# Patient Record
Sex: Male | Born: 1955
Health system: Southern US, Community
[De-identification: ages and names within clinical notes are randomized; demographics above are authoritative.]

## PROBLEM LIST (undated history)

## (undated) DIAGNOSIS — B059 Measles without complication: Secondary | ICD-10-CM

## (undated) DIAGNOSIS — E785 Hyperlipidemia, unspecified: Secondary | ICD-10-CM

## (undated) DIAGNOSIS — G610 Guillain-Barre syndrome: Secondary | ICD-10-CM

## (undated) HISTORY — PX: WRIST RECONSTRUCTION: SHX2675

## (undated) HISTORY — PX: ANKLE RECONSTRUCTION: SHX1151

## (undated) HISTORY — PX: NOSE SURGERY: SHX723

## (undated) HISTORY — PX: CHOLECYSTECTOMY: SHX55

## (undated) HISTORY — DX: Hyperlipidemia, unspecified: E78.5

## (undated) HISTORY — DX: Measles without complication: B05.9

---

## 1998-03-04 ENCOUNTER — Emergency Department (HOSPITAL_COMMUNITY): Admission: EM | Admit: 1998-03-04 | Discharge: 1998-03-04 | Payer: Self-pay | Admitting: Emergency Medicine

## 1998-03-06 ENCOUNTER — Emergency Department (HOSPITAL_COMMUNITY): Admission: EM | Admit: 1998-03-06 | Discharge: 1998-03-06 | Payer: Self-pay | Admitting: Emergency Medicine

## 2000-01-17 ENCOUNTER — Encounter: Payer: Self-pay | Admitting: Internal Medicine

## 2000-01-17 ENCOUNTER — Inpatient Hospital Stay (HOSPITAL_COMMUNITY): Admission: EM | Admit: 2000-01-17 | Discharge: 2000-01-22 | Payer: Self-pay | Admitting: Emergency Medicine

## 2000-01-17 ENCOUNTER — Encounter: Payer: Self-pay | Admitting: *Deleted

## 2000-01-19 ENCOUNTER — Encounter: Payer: Self-pay | Admitting: General Surgery

## 2000-01-21 ENCOUNTER — Encounter: Payer: Self-pay | Admitting: Gastroenterology

## 2004-06-30 ENCOUNTER — Emergency Department (HOSPITAL_COMMUNITY): Admission: EM | Admit: 2004-06-30 | Discharge: 2004-06-30 | Payer: Self-pay | Admitting: Family Medicine

## 2007-02-25 ENCOUNTER — Emergency Department (HOSPITAL_COMMUNITY): Admission: EM | Admit: 2007-02-25 | Discharge: 2007-02-25 | Payer: Self-pay | Admitting: Emergency Medicine

## 2008-03-23 ENCOUNTER — Emergency Department (HOSPITAL_COMMUNITY): Admission: EM | Admit: 2008-03-23 | Discharge: 2008-03-23 | Payer: Self-pay | Admitting: Emergency Medicine
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
## 2008-03-26 ENCOUNTER — Ambulatory Visit (HOSPITAL_COMMUNITY): Admission: RE | Admit: 2008-03-26 | Discharge: 2008-03-26 | Payer: Self-pay | Admitting: Orthopedic Surgery
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
## 2008-09-28 ENCOUNTER — Inpatient Hospital Stay (HOSPITAL_COMMUNITY): Admission: EM | Admit: 2008-09-28 | Discharge: 2008-09-30 | Payer: Self-pay | Admitting: Emergency Medicine

## 2008-09-28 IMAGING — CR DG TIBIA/FIBULA 2V*R*
4 series · 4 of 4 positions shown · non-contrast
Comparison: None

CLINICAL DATA: Fall, leg injury

RIGHT TIBIA AND FIBULA - 2 VIEW

[t tib/fib ap right (1 of 3)]
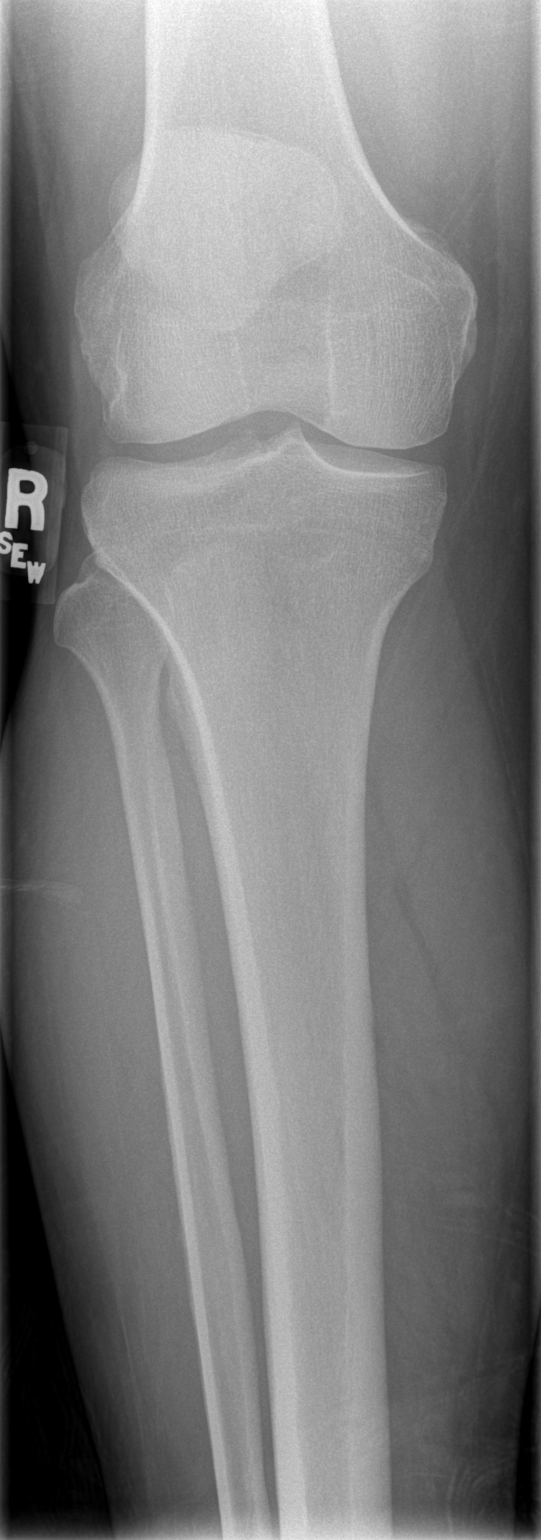

[t tib/fib ap right (2 of 3)]
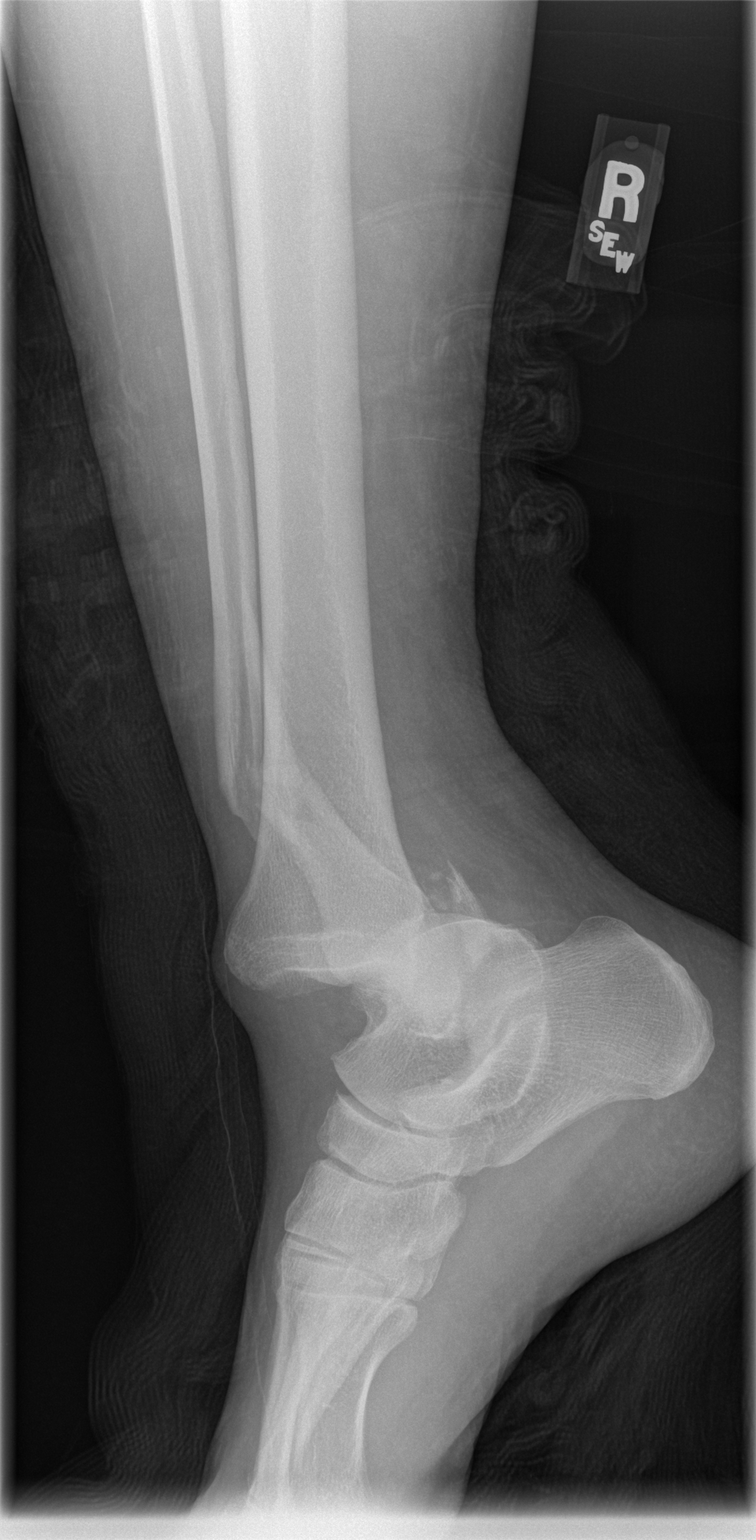

[t tib/fib ap right (3 of 3)]
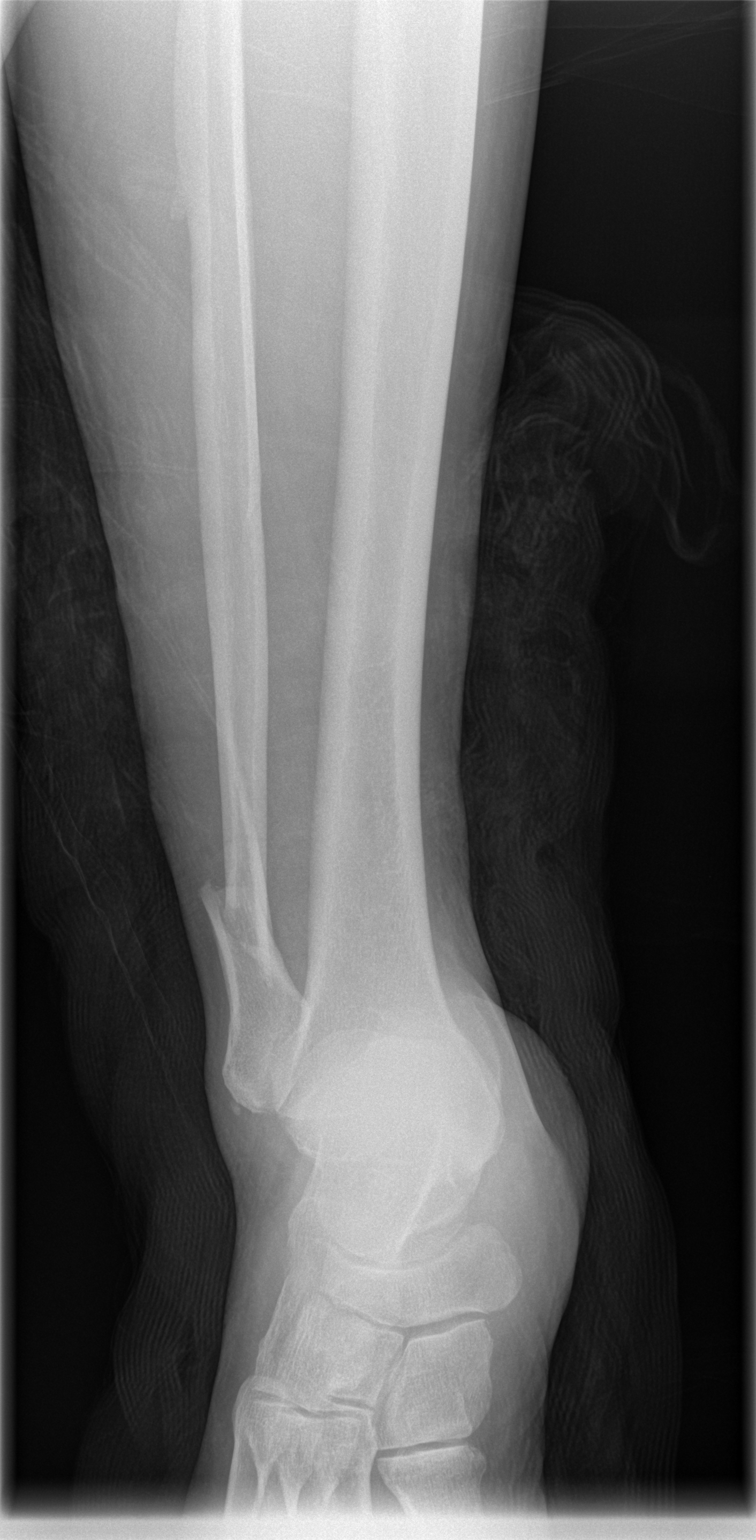

[t tib/fib lat right]
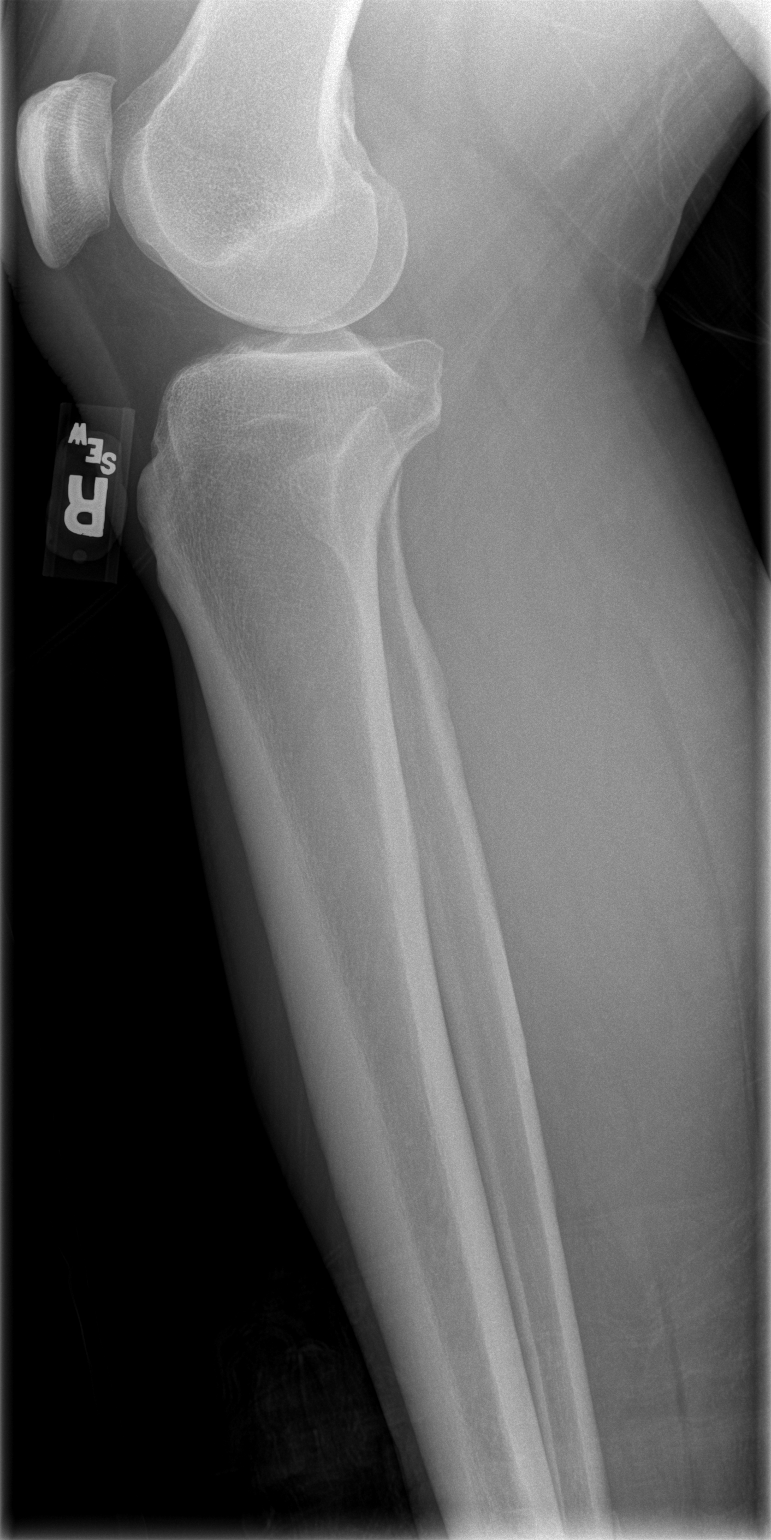

[4 of 4 positions shown; findings below may reference images not displayed]

FINDINGS: Knee joint alignment normal.
Fracture dislocation right ankle.
Displaced posterior malleolar fracture.
Oblique distal fibular diaphyseal fracture with apex anterior and
lateral angulation.
Medial malleolus suboptimally visualized, no gross fracture seen.
Posterior talar dislocation.
Proximal tibia and fibula intact.
IMPRESSION: Distal fibular and posterior malleolar fractures with posterior
dislocation of talus.
No definite medial malleolar fracture is identified, though medial
malleolus is poorly profiled/visualized on these images.

## 2008-09-28 IMAGING — CR DG ANKLE COMPLETE 3+V*R*
2 series · 2 of 2 positions shown · non-contrast
Comparison: Earlier exam of [DATE]

CLINICAL DATA: Post reduction ankle injury

RIGHT ANKLE - COMPLETE 2 VIEWS

[view not recorded (1 of 2)]
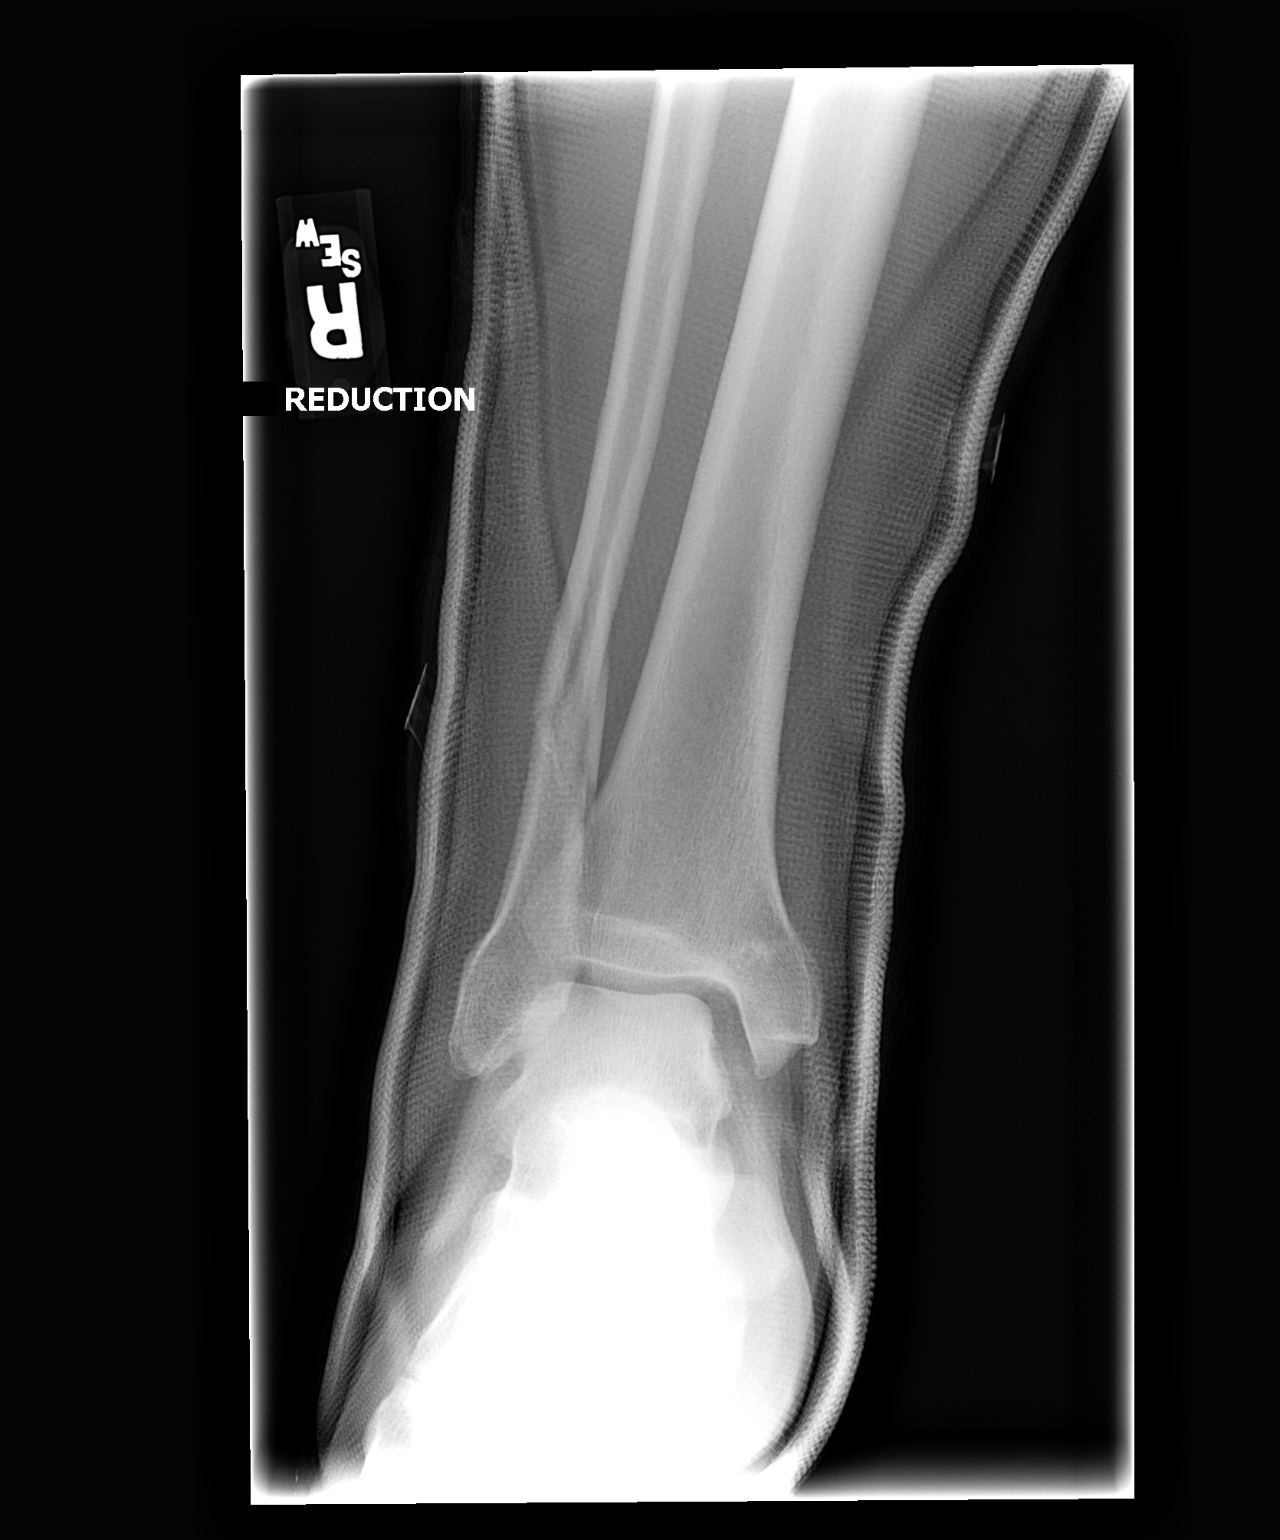

[view not recorded (2 of 2)]
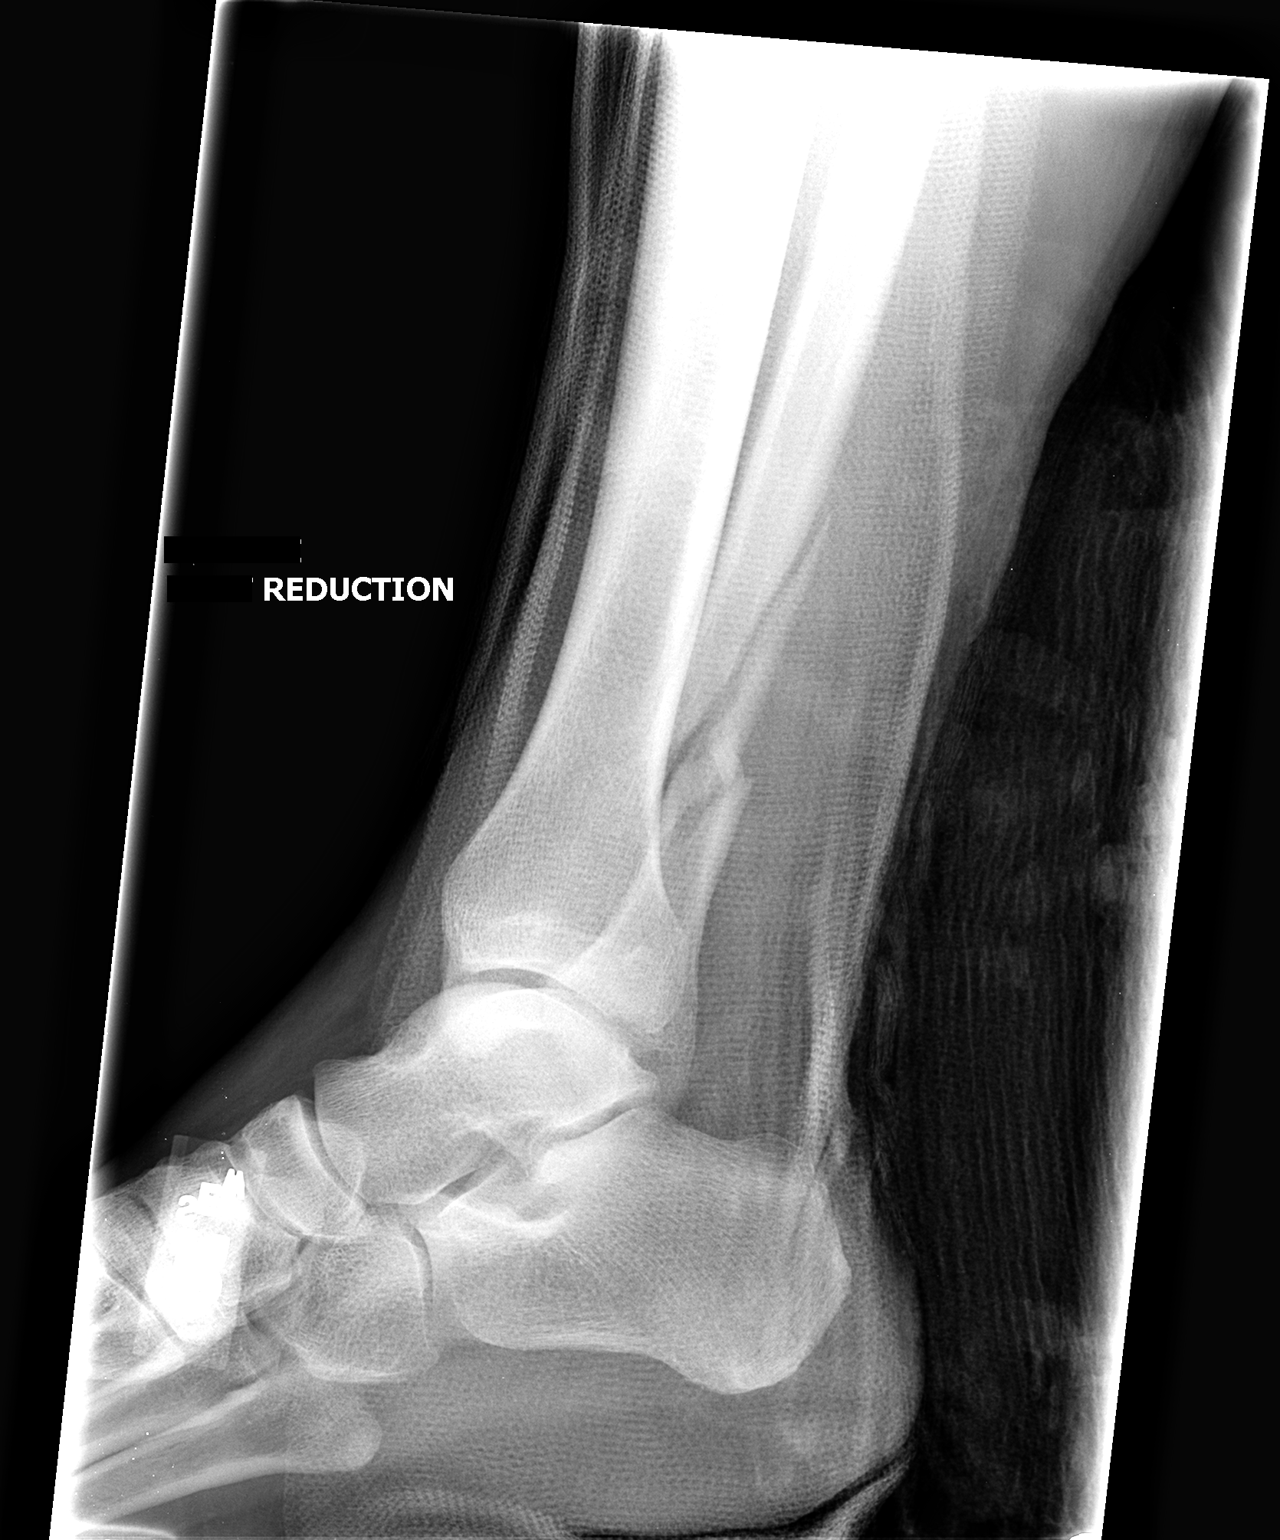

[2 of 2 positions shown; findings below may reference images not displayed]

FINDINGS: Fiberglass splint material obscures bony detail.
Tibiotalar dislocation reduced.
Improved alignment of comminuted distal fibular diaphyseal fracture
with slight residual posterior displacement.
Widening of medial ankle mortise.
No definite medial malleolar fracture.
No posterior malleolar fracture fragment seen on this exam.
IMPRESSION: Reduction of tibiotalar dislocation with significantly improved
alignment of distal right fibular fracture.

## 2008-09-28 IMAGING — CR DG ANKLE COMPLETE 3+V*R*
2 series · 2 of 2 positions shown · non-contrast
Comparison: None

CLINICAL DATA: Injury, fall

RIGHT ANKLE - COMPLETE 3+ VIEW

[t ankle joint ap right]
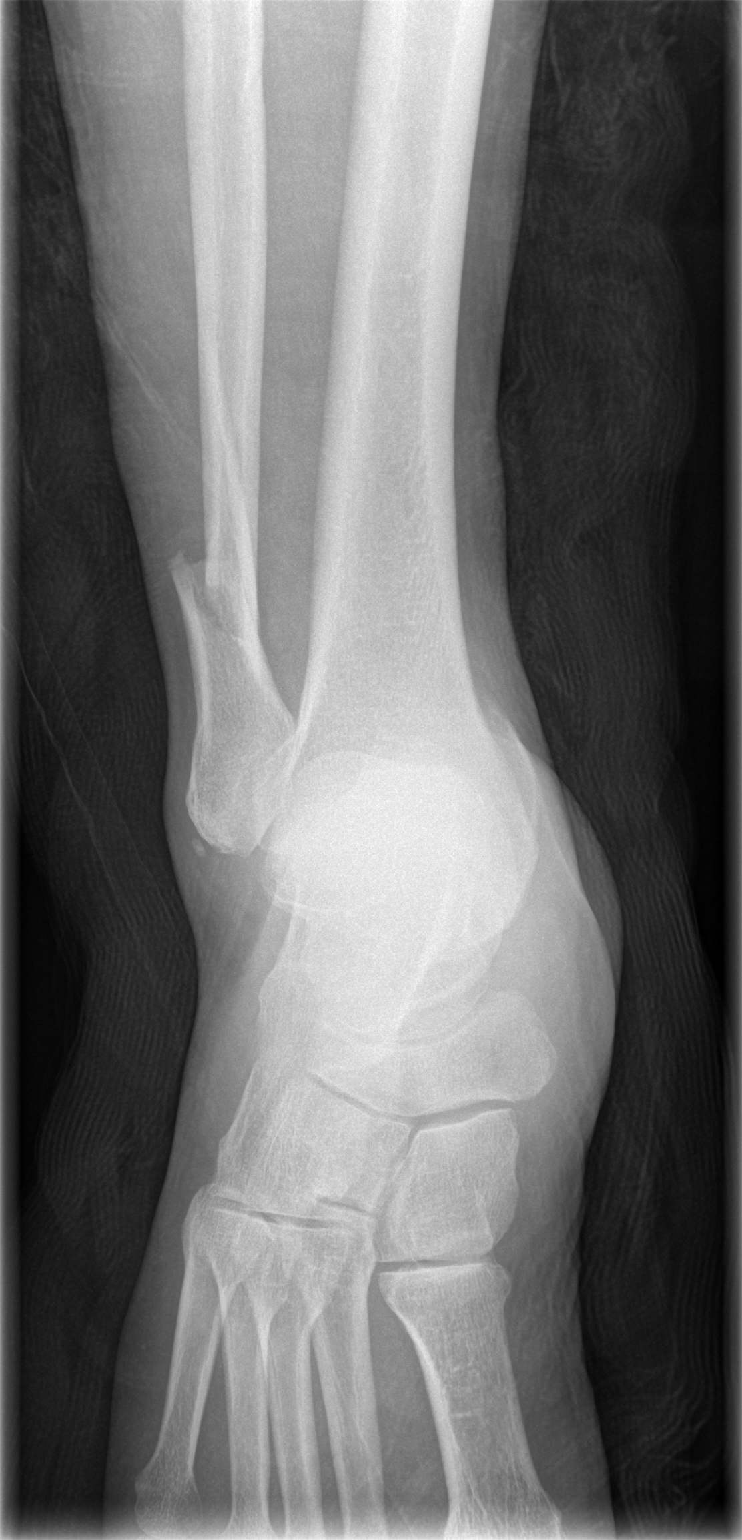

[t ankle joint lat right]
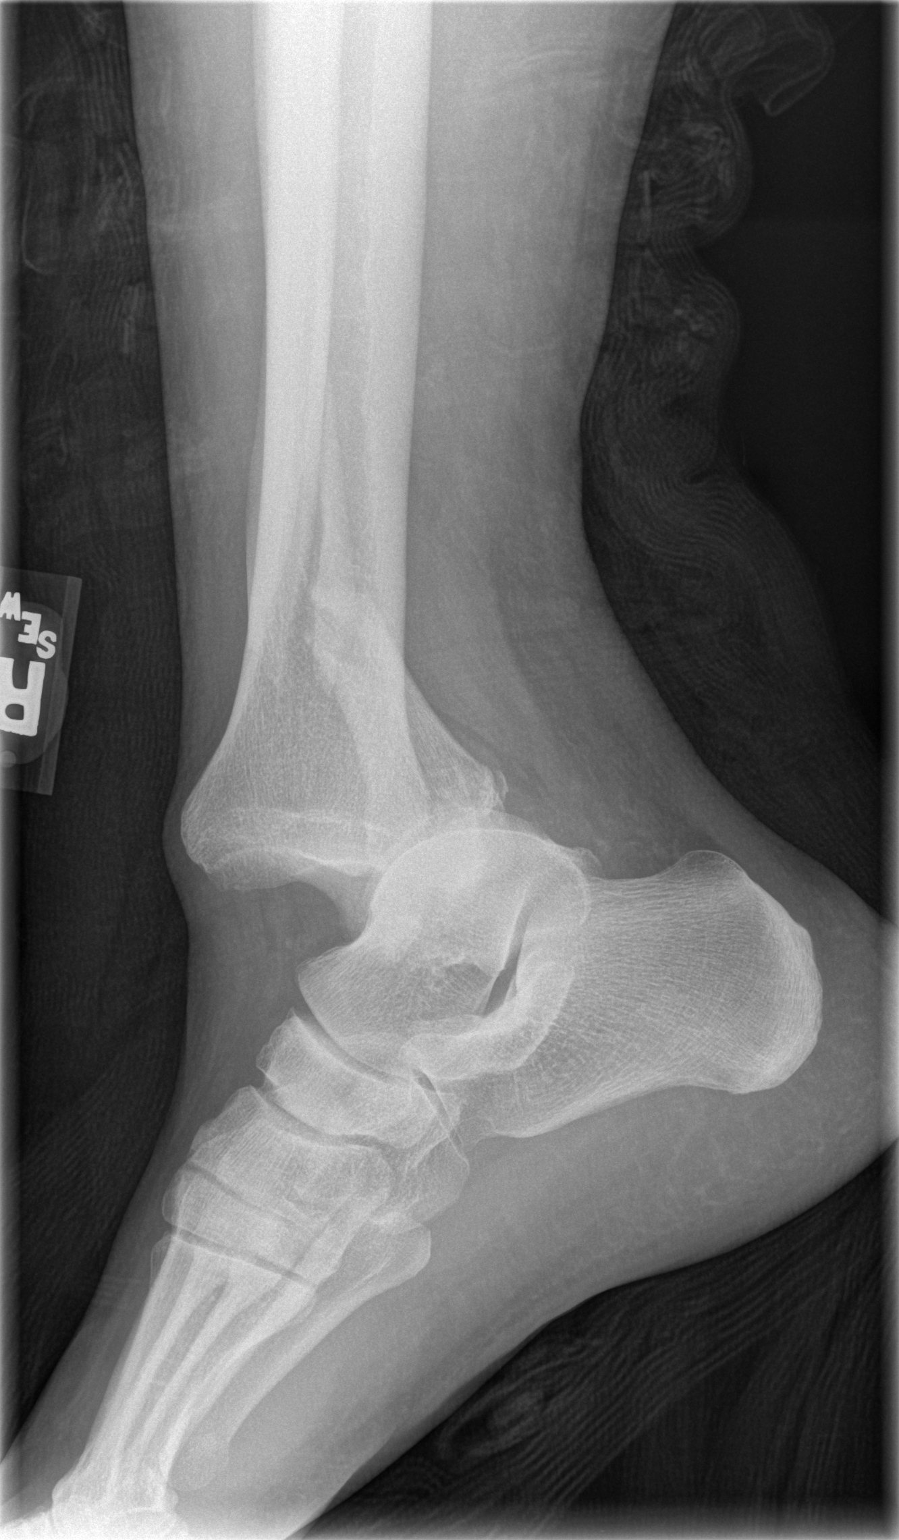

[2 of 2 positions shown; findings below may reference images not displayed]

FINDINGS: Mildly displaced distal fibular diaphyseal fracture with apex
anterior and lateral angulation.
Displaced posterior malleolar fracture fragment better visualized
on preceding right tibial and fibular radiographs.
Medial malleolus grossly intact.
Posterior talar dislocation.
No tarsal fracture evident.
IMPRESSION: Angulated displaced distal fibular diaphyseal fracture.
Posterior malleolar fracture.
Posterior talar dislocation.

## 2008-10-06 ENCOUNTER — Ambulatory Visit (HOSPITAL_COMMUNITY): Admission: RE | Admit: 2008-10-06 | Discharge: 2008-10-07 | Payer: Self-pay | Admitting: Orthopedic Surgery

## 2009-04-20 ENCOUNTER — Emergency Department (HOSPITAL_COMMUNITY): Admission: EM | Admit: 2009-04-20 | Discharge: 2009-04-20 | Payer: Self-pay | Admitting: Family Medicine

## 2010-12-04 LAB — WOUND CULTURE

## 2010-12-13 LAB — DIFFERENTIAL
Basophils Absolute: 0.1 10*3/uL (ref 0.0–0.1)
Basophils Relative: 1 % (ref 0–1)
Eosinophils Absolute: 0.2 10*3/uL (ref 0.0–0.7)
Neutro Abs: 6.9 10*3/uL (ref 1.7–7.7)
Neutrophils Relative %: 69 % (ref 43–77)

## 2010-12-13 LAB — BASIC METABOLIC PANEL
BUN: 16 mg/dL (ref 6–23)
CO2: 25 mEq/L (ref 19–32)
Calcium: 8.7 mg/dL (ref 8.4–10.5)
Chloride: 105 mEq/L (ref 96–112)
Creatinine, Ser: 0.94 mg/dL (ref 0.4–1.5)
GFR calc Af Amer: >60 mL/min (ref >60)

## 2010-12-13 LAB — CBC
MCHC: 33.8 g/dL (ref 30.0–36.0)
MCV: 91.7 fL (ref 78.0–100.0)
Platelets: 231 10*3/uL (ref 150–400)

## 2010-12-14 LAB — CBC
Hemoglobin: 15.3 g/dL (ref 13.0–17.0)
RBC: 4.81 MIL/uL (ref 4.22–5.81)
RDW: 13.6 % (ref 11.5–15.5)
WBC: 11.6 10*3/uL — ABNORMAL HIGH (ref 4.0–10.5)

## 2010-12-14 LAB — BASIC METABOLIC PANEL
Calcium: 9.9 mg/dL (ref 8.4–10.5)
GFR calc Af Amer: >60 mL/min (ref >60)
GFR calc non Af Amer: >60 mL/min (ref >60)
Potassium: 4.7 mEq/L (ref 3.5–5.1)
Sodium: 138 mEq/L (ref 135–145)

## 2010-12-14 LAB — DIFFERENTIAL
Basophils Absolute: 0 10*3/uL (ref 0.0–0.1)
Lymphocytes Relative: 21 % (ref 12–46)
Lymphs Abs: 2.4 10*3/uL (ref 0.7–4.0)
Monocytes Absolute: 1.1 10*3/uL — ABNORMAL HIGH (ref 0.1–1.0)
Neutro Abs: 7.7 10*3/uL (ref 1.7–7.7)

## 2010-12-14 LAB — URINALYSIS, ROUTINE W REFLEX MICROSCOPIC
Ketones, ur: NEGATIVE mg/dL
Nitrite: NEGATIVE
Protein, ur: NEGATIVE mg/dL
pH: 5.5 (ref 5.0–8.0)

## 2010-12-14 LAB — APTT: aPTT: 31 seconds (ref 24–37)

## 2011-01-11 NOTE — H&P (Signed)
NAMESTEPHENSON, CICHY                ACCOUNT NO.:  [id number]   MEDICAL RECORD NO.:  [id number]          PATIENT TYPE:  INP   LOCATION:  5031                         FACILITY:  [facility]   PHYSICIAN:  Alvy Beal, MD    DATE OF BIRTH:  1956/02/14   DATE OF ADMISSION:  09/28/2008  DATE OF DISCHARGE:                              HISTORY & PHYSICAL   ADMITTING DIAGNOSIS:  Right ankle fracture dislocation.   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  He was brought to the hospital  with complaints of significant pain, deformity, and inability to weight  bear on the right lower extremity.   After appropriate workup, x-rays confirmed an ankle fracture dislocation  trimalleolar with a Weber C type fracture.  It should be noted this was  a closed injury.  After appropriate station was provided, closed  reduction maneuver and application of the U splint was done by the ER  staff with satisfactory reduction on postreduction films.  As a result,  the orthopedic consultation was requested.   At this point in time, the patient is in the ER room.  He is comfortable  with the U splint applied.   His past medical, surgical, family, social history is a cholecystectomy,  nonsmoker, nondrinker, nondrug abuser, no known drug allergies.  He does  have a past medical history of Guillain-Barre syndrome.  He is on no  current medications.   CLINICAL EXAM:  He is a pleasant gentleman who appears his stated age in  no acute distress.  He is alert and oriented x3.  He has no shortness of  breath or chest pain.  Abdomen is soft and nontender.  Cranial nerves II  through XII were tested, they are intact.  He is neurologically intact.  He is moving all toes.  Capillary refill is less than 2 seconds in all  digits.  He has got no significant pain with passive range of motion of  the toes.  Compartments are soft to palpation.   At this  point in time, a posterior splint was applied in order to try  the adequate support and keep the foot in a neutral position.  Because  of his home situation and his pain, I will plan on just admitting him  for observation and pain control.  I will discuss with my partner  definitive fracture management.       Alvy Beal, MD  Electronically Signed     DDB/MEDQ  D:  09/28/2008  T:  09/28/2008  Job:  [id number]

## 2011-01-11 NOTE — Discharge Summary (Signed)
Michael Khan, Michael Khan                ACCOUNT NO.:  [id number]   MEDICAL RECORD NO.:  [id number]          PATIENT TYPE:  INP   LOCATION:  5031                         FACILITY:  [facility]   PHYSICIAN:  Almedia Balls. Ranell Patrick, M.D. DATE OF BIRTH:  Sep 23, 1955   DATE OF ADMISSION:  09/28/2008  DATE OF DISCHARGE:  09/30/2008                               DISCHARGE SUMMARY   ADMISSION DIAGNOSIS:  Right ankle fracture and dislocation.   DISCHARGE DIAGNOSIS:  Right ankle fracture and dislocation.   BRIEF HISTORY:  The patient is a 55 year old male, who slipped on the  ice, injuring his right foot and ankle.  The patient was admitted in the  emergency room after having closed reduction by the emergency room  physician and placed into a splint.  The patient was kept for surgical  management over the weekend.   HOSPITAL COURSE:  The patient was admitted on September 28, 2008, for the  above.  After having a closed reduction by the ER physician, the patient  was transferred to Dr. Ranell Patrick' care on September 29, 2008, but due to the  amount of swelling and edema in his foot, surgery was postponed until  the edema could resolve.  Thus the patient will be discharged home after  physical therapy on September 30, 2008, with a new well-padded splint and  nonweightbearing.   DISCHARGE/PLAN:  The patient will be discharged home on September 30, 2008, with followup in about a week for surgical management of his ankle  fracture.  His condition is stable.  His diet is regular.   This patient has no known drug allergies.   DISCHARGE MEDICATIONS:  1. Percocet 5/325 one to two tablets q.4-6 h. p.r.n. pain.  2. Robaxin 500 mg p.o. q.6 h. p.r.n. spasm.  3. Aspirin 81 mg p.o. daily.   His condition is stable.      Thomas B. Durwin Nora, P.A.      Almedia Balls. Ranell Patrick, M.D.  Electronically Signed    TBD/MEDQ  D:  09/30/2008  T:  10/01/2008  Job:  [id number]

## 2011-01-11 NOTE — Op Note (Signed)
NAME:  Michael Khan, Michael Khan                ACCOUNT NO.:  [id number]   MEDICAL RECORD NO.:  [id number]          PATIENT TYPE:  OIB   LOCATION:  5014                         FACILITY:  [facility]   PHYSICIAN:  Almedia Balls. Ranell Patrick, M.D. DATE OF BIRTH:  Aug 03, 1956   DATE OF PROCEDURE:  10/06/2008  DATE OF DISCHARGE:                               OPERATIVE REPORT   PREOPERATIVE DIAGNOSIS:  Right ankle fracture, displaced.   POSTOPERATIVE DIAGNOSIS:  Right ankle fracture, displaced.   PROCEDURE PERFORMED:  Open reduction and internal fixation of right  ankle fracture.   ATTENDING SURGEON:  Almedia Balls. Ranell Patrick, MD   ASSISTANT:  Donnie Coffin. Dixon, PA-C.   ANESTHESIA:  General anesthesia was used plus a perineal block and  popliteal block.   FLUID REPLACEMENT:  1000 mL crystalloid.   INSTRUMENT COUNT:  Correct.   COMPLICATIONS:  There were no complications.   Perioperative antibiotics were given.   INDICATIONS:  The patient is a 55 year old male status post right ankle  fracture dislocation.  The patient presented to the emergency department  with an obviously dislocated ankle and lateral malleolus comminuted  fracture.  The patient had a reduction done in the emergency department.  Due to swelling, the patient was discharged to home in a well-padded  Jones splint with strict orders to elevate his ankle.  He re-presents  today for staged open reduction and internal fixation.  Prior to  surgery, his skin was checked and there was noted to be significant  decrease in swelling.  No signs of any blisters over the surgical area.  At this point, informed consent was obtained.   DESCRIPTION OF PROCEDURE:  After an adequate level of anesthesia was  achieved, the patient was positioned supine on the operating room table.  Nonsterile tourniquet was placed on the right proximal thigh.  The right  leg was sterilely prepped and draped in the usual manner.  After  exsanguination of the limb using Esmarch bandage,  we elevated the  tourniquet to 350 mmHg.  Longitudinal lateral incision was created over  the subcutaneous fibula.  Dissection was carried sharply down through  the subcutaneous tissues.  We identified the fracture site and then  performed subperiosteal dissection of the fibula.  There was a  comminuted fracture.  We did a bridge plating with a contoured recon  plate.  This plate was fixed to the lateral side of the fibula and  secured distally with 2 locking and 1 nonlocking screw and then  proximally with 2 nonlocking and 2 locking screws getting excellent  fracture alignment in both the AP and lateral planes.  Mortise was  reduced.  We performed a Cotton's test through pulling on the fibula and  I did not see this gap under anesthesia.  There was no evidence of any  widening at all at the medial clear space.  Thus, it was decided that  the syndesmosis was intact.  At this point, we went ahead and placed two  #2 FiberWire sutures in a cerclage fashion __________ some of the  comminuted fragments posteriorly.  We are happy with  the  ankle mortise and the fracture reduction and the placement of hardware.  At this point, we thoroughly irrigated and closed using 2-0 Vicryl  subcutaneous closure and staples.  Sterile compressive bandage and short  leg splint were applied.  The patient tolerated the surgery well.      Almedia Balls. Ranell Patrick, M.D.  Electronically Signed     SRN/MEDQ  D:  10/06/2008  T:  10/07/2008  Job:  [id number]

## 2011-01-11 NOTE — Discharge Summary (Signed)
NAMELUCCA, BALLO                ACCOUNT NO.:  [id number]   MEDICAL RECORD NO.:  [id number]          PATIENT TYPE:  INP   LOCATION:  5031                         FACILITY:  [facility]   PHYSICIAN:  Almedia Balls. Ranell Patrick, M.D. DATE OF BIRTH:  1956-04-15   DATE OF ADMISSION:  09/28/2008  DATE OF DISCHARGE:  09/30/2008                               DISCHARGE SUMMARY   ADMITTING DIAGNOSIS:  Right ankle fracture dislocation.   DISCHARGE DIAGNOSIS:  Right ankle fracture dislocation.   PROCEDURE PERFORMED:  Closed reduction of ankle fracture dislocation in  the emergency department, that was performed on September 28, 2008 and  then on September 29, 2008.  The patient had another procedure which was a  splint changed to a Jones type L and U by Dr. Ranell Patrick and Alphonsa Overall.   HISTORY OF PRESENT ILLNESS:  The patient is a 55 year old male who had a  twisting injury to his right ankle and he presented to the emergency  room with a grossly displaced fracture dislocation, right ankle.  The  patient was assessed and successfully reduced by the emergency  department personnel.  He was placed in a prefab splint and was admitted  for pain control and definitive care by Dr. Venita Lick.  For further  details on the patient's past medical history and physical examination,  please see the medical record.   HOSPITAL COURSE:  The patient was admitted to the Orthopedic Service by  Dr. Shon Baton on September 28, 2008 with a diagnosis of ankle fracture  dislocation.  Post-reduction x-rays demonstrated appropriate alignment  of the fracture and ankle mortise.  The patient was subsequently  referred to Dr. Ranell Patrick of [facility].  The patient's splint  was taken down and the skin was checked.  There was one area of pressure  from the splints, so his splint was completely changed to a well-padded  Jones-type splint.  There was moderate swelling as well.  There was also  a heavy plaster splint applied around the  bulky Jones splint to protect  his skin.  We instructed him on strict elevation and nonweightbearing.  He is not quite ready for surgery based on swelling and the pressure  area to the anterior aspect of his ankle.  The patient was in agreement  with this.  He will be discharged on September 30, 2008, strict  nonweightbearing.  He is being instructed on September 30, 2008 by  Physical Therapy for nonweightbearing with a walker and he also doing  some stair training today.  He is appropriately comfortable on oral  medications including Percocet and Robaxin.  He will be following up  with Orthopedics on Thursday, which is October 02, 2008 for planning  purposes for surgical management.      Almedia Balls. Ranell Patrick, M.D.  Electronically Signed     SRN/MEDQ  D:  09/30/2008  T:  09/30/2008  Job:  [id number]

## 2011-01-11 NOTE — Op Note (Signed)
NAME:  Michael Khan, Michael Khan                ACCOUNT NO.:  [id number]   MEDICAL RECORD NO.:  [id number]          PATIENT TYPE:  AMB   LOCATION:                               FACILITY:  [facility]   PHYSICIAN:  Nadara Mustard, MD     DATE OF BIRTH:  10-20-1955   DATE OF PROCEDURE:  03/26/2008  DATE OF DISCHARGE:  03/26/2008                               OPERATIVE REPORT   PREOPERATIVE DIAGNOSIS:  Intra-articular left distal radius fracture.   POSTOPERATIVE DIAGNOSIS:  Intra-articular left distal radius fracture.   PROCEDURE:  Open reduction and internal fixation, left distal radius.   SURGEON:  Nadara Mustard, MD   ANESTHESIA:  General.   ESTIMATED BLOOD LOSS:  Minimal.   ANTIBIOTICS:  1 gram of Kefzol.   DRAINS:  None.   COMPLICATIONS:  None.   TOURNIQUET TIME:  None.   DISPOSITION:  To PACU in stable condition.   INDICATIONS FOR PROCEDURE:  The patient is a 56 year old gentleman with  an intra-articular left distal radius fracture with malalignment.  The  patient has been splinted and wished to proceed with surgical  intervention at this time.  Risks and benefits were discussed including  infection, neurovascular injury, persistent pain, loss of reduction,  arthritis, need for additional surgery.  The patient states he  understands and wished to proceed at this time.   DESCRIPTION OF PROCEDURE:  The patient was brought to OR room 1 and  underwent a general anesthetic.  After adequate level of anesthesia was  obtained, the patient's left upper extremity was prepped using DuraPrep  and draped into a sterile field.  A volar extensile approach of Sherilyn Cooter  was used.  This was carried down to the FCR sheath which was incised on  the volar aspect and also on the dorsal aspect of the sheath.  The  radial vascular bundle was retracted radially, and the FCR was retracted  ulnarly.  The quadratus was elevated off the distal radius.  There was 3  large fragments which were cleansed at the  fracture site and reduced.  Using a Synthes volar plate, this was first stabilized using the lag  screw.  The plate was aligned, and the fracture was reduced.  Using  locking screws, locking screws were placed into the distal fragments as  well as proximally.  There was still not complete stable fixation of the  radial styloid fragment, and two 1.6-mm percutaneous pins were placed  into the radial styloid to provide further fixation of the radial  styloid fragment.  C-arm fluoroscopy verified reduction in both AP and  lateral planes.  The wound was irrigated with normal saline.  The subcu  was closed using 2-0 Vicryl.  The skin was closed using  approximate staples.  The wound was covered Adaptic, orthopedic sponges,  sterile Webril, and Coban.  The patient was extubated and taken to the  PACU in stable condition.  Planned for discharged to home.  Followup in  the office in 2 weeks.  Prescription for Tylox for pain.      Nadara Mustard, MD  Electronically Signed     MVD/MEDQ  D:  03/26/2008  T:  03/27/2008  Job:  [id number]

## 2011-01-14 NOTE — Discharge Summary (Signed)
. [facility]  Patient:    Michael Khan, Michael Khan                       MRN: [id number] Adm. Date:  [id number] Disc. Date: [id number] Attending:  Edwyna Perfect Dictator:   Gwenlyn Found, M.D. CC:         Dr. ________/_______, Sela Hilding D. Arlyce Dice, M.D. [facility]             Adolph Pollack, M.D.             [facility]                           Discharge Summary  DISCHARGE DIAGNOSES: 1. Peribiliary pancreatitis. 2. Pericholelithiasis status post laparoscopic cholecystectomy on 01/20/00. 3. Guillain-Barre syndrome. 4. Hypertension. 5. Restless leg syndrome. 6. Hypothyroidism. 7. Postoperative fever.  CONSULTATIONS: Dr. Avel Peace Dr. Melvia Heaps.  PROCEDURES: 1. CT of the abdomen with contrast media performed on 01/17/00; this examination showed mild-to-moderately distended gallbladder without evidence by CT scan for  cholelithiasis or gallbladder wall thickening; an ultrasound was recommended. There was a 1.2 cm ______ simple-appearing left renal cyst and a normal-appearing pancreas and appendix.  2. Abdominal ultrasound on 01/17/00 showed cholelithiasis with innumerable tiny  gallstones present within the gallbladder.  There was a positive sonographic Murphys sign during the examination.  There was no biliary ductal dilatation. There was an apparent abnormality of the midleft kidney that was believed to be  related to a fetal lobulation as noted on previous CT, performed that day. There were no renal masses identified on the CT examination apart from a 1 cm cyst in the upper pole in the left kidney.  The pancreas was obscured by overlying bowel gas. 3. Laparoscopic cholecystectomy and intraoperative cholangiogram on 01/20/00.  4. ERCP on 01/21/00.  DISCHARGE MEDICATIONS: 1. Hydrochlorothiazide 25 mg p.o. q.d. 2. Claritin 10 mg p.o. q.d. 3. Neurontin 300 mg p.o. q.h.s. 4. Synthroid 50 mcg p.o. q.d. 5.  Covera-HS 240 mg p.o. q.d. 6. Tegretol 200 mg p.o. b.i.d. 7. Tylox one to two p.o. q.4h. p.r.n. pain. 8. Augmentin 875 mg p.o. b.i.d. x 7 days.  DISCHARGE INSTRUCTIONS:  The patient may shower the day after his surgery.  He s to pat the puncture wounds dry, and not rub them.  He is rest on the day of discharge.  The patient may be out of bed, walking.  May resume activities as tolerated.  The patient is not to do any heavy lifting greater than ten pounds or two weeks and that it is okay for him to drive in a few days when he is comfortable.  Diet: The patient is to resume his normal diet, which showed be a  low-salt diet, in light of his hypertension.  He is avoid any alcoholic intake while taking prescription pain medications.  The patient may return to work in -14 days or as instructed by Dr. Avel Peace.  The patient is to empty and record drain output b.i.d.  The patient is to call a physician regarding redness, drainage or swelling of the puncture wounds or for a temperature greater than 101 degrees F. or severe pain not relieved by non-aspirin products or prescribed pain medication.  CHIEF COMPLAINT:  Abdominal pain, nausea and vomiting.  HISTORY OF PRESENT ILLNESS:  Michael Khan is a  55 year old Native American male who reports he developed abdominal pain, nausea and vomiting on Mothers Day.  He thought it was something he ate.  The nausea and vomiting subsided but the abdominal pain persisted and increased on Thursday, 01/13/00.  This past Saturday, he did not have much pain, but did have some abdominal soreness.  Then, on the ay prior to admission, he developed severe pain that was both crampy and stabbing n nature, bilateral, and located near the rib cage.  He reports feeling hot, clammy and sweaty with emesis.  He vomited on the night prior to admission until the morning of admission.  He did notice a small amount of blood in his emesis.  He had has had no  diarrhea.  PAST MEDICAL HISTORY: 1. Hypothyroidism. 2. Guillain-Barre syndrome, diagnosed in 1999. 3. Hypertension, diagnosed in 1999. 4. Restless leg syndrome. 5. Allergic rhinitis. 6. Status post nasal septoplasty, remote.  ALLERGIES:  No known drug allergies.  MEDICATIONS: 1. Hydrochlorothiazide 25 mg p.o. q.d. 2. Synthroid 50 mcg p.o. q.d. 3. Covera-HS 240 mg p.o. q.d. 4. Claritin 10 mg p.o. q.d. 5. Neurontin 300 mg p.o. q.h.s. 6. Tegretol 200 mg p.o. b.i.d.  SOCIAL HISTORY:  The patient lives in [place], with his wife and two children. He is employed by [employer] and is not currently working; he last worked in 1999, prior to his Guillain-Barre syndrome episode.  Tobacco:  None.  Alcohol:  None. Drugs: None now, but does have remote marijuana use history.  FAMILY HISTORY:  Mother is living, age 66; is healthy.  Dad died in his 55s from an MI, but did not have diabetes or hypertension.   REVIEW OF SYSTEMS:  Positive for firm bowel movements, constipation, arthralgias, slight shortness of breath, restless legs and a cough productive of clear sputum every morning.  Negative for melena, hematochezia, dysuria, frequency, urgency r or headache.  PHYSICAL EXAMINATION:  GENERAL:  He is a well-nourished, well-developed Native American male in no acute distress, lying in bed surrounded by his family members.  VITAL SIGNS:  Temperature 98.1, blood pressure 132/84, pulse 58, respiratory rate 14.  SKIN:  Warm and dry.  NECK:  Supple, with no lymphadenopathy, JVD or thyromegaly.  HEENT:  Normocephalic, atraumatic, except there is some right eyelid closing during of the history and physical examination.  Pupils are small, but equal, round and reactive to light and accommodation; EOMI.  Sclerae are anicteric.  Nares are patent.  Oropharynx shows no erythema.  LUNGS:  Clear to auscultation bilaterally.  HEART:  Bradycardic, with normal S1 and S2.   ABDOMEN:  Soft,  with mild tenderness in the bilateral lower quadrants and moderate tenderness in the bilateral upper quadrants with a negative Murphys exam by my examination after pain medications, but the patient did have a positive Murphys  sign per the second year Resident who examined him before the delivery of pain meds.  The patient has no hepatosplenomegaly.  EXTREMITIES:  No edema, 1+ dorsalis pedis and posterior tibialis pulses.  NEUROLOGIC:  Cranial nerves II-XII are grossly intact.  Motor is 5/5 with the exception of hand grip, which is 4+/5.  LABORATORY:  WBC 12.7, hemoglobin 15.2, ANC 7.0, platelets 367,000.  Sodium 136, potassium 2.8, chloride 98, CO2 was 29, BUN 24, creatinine 1.1, glucose 139, SGOT 175, SGPT 126, Total protein 8.5, alk phos 145, Total bilirubin 0.8, calcium 9.9, albumin 4.1, lipase 431, amylase 448.  Urinalysis was yellow and clear with a few squamous epithelial cells and a few  bacteria with 0-5 rbcs and wbcs.  CT scan of abdominal shunt:  Results as previously dictated.  ASSESSMENT:  Michael Khan is a 55 year old male with a history Guillain-Barre syndrome and hypertension, who presents with severe abdominal pain with an elevated amylase and lipase and LFTs.  The differential diagnosis includes biliary pancreatitis and cholecystitis and pain ______ secondary to his medications, particularly the hydrochlorothiazide.  The patient had been on HCTZ for quite some period of time, and thus, this was not likely the culprit.  Nevertheless, it was held just in case.  Because the patients ultrasound showed multiple/innumerable  gallstones, the presumptive diagnosis of biliary pancreatitis was made.  The patient was admitted for pain medications, further work-up and surgical consultation.  HOSPITAL COURSE:  The patient was admitted to the medicine teaching service under the direction of Dr. Marin Roberts.  The patient was initially made NPO, and IV analgesics  were given.  The patient tolerated IV analgesics without problem and on 01/18/00, Dr. Avel Peace was contacted for general surgery consultation.  Of note:  On 01/18/00, the patients bilirubin was noticed to jump from 0.8 to 1.2, and thus the possibility of obstruction was present.  The patients white count seemed to improve down to 10.7.  The patient symptomatically improved with pain  only with deep inspiration; the patient was afebrile.  The patients hypokalemia  was treated as indicated.  Surgical consultation was obtained by Dr. Abbey Chatters, who felt the patient should remain NPO, and should go for laparoscopic cholecystectomy with intraoperative cholangiogram, and thus, the patient went to the OR on 01/20/00.  During this procedure, Dr. Abbey Chatters found that he was unable to flush saline into the common bile duct, and thought there was possible obstruction.  The duct of Luschka was  seen and clipped.  A JP drain was left in the patient postoperatively, and the patient was returned to his room in stable condition.  Dr. Abbey Chatters felt it necessary to contact Dr. Melvia Heaps for ERCP, as the patients Total bilirubin had increased and he was unable to flush the common bile duct.  On 01/20/00, Dr. Melvia Heaps did see the patient in consultation and felt that ERCP was indicated, and on 01/21/00, the patient underwent ERCP that was negative for any evidence of obstruction.  Postoperatively, the patient continued to have fevers despite being on Ancef.  Finally, on 01/21/00, the patient was changed to Unasyn.  DISPOSITION:  On 01/22/00, the patient was felt to be stable and was discharged  home, on Augmentin, for one week.  The patient was to follow-up with Dr. Avel Peace on 01/26/00.  Thorough discharge instructions were given to the patient. The patient is to follow-up with Dr. Avel Peace in 01/26/00 and is to call him for an appointment. DD:   02/20/00 TD:  02/20/00 Job: [id number]

## 2011-01-14 NOTE — Consult Note (Signed)
[place]. [facility]  Patient:    Michael Khan, Michael Khan                       MRN: [id number] Proc. Date: 01/18/00 Adm. Date:  [id number] Attending:  Edwyna Perfect Dictator:   [id number] CC:         C. Ulyess Mort, M.D.                          Consultation Report  REASON FOR CONSULTATION: Abdominal pain, gallstones.  HISTORY OF PRESENT ILLNESS: This patient is a 55 year old male who while at a cookout for Mothers Day began developing epigastric right upper quadrant pressure and cramping type pain that radiated through to his back and was associated with some nausea and vomiting.  He did not seek attention at that time.  Over four days or so he got better; however, early in the morning of Jan 17, 2000 he had recurrence of the pain which was more severe.  He presented to the emergency department, where he was evaluated by the medical teaching service and found at that time to have signs and symptoms and chemical evidence of acute pancreatitis, and was admitted.  His amylase was elevated at 448 with a lipase of 531 and he had elevation of liver function tests.  He subsequently underwent a CT scan which did not demonstrate any acute inflammatory and infectious process.  An ultrasound demonstrated multiple small gallstones.  He has improved with some bowel rest and his amylase and lipase have normalized.  His liver functions are normalizing except for slight elevation of liver function tests.  He also is hypokalemic. I was asked to see him to evaluate him for possible biliary cause of his pancreatitis.  PAST MEDICAL HISTORY:  1. Guillain-Barre syndrome, resolved.  2. Hypertension.  3. Hypothyroidism.  PAST SURGICAL HISTORY: None.  ALLERGIES: No known drug allergies.  CURRENT MEDICATIONS:  1. Synthroid.  2. Neurontin.  3. Carbamazepine.  4. Potassium supplementation.  5. Morphine p.r.n.  6. Phenergan p.r.n.  SOCIAL HISTORY: Denies tobacco or alcohol use.   Works for [employer].  He is married.  REVIEW OF SYSTEMS: CARDIOVASCULAR: No known coronary artery disease or dysrhythmia.  PULMONARY: No asthma, COPD, or TB.  GI: No hepatitis, jaundice, peptic ulcer disease.  ENDOCRINE: No diabetes.  PHYSICAL EXAMINATION:  GENERAL: Well-developed, well-nourished male, in no acute distress, pleasant and cooperative.  VITAL SIGNS: He is afebrile.  HEENT: EOMI.  Sclerae clear.  NECK: Supple without palpable mass.  ABDOMEN: Soft with moderate epigastric and right upper quadrant tenderness to palpation.  There are no palpable masses noted.  IMPRESSION: Abdominal pain and pancreatitis, etiology most likely biliary pancreatitis.  It appears to be improving somewhat although his total bilirubin is slightly elevated today versus admission level of 1.2.  RECOMMENDATIONS: Would keep NPO except for ice chips and medications with sips of water right now.  If his total bilirubin does not continue to rise I think that he could be given a clear liquid diet tomorrow and then have a laparoscopic cholecystectomy on Thursday.  If the bilirubin and liver function tests are to rise he may need a preoperative ERCP.  I did explain the procedure of gallbladder removal and its risks including, but not limited to, bleeding, infection, risks of anesthetic, common bile duct injury, bile leak, and possible damage to internal abdominal organs.  The patient understands this and agrees to proceed.  DD:  01/18/00 TD:  01/18/00 Job: [id number]

## 2011-01-14 NOTE — Procedures (Signed)
[place]. [facility]  Patient:    Michael Khan, Michael Khan                       MRN: [id number] Proc. Date: 01/21/00 Adm. Date:  [id number] Disc. Date: [id number] Attending:  Edwyna Perfect CC:         C. Ulyess Mort, M.D.             Adolph Pollack, M.D.                           Procedure Report  HISTORY:  Mr. Noteboom is status post laparoscopic cholecystectomy.  He was admitted with gallstone pancreatitis.  An intraoperative cholangiogram was not done because of the inability to flush saline through the common bile duct suggesting a possible obstruction of the cystic duct.  INFORMED CONSENT:  Patient provided consent after the risks, benefits and alternatives were explained.  MEDICATIONS:  Versed 11, fentanyl 120, Robinul 0.2 mg IV.  PROCEDURE:  Patient was placed in the prone position, administered continuous low-flow oxygen, was placed on pulse oximetry.  Throat was sprayed with cetacaine spray.  The Olympus video duodenoscope was inserted blindly into the oropharynx and esophagus.  Scope was passed to the stomach and then on into the duodenum, where a normal-appearing papilla was identified.  Pancreatic duct and common bile duct were simultaneously filled with injection into the papilla.  Following this, a guidewire was placed directly into the common bile duct and the common bile duct was selectively cannulated.  FINDINGS: 1. Pancreatic duct was within normal limits. 2. Common bile duct, common hepatic duct and intrahepatic ducts were within normal limits.  Emptying of the contrast dye was normal.  IMPRESSION:  Normal ERCP. DD:  01/21/00 TD:  01/25/00 Job: [id number]

## 2011-01-14 NOTE — Discharge Summary (Signed)
. [facility]  Patient:    Michael Khan, Michael Khan                       MRN: [id number] Adm. Date:  [id number] Disc. Date: [id number] Attending:  Edwyna Perfect                           Discharge Summary  PREOPERATIVE DIAGNOSIS:  Biliary pancreatitis.  POSTOPERATIVE DIAGNOSIS:  Biliary pancreatitis.  PROCEDURE:  Laparoscopic cholecystectomy and attempted intraoperative cholangiogram.  SURGEON:  Adolph Pollack, M.D.  ASSISTANT:  Angelia Mould. Derrell Lolling, M.D.  INDICATIONS:  This is a 55 year old male, who presented to the medical service with abdominal pain.  He was noted to have elevation of amylase and lipase. Work-up for a gallbladder, pancreatitis etiology included an ultrasound, which demonstrated ______ gallstones.  Once this pancreatitis had begun to resolve and he was relatively asymptomatic, he was brought to the operating room.  TECHNIQUE:  He was placed supine on the operating table and general anesthetic was administered.  His abdomen was sterilely prepped and draped.  Marcaine 0.25% plain was infiltrated in the subumbilical region and a small incision was made in the subumbilical region and carried down through the subcutaneous tissue until the fascia was identified.  A one cm incision was made in the fascia and the peritoneal cavity was entered bluntly and under direct vision. A purse-string suture of 0 Vicryl was placed around the fascial edges.  Next, an Hasson trocar was introduced into the peritoneal cavity and pneumoperitoneum created by insufflation of CO2 gas.  The laparoscope was then introduced and the gallbladder was noted to be slightly thickened and edematous.  Next, an 11 mm trocar was placed through an epigastric incision and two 5 mm trocars were placed through two right abdominal 5 mm incisions.  The fundus of the gallbladder was grasped and was fairly edematous.  Using blunt dissection, omental and duodenal adhesions were  swept away from the body and infundibulum.  The infundibulum was identified and grasped and retracted laterally.  Using careful blunt dissection, the cystic duct was identified at its junction with the gallbladder and a clip applied here.  Partial incision into the duct was performed and the cholangiocatheter was passed into the duct.  I was unable to obtain a cholangiogram, because upon irrigating the cholangiocath, it appeared that I had immediate resistance at the cystic duct/common bile duct junction and continued to have backflow of saline despite trying to occlude that system.  I subsequently aborted the cholangiogram, clipped the cystic duct 3 x proximally and divided it.  The cystic artery was identified, clipped and divided.  Next, the gallbladder was dissected free from the liver bed and was edematous.  After I did notice there appeared to be a sizable accessory duct coming straight from the liver bed to the gallbladder and this was clipped.  After the gallbladder was removed, it was placed in an Endopouch bag.  I did place a drain into the gallbladder fossa when hemostasis was adequate and exited it out through one of the trocar sites in the right lateral abdomen.  I subsequently removed the gallbladder from the abdominal cavity through a subumbilical incision and then closed the fascial defect by tying down the purse-string suture.   The gallbladder fossa was once again inspected and there was no obvious bile leak or bleeding.  The drain was then sutured to the skin  with a 3-0 nylon suture.  The pneumoperitoneum was released and the trocars were removed.  The skin incisions at the remaining trocar sites were closed with 4-0 Monocryl subcuticular stitches, followed by Steri-Strips and sterile dressings.  Patient tolerated the procedure well without any apparent complications and was taken to the recovery room in satisfactory condition. DD:  02/15/00 TD:  02/17/00 Job:  [id number]

## 2011-05-27 LAB — COMPREHENSIVE METABOLIC PANEL
ALT: 31
Albumin: 3.9
Alkaline Phosphatase: 64
Potassium: 4
Sodium: 134 — ABNORMAL LOW
Total Protein: 7.4

## 2011-05-27 LAB — CBC
Hemoglobin: 14.7
Platelets: 256
RDW: 13.4

## 2014-02-25 ENCOUNTER — Encounter (HOSPITAL_COMMUNITY): Payer: Self-pay | Admitting: Emergency Medicine

## 2014-02-25 ENCOUNTER — Emergency Department (INDEPENDENT_AMBULATORY_CARE_PROVIDER_SITE_OTHER)
Admission: EM | Admit: 2014-02-25 | Discharge: 2014-02-25 | Disposition: A | Discharge status: Home / Self Care | Payer: BC Managed Care – PPO | Source: Home / Self Care | Attending: Family Medicine | Admitting: Family Medicine

## 2014-02-25 DIAGNOSIS — L089 Local infection of the skin and subcutaneous tissue, unspecified: Secondary | ICD-10-CM

## 2014-02-25 DIAGNOSIS — L723 Sebaceous cyst: Secondary | ICD-10-CM

## 2014-02-25 NOTE — ED Notes (Signed)
Reports abscess on left side of neck x 2 wks.   Area is red, swollen, and tender to touch.

## 2014-02-25 NOTE — ED Provider Notes (Signed)
Michael Khan is a 58 y.o. male who presents to Urgent Care today for left neck abscess. Patient notes 2 weeks of a left neck mass that has become painful over the last day or 2. This is recurrence of a similar cyst in the area drained in the past. He denies any fevers or chills nausea vomiting or diarrhea.   History reviewed. No pertinent past medical history. History  Substance Use Topics  . Smoking status: Never Smoker   . Smokeless tobacco: Not on file  . Alcohol Use: No   ROS as above Medications: No current facility-administered medications for this encounter.   No current outpatient prescriptions on file.    Exam:  BP 150/94  Pulse 89  Temp(Src) 98.8 F (37.1 C) (Oral)  Resp 14  SpO2 98% Gen: Well NAD Skin: 1 cm fluctuant mass left neck. Surrounding erythema and tenderness. No induration.  Abscess incision and drainage:  Consent obtained and timeout performed. Skin cleansed with alcohol and 1 mL of lidocaine with epinephrine was used to achieve anesthesia. Incision was made and widened. Sebaceous material was removed. Blunt dissection was used to break up loculations. The entire cyst wall structure was not able to be removed Patient tolerated procedure well .  No results found for this or any previous visit (from the past 24 hour(s)). No results found.  Assessment and Plan: 58 y.o. male with infected sebaceous cyst of the left neck. Incised and drained today. Referral to general surgery for definitive management we no longer infected.  Discussed warning signs or symptoms. Please see discharge instructions. Patient expresses understanding.    Gregor Hams, MD 02/25/14 [phone]

## 2014-02-25 NOTE — Discharge Instructions (Signed)
Thank you for coming in today. Followup with general surgery in a month or 2   Epidermal Cyst An epidermal cyst is sometimes called a sebaceous cyst, epidermal inclusion cyst, or infundibular cyst. These cysts usually contain a substance that looks "pasty" or "cheesy" and may have a bad smell. This substance is a protein called keratin. Epidermal cysts are usually found on the face, neck, or trunk. They may also occur in the vaginal area or other parts of the genitalia of both men and women. Epidermal cysts are usually small, painless, slow-growing bumps or lumps that move freely under the skin. It is important not to try to pop them. This may cause an infection and lead to tenderness and swelling. CAUSES  Epidermal cysts may be caused by a deep penetrating injury to the skin or a plugged hair follicle, often associated with acne. SYMPTOMS  Epidermal cysts can become inflamed and cause:  Redness.  Tenderness.  Increased temperature of the skin over the bumps or lumps.  Grayish-white, bad smelling material that drains from the bump or lump. DIAGNOSIS  Epidermal cysts are easily diagnosed by your caregiver during an exam. Rarely, a tissue sample (biopsy) may be taken to rule out other conditions that may resemble epidermal cysts. TREATMENT   Epidermal cysts often get better and disappear on their own. They are rarely ever cancerous.  If a cyst becomes infected, it may become inflamed and tender. This may require opening and draining the cyst. Treatment with antibiotics may be necessary. When the infection is gone, the cyst may be removed with minor surgery.  Small, inflamed cysts can often be treated with antibiotics or by injecting steroid medicines.  Sometimes, epidermal cysts become large and bothersome. If this happens, surgical removal in your caregiver's office may be necessary. HOME CARE INSTRUCTIONS  Only take over-the-counter or prescription medicines as directed by your  caregiver.  Take your antibiotics as directed. Finish them even if you start to feel better. SEEK MEDICAL CARE IF:   Your cyst becomes tender, red, or swollen.  Your condition is not improving or is getting worse.  You have any other questions or concerns. MAKE SURE YOU:  Understand these instructions.  Will watch your condition.  Will get help right away if you are not doing well or get worse. Document Released: 07/16/2004 Document Revised: 11/07/2011 Document Reviewed: 02/21/2011 [facility] Patient Information 2015 [place], [place]. This information is not intended to replace advice given to you by your health care provider. Make sure you discuss any questions you have with your health care provider.   Abscess Care After An abscess (also called a boil or furuncle) is an infected area that contains a collection of pus. Signs and symptoms of an abscess include pain, tenderness, redness, or hardness, or you may feel a moveable soft area under your skin. An abscess can occur anywhere in the body. The infection may spread to surrounding tissues causing cellulitis. A cut (incision) by the surgeon was made over your abscess and the pus was drained out. Gauze may have been packed into the space to provide a drain that will allow the cavity to heal from the inside outwards. The boil may be painful for 5 to 7 days. Most people with a boil do not have high fevers. Your abscess, if seen early, may not have localized, and may not have been lanced. If not, another appointment may be required for this if it does not get better on its own or with medications. HOME  CARE INSTRUCTIONS   Only take over-the-counter or prescription medicines for pain, discomfort, or fever as directed by your caregiver.  When you bathe, soak and then remove gauze or iodoform packs at least daily or as directed by your caregiver. You may then wash the wound gently with mild soapy water. Repack with gauze or do as your caregiver  directs. SEEK IMMEDIATE MEDICAL CARE IF:   You develop increased pain, swelling, redness, drainage, or bleeding in the wound site.  You develop signs of generalized infection including muscle aches, chills, fever, or a general ill feeling.  An oral temperature above 102 F (38.9 C) develops, not controlled by medication. See your caregiver for a recheck if you develop any of the symptoms described above. If medications (antibiotics) were prescribed, take them as directed. Document Released: 03/03/2005 Document Revised: 11/07/2011 Document Reviewed: 10/29/2007 [facility] Patient Information 2015 [place], [place]. This information is not intended to replace advice given to you by your health care provider. Make sure you discuss any questions you have with your health care provider.

## 2014-03-13 ENCOUNTER — Ambulatory Visit (INDEPENDENT_AMBULATORY_CARE_PROVIDER_SITE_OTHER): Payer: BC Managed Care – PPO | Admitting: General Surgery

## 2015-03-15 ENCOUNTER — Emergency Department (HOSPITAL_COMMUNITY)
Admission: EM | Admit: 2015-03-15 | Discharge: 2015-03-15 | Disposition: A | Discharge status: Home / Self Care | Payer: BC Managed Care – PPO | Attending: Emergency Medicine | Admitting: Emergency Medicine

## 2015-03-15 ENCOUNTER — Encounter (HOSPITAL_COMMUNITY): Payer: Self-pay | Admitting: Emergency Medicine

## 2015-03-15 DIAGNOSIS — J321 Chronic frontal sinusitis: Secondary | ICD-10-CM | POA: Diagnosis not present

## 2015-03-15 DIAGNOSIS — R0981 Nasal congestion: Secondary | ICD-10-CM | POA: Diagnosis present

## 2015-03-15 DIAGNOSIS — Z8669 Personal history of other diseases of the nervous system and sense organs: Secondary | ICD-10-CM | POA: Insufficient documentation

## 2015-03-15 HISTORY — DX: Guillain-Barre syndrome: G61.0

## 2015-03-15 MED ORDER — OXYMETAZOLINE HCL 0.05 % NA SOLN: 1.0000 | Freq: Two times a day (BID) | NASAL | Status: DC

## 2015-03-15 MED ORDER — AMOXICILLIN-POT CLAVULANATE 400-57 MG/5ML PO SUSR: 400.0000 mg | Freq: Three times a day (TID) | ORAL | Status: AC

## 2015-03-15 NOTE — ED Provider Notes (Signed)
CSN: [id number]     Arrival date & time 03/15/15  0815 History   None    Chief Complaint  Patient presents with  . Nasal Congestion    The patient says he has been sick off and on for aboutn three months.  He has taken muxinex and it works but then it comes back.       (Consider location/radiation/quality/duration/timing/severity/associated sxs/prior Treatment) HPI   PCP: No primary care provider on file. Blood pressure 163/90, pulse 68, temperature 97.3 F (36.3 C), temperature source Oral, resp. rate 16, SpO2 100 %.  Michael Khan is a 59 y.o.male without any significant PMH presents to the ER with complaints of nasal congestion. He reports for the past 3 months he has been experiencing nasal congestion and sinus pressure. These symptoms improve but then they return. He is having difficulty getting restful sleep due to the congestion and pressure.  He hs had some mild post nasal drip but denies having coughing,  headaches, diaphoresis, fever, headache, weakness (general or focal), confusion, change of vision,  neck pain, dysphagia, aphagia, chest pain, shortness of breath,  back pain, abdominal pains, nausea, vomiting, diarrhea, lower extremity swelling, rash. Is blood pressure is elevated in the ED, he says he typically checks it at [facility] and it has never been this high for him. No PCP, requests referral.    Past Medical History  Diagnosis Date  . Guillain Barr syndrome     When he was 42, he received a flu shot caused him to develop Guillain Barre Syndrome   No past surgical history on file. No family history on file. History  Substance Use Topics  . Smoking status: Never Smoker   . Smokeless tobacco: Not on file  . Alcohol Use: No    Review of Systems  10 Systems reviewed and are negative for acute change except as noted in the HPI.     Allergies  Review of patient's allergies indicates no known allergies.  Home Medications   Prior to Admission medications    Medication Sig Start Date End Date Taking? Authorizing Provider  amoxicillin-clavulanate (AUGMENTIN) 400-57 MG/5ML suspension Take 5 mLs (400 mg total) by mouth 3 (three) times daily. 03/15/15 03/22/15  Delos Haring, PA-C  oxymetazoline (AFRIN NASAL SPRAY) 0.05 % nasal spray Place 1 spray into both nostrils 2 (two) times daily. 03/15/15   Michael Luck Carlota Raspberry, PA-C   BP 163/90 mmHg  Pulse 68  Temp(Src) 97.3 F (36.3 C) (Oral)  Resp 16  SpO2 100% Physical Exam  Constitutional: He appears well-developed and well-nourished. No distress.  HENT:  Head: Normocephalic and atraumatic. Head is without right periorbital erythema and without left periorbital erythema.  Right Ear: Tympanic membrane and ear canal normal.  Left Ear: Tympanic membrane and ear canal normal.  Nose: Rhinorrhea present. Right sinus exhibits maxillary sinus tenderness and frontal sinus tenderness. Left sinus exhibits maxillary sinus tenderness and frontal sinus tenderness.  +nasal congestion  Eyes: Pupils are equal, round, and reactive to light.  Neck: Normal range of motion. Neck supple.  Cardiovascular: Normal rate and regular rhythm.   Pulmonary/Chest: Effort normal and breath sounds normal. He has no wheezes. He has no rhonchi.  Abdominal: Soft.  Musculoskeletal:  No lower extremity swelling  Neurological: He is alert.  Skin: Skin is warm and dry.  No rash  Nursing note and vitals reviewed.   ED Course  Procedures (including critical care time) Labs Review Labs Reviewed - No data to display  Imaging Review  No results found.   EKG Interpretation None      MDM   Final diagnoses:  Chronic frontal sinusitis    Sinusitis for 3 months, the patient has not had any abx and does not have a PCP Referral for PCP given, advised to closely watch BP and given return to ED precautions. Started on Augmentin, Afrin (3 days).  No CP, SOB, LE swelling, weakness.  Medications - No data to display  59 y.o.Michael Khan  Michael Khan's evaluation in the Emergency Department is complete. It has been determined that no acute conditions requiring further emergency intervention are present at this time. The patient/guardian have been advised of the diagnosis and plan. We have discussed signs and symptoms that warrant return to the ED, such as changes or worsening in symptoms.  Vital signs are stable at discharge. Filed Vitals:   03/15/15 0827  BP: 163/90  Pulse: 68  Temp: 97.3 F (36.3 C)  Resp: 16    Patient/guardian has voiced understanding and agreed to follow-up with the PCP or specialist.     Delos Haring, PA-C 03/15/15 [id number]  Leonard Schwartz, MD 03/16/15 [phone]

## 2015-03-15 NOTE — ED Notes (Signed)
Patient is alert and orientedx4.  Patient was explained discharge instructions and they understood them with no questions.   

## 2015-03-15 NOTE — ED Notes (Addendum)
The patient says he has been sick off and on for aboutn three months.  He has taken muxinex and it works but then it comes back.  He cannot sleep due to the congestion.  He says the only pain he has is from the pressure.  He did say that he had Guillain Barre syndrome from the flu shot.

## 2015-03-15 NOTE — Discharge Instructions (Signed)

## 2015-03-29 ENCOUNTER — Emergency Department (INDEPENDENT_AMBULATORY_CARE_PROVIDER_SITE_OTHER)
Admission: EM | Admit: 2015-03-29 | Discharge: 2015-03-29 | Disposition: A | Discharge status: Home / Self Care | Payer: BC Managed Care – PPO | Source: Home / Self Care | Attending: Family Medicine | Admitting: Family Medicine

## 2015-03-29 ENCOUNTER — Encounter (HOSPITAL_COMMUNITY): Payer: Self-pay | Admitting: Emergency Medicine

## 2015-03-29 DIAGNOSIS — L259 Unspecified contact dermatitis, unspecified cause: Secondary | ICD-10-CM

## 2015-03-29 MED ORDER — PREDNISONE 5 MG PO TABS: ORAL_TABLET | ORAL | Status: DC

## 2015-03-29 NOTE — Discharge Instructions (Signed)

## 2015-03-29 NOTE — ED Notes (Signed)
Pt states that he started having a rash a week after he completed his antibiotics he is unknown if the rash is from the antibiotics or something else

## 2015-03-29 NOTE — ED Provider Notes (Signed)
CSN: [id number]     Arrival date & time 03/29/15  1607 History   First MD Initiated Contact with Patient 03/29/15 1740     Chief Complaint  Patient presents with  . Rash   (Consider location/radiation/quality/duration/timing/severity/associated sxs/prior Treatment) HPI Comments: Patient presents with a pruritic rash x 4 days. Worsening and spreading. No systemic fevers, chills, weight loss or pain. Has been working in his garden. Had been off abx for 1 week prior to rash.   Patient is a 59 y.o. male presenting with rash. The history is provided by the patient.  Rash   Past Medical History  Diagnosis Date  . Guillain Barr syndrome     When he was 70, he received a flu shot caused him to develop Guillain Barre Syndrome   History reviewed. No pertinent past surgical history. History reviewed. No pertinent family history. History  Substance Use Topics  . Smoking status: Never Smoker   . Smokeless tobacco: Not on file  . Alcohol Use: No    Review of Systems  Skin: Positive for rash.  All other systems reviewed and are negative.   Allergies  Review of patient's allergies indicates no known allergies.  Home Medications   Prior to Admission medications   Medication Sig Start Date End Date Taking? Authorizing Provider  oxymetazoline (AFRIN NASAL SPRAY) 0.05 % nasal spray Place 1 spray into both nostrils 2 (two) times daily. 03/15/15   Tiffany Carlota Raspberry, PA-C  predniSONE (DELTASONE) 5 MG tablet 5mg  12 day prednisone pack take daily as directed 03/29/15   Bjorn Pippin, PA-C   BP 145/81 mmHg  Pulse 74  Temp(Src) 97.8 F (36.6 C) (Oral)  Resp 20  SpO2 97% Physical Exam  Constitutional: He is oriented to person, place, and time. He appears well-developed and well-nourished. No distress.  HENT:  Head: Normocephalic and atraumatic.  Neurological: He is alert and oriented to person, place, and time.  Skin: Skin is warm and dry. Rash noted. He is not diaphoretic.  Macular papular  rash on the trunk and extremities with mild excoriations.   Psychiatric: His behavior is normal.  Nursing note and vitals reviewed.   ED Course  Procedures (including critical care time) Labs Review Labs Reviewed - No data to display  Imaging Review No results found.   MDM   1. Contact dermatitis    Treat with pred pack and supportive care. F/U if worsens.     Bjorn Pippin, PA-C 03/29/15 [phone]

## 2015-04-10 ENCOUNTER — Encounter: Payer: Self-pay | Admitting: Family Medicine

## 2015-04-10 ENCOUNTER — Ambulatory Visit (INDEPENDENT_AMBULATORY_CARE_PROVIDER_SITE_OTHER): Payer: BC Managed Care – PPO | Admitting: Family Medicine

## 2015-04-10 VITALS — BP 169/81 | HR 72 | Temp 97.7°F | Resp 18 | Ht 71.0 in | Wt 276.0 lb

## 2015-04-10 DIAGNOSIS — Z0000 Encounter for general adult medical examination without abnormal findings: Secondary | ICD-10-CM | POA: Diagnosis not present

## 2015-04-10 DIAGNOSIS — J309 Allergic rhinitis, unspecified: Secondary | ICD-10-CM | POA: Insufficient documentation

## 2015-04-10 LAB — CBC WITH DIFFERENTIAL/PLATELET
Basophils Absolute: 0 10*3/uL (ref 0.0–0.1)
Basophils Relative: 0 % (ref 0–1)
Eosinophils Absolute: 0.3 10*3/uL (ref 0.0–0.7)
Eosinophils Relative: 3 % (ref 0–5)
HEMATOCRIT: 43.4 % (ref 39.0–52.0)
Hemoglobin: 14.2 g/dL (ref 13.0–17.0)
Lymphocytes Relative: 36 % (ref 12–46)
Lymphs Abs: 3.5 10*3/uL (ref 0.7–4.0)
MCH: 30.3 pg (ref 26.0–34.0)
MCHC: 32.7 g/dL (ref 30.0–36.0)
MCV: 92.7 fL (ref 78.0–100.0)
MONO ABS: 0.8 10*3/uL (ref 0.1–1.0)
MPV: 10.4 fL (ref 8.6–12.4)
Monocytes Relative: 8 % (ref 3–12)
NEUTROS ABS: 5.1 10*3/uL (ref 1.7–7.7)
Neutrophils Relative %: 53 % (ref 43–77)
PLATELETS: 294 10*3/uL (ref 150–400)
RBC: 4.68 MIL/uL (ref 4.22–5.81)
RDW: 14.2 % (ref 11.5–15.5)
WBC: 9.6 10*3/uL (ref 4.0–10.5)

## 2015-04-10 NOTE — Progress Notes (Signed)
Patient ID: Michael Khan, male   DOB: September 29, 1955, 59 y.o.   MRN: [id number]   Koty Anctil, is a 59 y.o. male  PHX:[id number]  AXK:[id number]  DOB - [birth year]  CC:  Chief Complaint  Patient presents with  . Establish Care    sinus problems/drainage. Rash on skin        HPI: Michael Khan is a 59 y.o. male here to establish care. He has not had a primary care provider in quite some time. His medical history is positive for nasal allergies and Guillain Barre syndrome in 1999. He recently had contact dermatitis and was treated with prednisone. Otherwise his medical history is unremarkable except for allergic rhinitis.He has metal plates and screws in his left leg and wrist.  No Known Allergies Past Medical History  Diagnosis Date  . Guillain Barr syndrome     When he was 2, he received a flu shot caused him to develop Guillain Barre Syndrome  . Guillain Barr syndrome    Current Outpatient Prescriptions on File Prior to Visit  Medication Sig Dispense Refill  . oxymetazoline (AFRIN NASAL SPRAY) 0.05 % nasal spray Place 1 spray into both nostrils 2 (two) times daily. (Patient not taking: Reported on 04/10/2015) 30 mL 0  . predniSONE (DELTASONE) 5 MG tablet 5mg  12 day prednisone pack take daily as directed (Patient not taking: Reported on 04/10/2015) 1 tablet 0   No current facility-administered medications on file prior to visit.   Family History  Problem Relation Age of Onset  . Cancer Mother    Social History   Social History  . Marital Status: Married    Spouse Name: N/A  . Number of Children: N/A  . Years of Education: N/A   Occupational History  . Not on file.   Social History Main Topics  . Smoking status: Never Smoker   . Smokeless tobacco: Not on file  . Alcohol Use: Yes     Comment: occ 1-2 a year   . Drug Use: No  . Sexual Activity: Yes   Other Topics Concern  . Not on file   Social History Narrative    Review of Systems: Constitutional: Negative for  fever, chills, appetite change, weight loss. Positive for some fatigue and some cold sweats HENT: Negative for ear pain, ear discharge.nose bleeds. Positive for nasal congestion from allergic rhinitis. Eyes: Negative for pain, discharge, redness, itching and visual disturbance. Neck: Negative for pain, stiffness Respiratory: Negative for cough, shortness of breath,   Cardiovascular: Negative for chest pain, palpitations and leg swelling. Gastrointestinal: Negative for abdominal distention, abdominal pain, nausea, vomiting, diarrhe. Experiences some constipations and occassional heartburn. Genitourinary: Negative for dysuria, urgency hematuria, flank pain. Positive for frequency Musculoskeletal: Negative for back pain, joint pain, joint  swelling, arthralgia and gait problem.Negative for weakness. Neurological: Negative for dizziness, tremors, seizures, syncope,   light-headedness. We has some numbness and tingling of his right foot due to a previous injury a number of years ago. Has occassional headaches. Hematological: Negative for easy bruising or bleeding Psychiatric/Behavioral: Negative for depression, anxiety, decreased concentration, confusion He has dislexia.    Objective:   Filed Vitals:   04/10/15 0822  BP: 169/81  Pulse: 72  Temp: 97.7 F (36.5 C)  Resp: 18    Physical Exam: Constitutional: Patient appears well-developed and well-nourished. No distress. HENT: Normocephalic, atraumatic, External right and left ear normal. Oropharynx is clear and moist.  Eyes: Conjunctivae and EOM are normal. PERRLA, no scleral icterus. Neck: Normal ROM.  Neck supple. No lymphadenopathy, No thyromegaly. CVS: RRR, S1/S2 +, no murmurs, no gallops, no rubs Pulmonary: Effort and breath sounds normal, no stridor, rhonchi, wheezes, rales.  Abdominal: Soft. Normoactive BS,, no distension, tenderness, rebound or guarding.  Musculoskeletal: Normal range of motion. No edema and no tenderness.  Neuro:  Alert.Normal muscle tone coordination. Non-focal Skin: Skin is warm and dry. No rash noted. Not diaphoretic. No erythema. No pallor. Psychiatric: Normal mood and affect. Behavior, judgment, thought content normal.  Lab Results  Component Value Date   WBC 11.6* 10/03/2008   HGB 15.3 10/03/2008   HCT 43.8 10/03/2008   MCV 91.0 10/03/2008   PLT 276 10/03/2008   Lab Results  Component Value Date   CREATININE 1.03 10/03/2008   BUN 19 10/03/2008   NA 138 10/03/2008   K 4.7 10/03/2008   CL 103 10/03/2008   CO2 24 10/03/2008    No results found for: HGBA1C Lipid Panel  No results found for: CHOL, TRIG, HDL, CHOLHDL, VLDL, LDLCALC     Assessment and plan:   Visit to establish care -review of information provided by patient -baseline labs ordered   Cmet with GFR  CBC   Lipid panel   psa   HIV  Elevated blood pressure -return in one month for a recheck of bp with nurse.  Health maintenance review -he reports being up to date on his colonoscopy and tetanus.  Follow-up one month for BP check.  The patient was given clear instructions to go to ER or return to medical center if symptoms don't improve, worsen or new problems develop. The patient verbalized understanding. The patient was told to call to get lab results if they haven't heard anything in the next week.       Micheline Chapman, MSN, FNP-BC   04/10/2015, 8:52 AM

## 2015-04-10 NOTE — Patient Instructions (Signed)
We will let you know if anything in the bloodwork we draw today needs attention. Try to eat a healthy diet, high in fresh fruits and vegetables, low in fats and cholesterol and sweets. Attempt to lose weight by reducing calories Exercise regularly Use safety measures such as seat belts, making sure your fire extinguishers and smoke alarms are in good condition. Need to see you back in one year unless I let you know otherwise based on blood work. Come back for BP check with nurse in one month.

## 2015-04-11 LAB — COMPLETE METABOLIC PANEL WITH GFR
ALBUMIN: 3.7 g/dL (ref 3.6–5.1)
ALT: 34 U/L (ref 9–46)
AST: 19 U/L (ref 10–35)
Alkaline Phosphatase: 56 U/L (ref 40–115)
BUN: 23 mg/dL (ref 7–25)
CHLORIDE: 103 mmol/L (ref 98–110)
CO2: 24 mmol/L (ref 20–31)
Calcium: 9.1 mg/dL (ref 8.6–10.3)
Creat: 1.18 mg/dL (ref 0.70–1.33)
GFR, EST NON AFRICAN AMERICAN: 67 mL/min (ref >=60)
GFR, Est African American: 78 mL/min (ref >=60)
GLUCOSE: 96 mg/dL (ref 65–99)
Potassium: 4.7 mmol/L (ref 3.5–5.3)
SODIUM: 140 mmol/L (ref 135–146)
TOTAL PROTEIN: 6.5 g/dL (ref 6.1–8.1)
Total Bilirubin: 0.7 mg/dL (ref 0.2–1.2)

## 2015-04-11 LAB — LIPID PANEL
CHOL/HDL RATIO: 4 ratio (ref <=5.0)
Cholesterol: 171 mg/dL (ref 125–200)
HDL: 43 mg/dL (ref >=40)
LDL Cholesterol: 96 mg/dL (ref <130)
TRIGLYCERIDES: 161 mg/dL — AB (ref <150)
VLDL: 32 mg/dL — AB (ref <30)

## 2015-04-11 LAB — PSA: PSA: 0.38 ng/mL (ref <=4.00)

## 2015-04-11 LAB — HIV ANTIBODY (ROUTINE TESTING W REFLEX): HIV 1&2 Ab, 4th Generation: NONREACTIVE

## 2015-05-11 ENCOUNTER — Ambulatory Visit: Payer: BC Managed Care – PPO

## 2015-09-02 ENCOUNTER — Emergency Department (HOSPITAL_COMMUNITY)
Admission: EM | Admit: 2015-09-02 | Discharge: 2015-09-02 | Disposition: A | Discharge status: Home / Self Care | Payer: BC Managed Care – PPO | Attending: Emergency Medicine | Admitting: Emergency Medicine

## 2015-09-02 ENCOUNTER — Encounter (HOSPITAL_COMMUNITY): Payer: Self-pay | Admitting: Emergency Medicine

## 2015-09-02 DIAGNOSIS — L299 Pruritus, unspecified: Secondary | ICD-10-CM | POA: Diagnosis not present

## 2015-09-02 DIAGNOSIS — J069 Acute upper respiratory infection, unspecified: Secondary | ICD-10-CM

## 2015-09-02 DIAGNOSIS — R0981 Nasal congestion: Secondary | ICD-10-CM | POA: Diagnosis present

## 2015-09-02 DIAGNOSIS — H9203 Otalgia, bilateral: Secondary | ICD-10-CM | POA: Insufficient documentation

## 2015-09-02 DIAGNOSIS — Z8669 Personal history of other diseases of the nervous system and sense organs: Secondary | ICD-10-CM | POA: Insufficient documentation

## 2015-09-02 DIAGNOSIS — J309 Allergic rhinitis, unspecified: Secondary | ICD-10-CM

## 2015-09-02 MED ORDER — FLUTICASONE PROPIONATE 50 MCG/ACT NA SUSP: 2.0000 | Freq: Every day | NASAL | Status: DC

## 2015-09-02 MED ORDER — CETIRIZINE HCL 10 MG PO TABS: 10.0000 mg | ORAL_TABLET | Freq: Every day | ORAL | Status: DC

## 2015-09-02 NOTE — ED Notes (Signed)
Patient states nasal congestion x 3 weeks.   Denies sore throat.   Patient states some ear pressure.   Patient denies chest congestion and cough occasionally.

## 2015-09-02 NOTE — ED Notes (Signed)
See PA assessment 

## 2015-09-02 NOTE — ED Provider Notes (Signed)
CSN: [id number]     Arrival date & time 09/02/15  1031 History  By signing my name below, I, Michael Khan, attest that this documentation has been prepared under the direction and in the presence of Michael Pean, PA-C. Electronically Signed: Eustaquio Khan, ED Scribe. 09/02/2015. 11:24 AM.   Chief Complaint  Patient presents with  . Nasal Congestion  . Otalgia   The history is provided by the patient. No language interpreter was used.     HPI Comments: Michael Khan is a 60 y.o. male who presents to the Emergency Department complaining of gradual onset, constant, nasal congestion, rhinorrhea, post nasal drip, bilateral ear pressure, itchy watery eyes, sneezing, and mild cough x 3 weeks, worsening 1 week ago. Pt has been taking Mucinex this week without relief. He did not take anything this morning for his symptoms. Denies sore throat, fever, shortness of breath, chest pain, abdominal pain, nausea, vomiting, diarrhea, syncopal episodes, lightheadedness, dizziness, rash, myalgias, or any other associated symptoms.   Past Medical History  Diagnosis Date  . Guillain Barr syndrome ([place])     When he was 64, he received a flu shot caused him to develop Guillain Barre Syndrome  . Guillain Barr syndrome [facility])    Past Surgical History  Procedure Laterality Date  . Wrist reconstruction    . Ankle reconstruction    . Cholecystectomy    . Nose surgery     Family History  Problem Relation Age of Onset  . Cancer Mother    Social History  Substance Use Topics  . Smoking status: Never Smoker   . Smokeless tobacco: None  . Alcohol Use: Yes     Comment: occ 1-2 a year     Review of Systems  Constitutional: Negative for fever and appetite change.  HENT: Positive for congestion, ear pain, postnasal drip, rhinorrhea and sneezing. Negative for ear discharge, mouth sores, nosebleeds, sinus pressure, sore throat and trouble swallowing.   Eyes: Positive for discharge and itching. Negative for  pain, redness and visual disturbance.  Respiratory: Positive for cough. Negative for shortness of breath and wheezing.   Cardiovascular: Negative for chest pain.  Gastrointestinal: Negative for nausea, vomiting, abdominal pain and diarrhea.  Musculoskeletal: Negative for myalgias.  Skin: Negative for rash.  Neurological: Negative for dizziness, syncope and light-headedness.   Allergies  Review of patient's allergies indicates no known allergies.  Home Medications   Prior to Admission medications   Medication Sig Start Date End Date Taking? Authorizing Provider  cetirizine (ZYRTEC ALLERGY) 10 MG tablet Take 1 tablet (10 mg total) by mouth daily. 09/02/15   Michael Pean, PA-C  fluticasone (FLONASE) 50 MCG/ACT nasal spray Place 2 sprays into both nostrils daily. 09/02/15   Michael Pean, PA-C  oxymetazoline (AFRIN NASAL SPRAY) 0.05 % nasal spray Place 1 spray into both nostrils 2 (two) times daily. Patient not taking: Reported on 04/10/2015 03/15/15   Michael Haring, PA-C  predniSONE (DELTASONE) 5 MG tablet 5mg  12 day prednisone pack take daily as directed Patient not taking: Reported on 04/10/2015 03/29/15   Michael Pippin, PA-C   Triage Vitals:  BP 135/92 mmHg  Pulse 85  Temp(Src) 97.8 F (36.6 C) (Oral)  Resp 16  Ht 5\' 11"  (1.803 m)  Wt 245 lb (111.131 kg)  BMI 34.19 kg/m2  SpO2 100%   Physical Exam  Constitutional: He appears well-developed and well-nourished. No distress.  Nontoxic appearing.  HENT:  Head: Normocephalic and atraumatic.  Right Ear: External ear normal.  Left Ear: External ear normal.  Mouth/Throat: Oropharynx is clear and moist. No oropharyngeal exudate.  Mild middle ear effusion bilaterally; Otherwise TMs normal No erythema or loss of landmarks. Boggy nasal turbinates bilaterally. No tonsillar hypertrophy or exudates Post nasal drip.  No sinus tenderness.  Eyes: Conjunctivae are normal. Pupils are equal, round, and reactive to light. Right eye exhibits  no discharge. Left eye exhibits no discharge.  Neck: Normal range of motion. Neck supple. No JVD present. No tracheal deviation present.  Cardiovascular: Normal rate, regular rhythm, normal heart sounds and intact distal pulses.   Pulmonary/Chest: Effort normal and breath sounds normal. No respiratory distress. He has no wheezes. He has no rhonchi. He has no rales.  Lungs are clear to auscultation bilaterally.  Abdominal: Soft. There is no tenderness.  Lymphadenopathy:    He has no cervical adenopathy.  Neurological: He is alert. Coordination normal.  Skin: Skin is warm and dry. No rash noted. He is not diaphoretic. No erythema. No pallor.  Psychiatric: He has a normal mood and affect. His behavior is normal.  Nursing note and vitals reviewed.   ED Course  Procedures (including critical care time)  DIAGNOSTIC STUDIES: Oxygen Saturation is 100% on RA, normal by my interpretation.    COORDINATION OF CARE: 11:22 AM-Discussed treatment plan which includes nasal steroid with pt at bedside and pt agreed to plan.   Labs Review Labs Reviewed - No data to display  Imaging Review No results found.   EKG Interpretation None      Filed Vitals:   09/02/15 1042  BP: 135/92  Pulse: 85  Temp: 97.8 F (36.6 C)  TempSrc: Oral  Resp: 16  Height: 5\' 11"  (1.803 m)  Weight: 111.131 kg  SpO2: 100%     MDM   Meds given in ED:  Medications - No data to display  New Prescriptions   CETIRIZINE (ZYRTEC ALLERGY) 10 MG TABLET    Take 1 tablet (10 mg total) by mouth daily.   FLUTICASONE (FLONASE) 50 MCG/ACT NASAL SPRAY    Place 2 sprays into both nostrils daily.    Final diagnoses:  URI (upper respiratory infection)  Allergic rhinitis, unspecified allergic rhinitis type   This  is a 60 y.o. male who presents to the Emergency Department complaining of gradual onset, constant, nasal congestion, rhinorrhea, post nasal drip, bilateral ear pressure, itchy watery eyes, sneezing, and mild cough  x 3 weeks, worsening 1 week ago. Pt has been taking Mucinex this week without relief. He did not take anything this morning for his symptoms. Denies sore throat, fever, shortness of breath, chest pain, abdominal pain.  On exam the patient is afebrile nontoxic appearing. His lungs are clear auscultation bilaterally. He has boggy nasal turbinates bilaterally. Throat is clear. No peritonsillar abscess. He denies sore throat. No evidence of ear infection. He started with upper respiratory infection and allergic rhinitis. We'll discharge with prescriptions for Zyrtec and fluticasone nasal steroid. Encouraged close follow-up by his primary care doctor. I advised the patient to follow-up with their primary care provider this week. I advised the patient to return to the emergency department with new or worsening symptoms or new concerns. The patient verbalized understanding and agreement with plan.    I personally performed the services described in this documentation, which was scribed in my presence. The recorded information has been reviewed and is accurate.         Michael Pean, PA-C 09/02/15 1132  Harvel Quale, MD 09/05/15 1001

## 2015-09-02 NOTE — Discharge Instructions (Signed)
Allergic Rhinitis Allergic rhinitis is when the mucous membranes in the nose respond to allergens. Allergens are particles in the air that cause your body to have an allergic reaction. This causes you to release allergic antibodies. Through a chain of events, these eventually cause you to release histamine into the blood stream. Although meant to protect the body, it is this release of histamine that causes your discomfort, such as frequent sneezing, congestion, and an itchy, runny nose.  CAUSES Seasonal allergic rhinitis (hay fever) is caused by pollen allergens that may come from grasses, trees, and weeds. Year-round allergic rhinitis (perennial allergic rhinitis) is caused by allergens such as house dust mites, pet dander, and mold spores. SYMPTOMS  Nasal stuffiness (congestion).  Itchy, runny nose with sneezing and tearing of the eyes. DIAGNOSIS Your health care provider can help you determine the allergen or allergens that trigger your symptoms. If you and your health care provider are unable to determine the allergen, skin or blood testing may be used. Your health care provider will diagnose your condition after taking your health history and performing a physical exam. Your health care provider may assess you for other related conditions, such as asthma, pink eye, or an ear infection. TREATMENT Allergic rhinitis does not have a cure, but it can be controlled by:  Medicines that block allergy symptoms. These may include allergy shots, nasal sprays, and oral antihistamines.  Avoiding the allergen. Hay fever may often be treated with antihistamines in pill or nasal spray forms. Antihistamines block the effects of histamine. There are over-the-counter medicines that may help with nasal congestion and swelling around the eyes. Check with your health care provider before taking or giving this medicine. If avoiding the allergen or the medicine prescribed do not work, there are many new medicines  your health care provider can prescribe. Stronger medicine may be used if initial measures are ineffective. Desensitizing injections can be used if medicine and avoidance does not work. Desensitization is when a patient is given ongoing shots until the body becomes less sensitive to the allergen. Make sure you follow up with your health care provider if problems continue. HOME CARE INSTRUCTIONS It is not possible to completely avoid allergens, but you can reduce your symptoms by taking steps to limit your exposure to them. It helps to know exactly what you are allergic to so that you can avoid your specific triggers. SEEK MEDICAL CARE IF:  You have a fever.  You develop a cough that does not stop easily (persistent).  You have shortness of breath.  You start wheezing.  Symptoms interfere with normal daily activities.   This information is not intended to replace advice given to you by your health care provider. Make sure you discuss any questions you have with your health care provider.   Document Released: 05/10/2001 Document Revised: 09/05/2014 Document Reviewed: 04/22/2013 Elsevier Interactive Patient Education 2016 [employer] Upper Respiratory Infection, Adult Most upper respiratory infections (URIs) are a viral infection of the air passages leading to the lungs. A URI affects the nose, throat, and upper air passages. The most common type of URI is nasopharyngitis and is typically referred to as "the common cold." URIs run their course and usually go away on their own. Most of the time, a URI does not require medical attention, but sometimes a bacterial infection in the upper airways can follow a viral infection. This is called a secondary infection. Sinus and middle ear infections are common types of secondary upper respiratory infections.  Bacterial pneumonia can also complicate a URI. A URI can worsen asthma and chronic obstructive pulmonary disease (COPD). Sometimes, these  complications can require emergency medical care and may be life threatening.  CAUSES Almost all URIs are caused by viruses. A virus is a type of germ and can spread from one person to another.  RISKS FACTORS You may be at risk for a URI if:   You smoke.   You have chronic heart or lung disease.  You have a weakened defense (immune) system.   You are very young or very old.   You have nasal allergies or asthma.  You work in crowded or poorly ventilated areas.  You work in health care facilities or schools. SIGNS AND SYMPTOMS  Symptoms typically develop 2-3 days after you come in contact with a cold virus. Most viral URIs last 7-10 days. However, viral URIs from the influenza virus (flu virus) can last 14-18 days and are typically more severe. Symptoms may include:   Runny or stuffy (congested) nose.   Sneezing.   Cough.   Sore throat.   Headache.   Fatigue.   Fever.   Loss of appetite.   Pain in your forehead, behind your eyes, and over your cheekbones (sinus pain).  Muscle aches.  DIAGNOSIS  Your health care provider may diagnose a URI by:  Physical exam.  Tests to check that your symptoms are not due to another condition such as:  Strep throat.  Sinusitis.  Pneumonia.  Asthma. TREATMENT  A URI goes away on its own with time. It cannot be cured with medicines, but medicines may be prescribed or recommended to relieve symptoms. Medicines may help:  Reduce your fever.  Reduce your cough.  Relieve nasal congestion. HOME CARE INSTRUCTIONS   Take medicines only as directed by your health care provider.   Gargle warm saltwater or take cough drops to comfort your throat as directed by your health care provider.  Use a warm mist humidifier or inhale steam from a shower to increase air moisture. This may make it easier to breathe.  Drink enough fluid to keep your urine clear or pale yellow.   Eat soups and other clear broths and maintain  good nutrition.   Rest as needed.   Return to work when your temperature has returned to normal or as your health care provider advises. You may need to stay home longer to avoid infecting others. You can also use a face mask and careful hand washing to prevent spread of the virus.  Increase the usage of your inhaler if you have asthma.   Do not use any tobacco products, including cigarettes, chewing tobacco, or electronic cigarettes. If you need help quitting, ask your health care provider. PREVENTION  The best way to protect yourself from getting a cold is to practice good hygiene.   Avoid oral or hand contact with people with cold symptoms.   Wash your hands often if contact occurs.  There is no clear evidence that vitamin C, vitamin E, echinacea, or exercise reduces the chance of developing a cold. However, it is always recommended to get plenty of rest, exercise, and practice good nutrition.  SEEK MEDICAL CARE IF:   You are getting worse rather than better.   Your symptoms are not controlled by medicine.   You have chills.  You have worsening shortness of breath.  You have brown or red mucus.  You have yellow or brown nasal discharge.  You have pain in your  face, especially when you bend forward.  You have a fever.  You have swollen neck glands.  You have pain while swallowing.  You have white areas in the back of your throat. SEEK IMMEDIATE MEDICAL CARE IF:   You have severe or persistent:  Headache.  Ear pain.  Sinus pain.  Chest pain.  You have chronic lung disease and any of the following:  Wheezing.  Prolonged cough.  Coughing up blood.  A change in your usual mucus.  You have a stiff neck.  You have changes in your:  Vision.  Hearing.  Thinking.  Mood. MAKE SURE YOU:   Understand these instructions.  Will watch your condition.  Will get help right away if you are not doing well or get worse.   This information is not  intended to replace advice given to you by your health care provider. Make sure you discuss any questions you have with your health care provider.   Document Released: 02/08/2001 Document Revised: 12/30/2014 Document Reviewed: 11/20/2013 Elsevier Interactive Patient Education [employer].

## 2016-02-17 ENCOUNTER — Encounter: Payer: Self-pay | Admitting: Family

## 2016-02-17 ENCOUNTER — Ambulatory Visit (INDEPENDENT_AMBULATORY_CARE_PROVIDER_SITE_OTHER): Payer: BC Managed Care – PPO | Admitting: Family

## 2016-02-17 VITALS — BP 136/90 | HR 78 | Temp 97.5°F | Resp 16 | Ht 71.0 in | Wt 285.0 lb

## 2016-02-17 DIAGNOSIS — Z0001 Encounter for general adult medical examination with abnormal findings: Secondary | ICD-10-CM

## 2016-02-17 DIAGNOSIS — R6889 Other general symptoms and signs: Secondary | ICD-10-CM

## 2016-02-17 DIAGNOSIS — Z1211 Encounter for screening for malignant neoplasm of colon: Secondary | ICD-10-CM | POA: Diagnosis not present

## 2016-02-17 DIAGNOSIS — R21 Rash and other nonspecific skin eruption: Secondary | ICD-10-CM | POA: Diagnosis not present

## 2016-02-17 MED ORDER — TRIAMCINOLONE ACETONIDE 0.1 % EX OINT: 1.0000 "application " | TOPICAL_OINTMENT | Freq: Two times a day (BID) | CUTANEOUS | Status: DC

## 2016-02-17 NOTE — Patient Instructions (Addendum)
Thank you for choosing [facility].  Summary/Instructions:  They will call to schedule your colonoscopy and dermatologist appointment.  Apply the Triamcinalone 2x per day to the affected areas. Also use Eucerin, Aveeno, or Dove moisturizing products.   Your prescription(s) have been submitted to your pharmacy or been printed and provided for you. Please take as directed and contact our office if you believe you are having problem(s) with the medication(s) or have any questions.  Please stop by the lab on the lower level of the building for your blood work when fasting. Your results will be released to [place] (or called to you) after review, usually within 72 hours after test completion. If any changes need to be made, you will be notified at that same time.  If your symptoms worsen or fail to improve, please contact our office for further instruction, or in case of emergency go directly to the emergency room at the closest medical facility.   Health Maintenance, Male A healthy lifestyle and preventative care can promote health and wellness.  Maintain regular health, dental, and eye exams.  Eat a healthy diet. Foods like vegetables, fruits, whole grains, low-fat dairy products, and lean protein foods contain the nutrients you need and are low in calories. Decrease your intake of foods high in solid fats, added sugars, and salt. Get information about a proper diet from your health care provider, if necessary.  Regular physical exercise is one of the most important things you can do for your health. Most adults should get at least 150 minutes of moderate-intensity exercise (any activity that increases your heart rate and causes you to sweat) each week. In addition, most adults need muscle-strengthening exercises on 2 or more days a week.   Maintain a healthy weight. The body mass index (BMI) is a screening tool to identify possible weight problems. It provides an estimate of body fat  based on height and weight. Your health care provider can find your BMI and can help you achieve or maintain a healthy weight. For males 20 years and older:  A BMI below 18.5 is considered underweight.  A BMI of 18.5 to 24.9 is normal.  A BMI of 25 to 29.9 is considered overweight.  A BMI of 30 and above is considered obese.  Maintain normal blood lipids and cholesterol by exercising and minimizing your intake of saturated fat. Eat a balanced diet with plenty of fruits and vegetables. Blood tests for lipids and cholesterol should begin at age 60 and be repeated every 5 years. If your lipid or cholesterol levels are high, you are over age 38, or you are at high risk for heart disease, you may need your cholesterol levels checked more frequently.Ongoing high lipid and cholesterol levels should be treated with medicines if diet and exercise are not working.  If you smoke, find out from your health care provider how to quit. If you do not use tobacco, do not start.  Lung cancer screening is recommended for adults aged 27-80 years who are at high risk for developing lung cancer because of a history of smoking. A yearly low-dose CT scan of the lungs is recommended for people who have at least a 30-pack-year history of smoking and are current smokers or have quit within the past 15 years. A pack year of smoking is smoking an average of 1 pack of cigarettes a day for 1 year (for example, a 30-pack-year history of smoking could mean smoking 1 pack a day for 30 years  or 2 packs a day for 15 years). Yearly screening should continue until the smoker has stopped smoking for at least 15 years. Yearly screening should be stopped for people who develop a health problem that would prevent them from having lung cancer treatment.  If you choose to drink alcohol, do not have more than 2 drinks per day. One drink is considered to be 12 oz (360 mL) of beer, 5 oz (150 mL) of wine, or 1.5 oz (45 mL) of liquor.  Avoid the  use of street drugs. Do not share needles with anyone. Ask for help if you need support or instructions about stopping the use of drugs.  High blood pressure causes heart disease and increases the risk of stroke. High blood pressure is more likely to develop in:  People who have blood pressure in the end of the normal range (100-139/85-89 mm Hg).  People who are overweight or obese.  People who are African American.  If you are 83-50 years of age, have your blood pressure checked every 3-5 years. If you are 54 years of age or older, have your blood pressure checked every year. You should have your blood pressure measured twice--once when you are at a hospital or clinic, and once when you are not at a hospital or clinic. Record the average of the two measurements. To check your blood pressure when you are not at a hospital or clinic, you can use:  An automated blood pressure machine at a pharmacy.  A home blood pressure monitor.  If you are 57-29 years old, ask your health care provider if you should take aspirin to prevent heart disease.  Diabetes screening involves taking a blood sample to check your fasting blood sugar level. This should be done once every 3 years after age 61 if you are at a normal weight and without risk factors for diabetes. Testing should be considered at a younger age or be carried out more frequently if you are overweight and have at least 1 risk factor for diabetes.  Colorectal cancer can be detected and often prevented. Most routine colorectal cancer screening begins at the age of 2 and continues through age 24. However, your health care provider may recommend screening at an earlier age if you have risk factors for colon cancer. On a yearly basis, your health care provider may provide home test kits to check for hidden blood in the stool. A small camera at the end of a tube may be used to directly examine the colon (sigmoidoscopy or colonoscopy) to detect the earliest  forms of colorectal cancer. Talk to your health care provider about this at age 54 when routine screening begins. A direct exam of the colon should be repeated every 5-10 years through age 32, unless early forms of precancerous polyps or small growths are found.  People who are at an increased risk for hepatitis B should be screened for this virus. You are considered at high risk for hepatitis B if:  You were born in a country where hepatitis B occurs often. Talk with your health care provider about which countries are considered high risk.  Your parents were born in a high-risk country and you have not received a shot to protect against hepatitis B (hepatitis B vaccine).  You have HIV or AIDS.  You use needles to inject street drugs.  You live with, or have sex with, someone who has hepatitis B.  You are a man who has sex with other men (  MSM).  You get hemodialysis treatment.  You take certain medicines for conditions like cancer, organ transplantation, and autoimmune conditions.  Hepatitis C blood testing is recommended for all people born from [birth year] through 1966 and any individual with known risk factors for hepatitis C.  Healthy men should no longer receive prostate-specific antigen (PSA) blood tests as part of routine cancer screening. Talk to your health care provider about prostate cancer screening.  Testicular cancer screening is not recommended for adolescents or adult males who have no symptoms. Screening includes self-exam, a health care provider exam, and other screening tests. Consult with your health care provider about any symptoms you have or any concerns you have about testicular cancer.  Practice safe sex. Use condoms and avoid high-risk sexual practices to reduce the spread of sexually transmitted infections (STIs).  You should be screened for STIs, including gonorrhea and chlamydia if:  You are sexually active and are younger than 24 years.  You are older than 24  years, and your health care provider tells you that you are at risk for this type of infection.  Your sexual activity has changed since you were last screened, and you are at an increased risk for chlamydia or gonorrhea. Ask your health care provider if you are at risk.  If you are at risk of being infected with HIV, it is recommended that you take a prescription medicine daily to prevent HIV infection. This is called pre-exposure prophylaxis (PrEP). You are considered at risk if:  You are a man who has sex with other men (MSM).  You are a heterosexual man who is sexually active with multiple partners.  You take drugs by injection.  You are sexually active with a partner who has HIV.  Talk with your health care provider about whether you are at high risk of being infected with HIV. If you choose to begin PrEP, you should first be tested for HIV. You should then be tested every 3 months for as long as you are taking PrEP.  Use sunscreen. Apply sunscreen liberally and repeatedly throughout the day. You should seek shade when your shadow is shorter than you. Protect yourself by wearing long sleeves, pants, a wide-brimmed hat, and sunglasses year round whenever you are outdoors.  Tell your health care provider of new moles or changes in moles, especially if there is a change in shape or color. Also, tell your health care provider if a mole is larger than the size of a pencil eraser.  A one-time screening for abdominal aortic aneurysm (AAA) and surgical repair of large AAAs by ultrasound is recommended for men aged 46-75 years who are current or former smokers.  Stay current with your vaccines (immunizations).   This information is not intended to replace advice given to you by your health care provider. Make sure you discuss any questions you have with your health care provider.   Document Released: 02/11/2008 Document Revised: 09/05/2014 Document Reviewed: 01/10/2011 Elsevier Interactive  Patient Education [employer].

## 2016-02-17 NOTE — Assessment & Plan Note (Signed)
1) Anticipatory Guidance: Discussed importance of wearing a seatbelt while driving and not texting while driving; changing batteries in smoke detector at least once annually; wearing suntan lotion when outside; eating a balanced and moderate diet; getting physical activity at least 30 minutes per day.  2) Immunizations / Screenings / Labs:  Declines Zostavax. All other immunizations are up to date per recommendations. Due for colon cancer screening with referral to Gastroenterology placed. Due for a dental and vision screens encouraged to be completed independently. Obtain A1c for diabetes screenings. Obtain PSA for prostate cancer screening. Obtain hepatitis C antibody for Hepatitis C screening. All other screenings are up to date per recommendations. Obtain CBC, CMET, and Lipid profile.  Overall well exam with risk factors for cardiovascular disease including obesity and elevated blood pressure. Recommend weight loss of 5-10% of current body weight through nutrition and physical activity changes. Recommend increasing physical activity to 30 minutes of moderate level activity daily. Encourage nutritional intake that focuses on nutrient dense foods and is moderate, varied, and balanced and is low in saturated fats and processed/sugary foods. Recommend decreasing sodium in diet. Continue to monitor blood pressure at home. Continue other healthy lifestyle behaviors and choices. Follow-up prevention exam in 1 year. Follow-up office visit pending blood work.

## 2016-02-17 NOTE — Progress Notes (Addendum)
Subjective:    Patient ID: Michael Khan, male    DOB: 03/14/1956, 60 y.o.   MRN: [id number]  Chief Complaint  Patient presents with  . Establish Care    CPE not fasting    HPI:  Michael Khan is a 60 y.o. male who presents today for an annual wellness visit.   1) Health Maintenance -   Diet - Averages about 2-3 meals per day consisting of chicken, beef, fruits, vegetables and fish occasionally; 1-2 cups of caffeine daily  Exercise - walking daily; playing with grandchildren   2) Preventative Exams / Immunizations:  Dental -- Due for exam  Vision -- Due for exam   Health Maintenance  Topic Date Due  . Hepatitis C Screening  02-18-1956  . COLONOSCOPY  05/20/2005  . ZOSTAVAX  05/21/2015  . INFLUENZA VACCINE  03/29/2016  . TETANUS/TDAP  01/10/2025  . HIV Screening  Completed     There is no immunization history on file for this patient.    3.) Rash - Associated symptom of a rash located on his bilateral arms and legs that has been going on for about 1 month. Described as red and itchy.  Modifying factors include topical antibiotics and hydrocortisone have helped a little with no resolution. Indicates it continues to remain about the same.   No Known Allergies   Outpatient Prescriptions Prior to Visit  Medication Sig Dispense Refill  . cetirizine (ZYRTEC ALLERGY) 10 MG tablet Take 1 tablet (10 mg total) by mouth daily. 30 tablet 1  . fluticasone (FLONASE) 50 MCG/ACT nasal spray Place 2 sprays into both nostrils daily. 16 g 0  . oxymetazoline (AFRIN NASAL SPRAY) 0.05 % nasal spray Place 1 spray into both nostrils 2 (two) times daily. (Patient not taking: Reported on 04/10/2015) 30 mL 0  . predniSONE (DELTASONE) 5 MG tablet 5mg  12 day prednisone pack take daily as directed (Patient not taking: Reported on 04/10/2015) 1 tablet 0   No facility-administered medications prior to visit.     Past Medical History  Diagnosis Date  . Guillain Barr syndrome ([place])    When he was 74, he received a flu shot caused him to develop Guillain Barre Syndrome  . Guillain Barr syndrome ([place])   . Chicken pox   . Measles      Past Surgical History  Procedure Laterality Date  . Wrist reconstruction    . Ankle reconstruction    . Cholecystectomy    . Nose surgery       Family History  Problem Relation Age of Onset  . Liver cancer Mother   . Healthy Father      Social History   Social History  . Marital Status: Married    Spouse Name: N/A  . Number of Children: 2  . Years of Education: 12   Occupational History  . Security    Social History Main Topics  . Smoking status: Never Smoker   . Smokeless tobacco: Never Used  . Alcohol Use: No  . Drug Use: No  . Sexual Activity: Yes   Other Topics Concern  . Not on file   Social History Narrative   Fun: Make wine, work in the yard, play basketball, cycling, play with his grandson     Review of Systems  Constitutional: Denies fever, chills, fatigue, or significant weight gain/loss. HENT: Head: Denies headache or neck pain Ears: Denies changes in hearing, ringing in ears, earache, drainage Nose: Denies discharge, stuffiness, itching, nosebleed, sinus  pain Throat: Denies sore throat, hoarseness, dry mouth, sores, thrush Eyes: Denies loss/changes in vision, pain, redness, blurry/double vision, flashing lights Cardiovascular: Denies chest pain/discomfort, tightness, palpitations, shortness of breath with activity, difficulty lying down, swelling, sudden awakening with shortness of breath Respiratory: Denies shortness of breath, cough, sputum production, wheezing Gastrointestinal: Denies dysphasia, heartburn, change in appetite, nausea, change in bowel habits, rectal bleeding, constipation, diarrhea, yellow skin or eyes Genitourinary: Denies frequency, urgency, burning/pain, blood in urine, incontinence, change in urinary strength. Positive for urinary frequency and nocturia  Musculoskeletal:  Denies muscle/joint pain, stiffness, back pain, redness or swelling of joints, trauma Skin: Denies rashes, lumps, itching, dryness, color changes, or hair/nail changes Positive for rash Neurological: Denies dizziness, fainting, seizures, weakness, numbness, tingling, tremor Psychiatric - Denies nervousness, stress, depression or memory loss Endocrine: Denies heat or cold intolerance, sweating, frequent urination, excessive thirst, changes in appetite Hematologic: Denies ease of bruising or bleeding     Objective:     BP 136/90 mmHg  Pulse 78  Temp(Src) 97.5 F (36.4 C) (Oral)  Resp 16  Ht 5\' 11"  (1.803 m)  Wt 285 lb (129.275 kg)  BMI 39.77 kg/m2  SpO2 98% Nursing note and vital signs reviewed.  Physical Exam  Constitutional: He is oriented to person, place, and time. He appears well-developed and well-nourished.  HENT:  Head: Normocephalic.  Right Ear: Hearing, tympanic membrane, external ear and ear canal normal.  Left Ear: Hearing, tympanic membrane, external ear and ear canal normal.  Nose: Nose normal.  Mouth/Throat: Uvula is midline, oropharynx is clear and moist and mucous membranes are normal.  Eyes: Conjunctivae and EOM are normal. Pupils are equal, round, and reactive to light.  Neck: Neck supple. No JVD present. No tracheal deviation present. No thyromegaly present.  Cardiovascular: Normal rate, regular rhythm, normal heart sounds and intact distal pulses.   Pulmonary/Chest: Effort normal and breath sounds normal.  Abdominal: Soft. Bowel sounds are normal. He exhibits no distension and no mass. There is no tenderness. There is no rebound and no guarding.  Musculoskeletal: Normal range of motion. He exhibits no edema or tenderness.  Lymphadenopathy:    He has no cervical adenopathy.  Neurological: He is alert and oriented to person, place, and time. He has normal reflexes. No cranial nerve deficit. He exhibits normal muscle tone. Coordination normal.  Skin: Skin is  warm and dry.  Psychiatric: He has a normal mood and affect. His behavior is normal. Judgment and thought content normal.       Assessment & Plan:   Problem List Items Addressed This Visit      Musculoskeletal and Integument   Rash and nonspecific skin eruption    Rash appears consistent with eczema/skin dryness. Start triamcinolone ointment. Recommend use of Aveeno, Dove, or Eucerin moisturizers. Referral to dermatology place for skin exam and follow-up if symptoms worsen or do not improve.      Relevant Medications   triamcinolone ointment (KENALOG) 0.1 %   Other Relevant Orders   Ambulatory referral to Dermatology     Other   Encounter for routine adult medical exam with abnormal findings - Primary    1) Anticipatory Guidance: Discussed importance of wearing a seatbelt while driving and not texting while driving; changing batteries in smoke detector at least once annually; wearing suntan lotion when outside; eating a balanced and moderate diet; getting physical activity at least 30 minutes per day.  2) Immunizations / Screenings / Labs:  Declines Zostavax. All other immunizations are up to  date per recommendations. Due for colon cancer screening with referral to Gastroenterology placed. Due for a dental and vision screens encouraged to be completed independently. Obtain A1c for diabetes screenings. Obtain PSA for prostate cancer screening. Obtain hepatitis C antibody for Hepatitis C screening. All other screenings are up to date per recommendations. Obtain CBC, CMET, and Lipid profile.  Overall well exam with risk factors for cardiovascular disease including obesity and elevated blood pressure. Recommend weight loss of 5-10% of current body weight through nutrition and physical activity changes. Recommend increasing physical activity to 30 minutes of moderate level activity daily. Encourage nutritional intake that focuses on nutrient dense foods and is moderate, varied, and balanced and  is low in saturated fats and processed/sugary foods. Recommend decreasing sodium in diet. Continue to monitor blood pressure at home. Continue other healthy lifestyle behaviors and choices. Follow-up prevention exam in 1 year. Follow-up office visit pending blood work.       Relevant Orders   PSA   Lipid panel   Hemoglobin A1c   Comprehensive metabolic panel   CBC   Hepatitis C antibody    Other Visit Diagnoses    Colon cancer screening        Relevant Orders    Ambulatory referral to Gastroenterology        I have discontinued Mr. Bligh oxymetazoline, predniSONE, fluticasone, and cetirizine. I am also having him start on triamcinolone ointment.   Meds ordered this encounter  Medications  . triamcinolone ointment (KENALOG) 0.1 %    Sig: Apply 1 application topically 2 (two) times daily.    Dispense:  30 g    Refill:  1    Order Specific Question:  Supervising Provider    Answer:  Pricilla Holm A [id number]     Follow-up: Return in about 3 months (around 05/19/2016), or if symptoms worsen or fail to improve.   Mauricio Po, FNP

## 2016-02-17 NOTE — Assessment & Plan Note (Signed)
Rash appears consistent with eczema/skin dryness. Start triamcinolone ointment. Recommend use of Aveeno, Dove, or Eucerin moisturizers. Referral to dermatology place for skin exam and follow-up if symptoms worsen or do not improve.

## 2016-02-18 ENCOUNTER — Other Ambulatory Visit (INDEPENDENT_AMBULATORY_CARE_PROVIDER_SITE_OTHER): Payer: BC Managed Care – PPO

## 2016-02-18 DIAGNOSIS — R6889 Other general symptoms and signs: Secondary | ICD-10-CM

## 2016-02-18 DIAGNOSIS — Z0001 Encounter for general adult medical examination with abnormal findings: Secondary | ICD-10-CM

## 2016-02-18 LAB — LIPID PANEL
CHOL/HDL RATIO: 5
Cholesterol: 186 mg/dL (ref 0–200)
HDL: 38.8 mg/dL — ABNORMAL LOW (ref >39.00)
LDL CALC: 121 mg/dL — AB (ref 0–99)
NONHDL: 146.87
TRIGLYCERIDES: 130 mg/dL (ref 0.0–149.0)
VLDL: 26 mg/dL (ref 0.0–40.0)

## 2016-02-18 LAB — COMPREHENSIVE METABOLIC PANEL
ALBUMIN: 3.9 g/dL (ref 3.5–5.2)
ALT: 22 U/L (ref 0–53)
AST: 19 U/L (ref 0–37)
Alkaline Phosphatase: 58 U/L (ref 39–117)
BUN: 20 mg/dL (ref 6–23)
CHLORIDE: 105 meq/L (ref 96–112)
CO2: 28 meq/L (ref 19–32)
CREATININE: 1.03 mg/dL (ref 0.40–1.50)
Calcium: 9.6 mg/dL (ref 8.4–10.5)
GFR: 94.5 mL/min (ref >60.00)
GLUCOSE: 106 mg/dL — AB (ref 70–99)
POTASSIUM: 4.2 meq/L (ref 3.5–5.1)
SODIUM: 135 meq/L (ref 135–145)
Total Bilirubin: 1 mg/dL (ref 0.2–1.2)
Total Protein: 6.8 g/dL (ref 6.0–8.3)

## 2016-02-18 LAB — CBC
HEMATOCRIT: 41.5 % (ref 39.0–52.0)
HEMOGLOBIN: 13.8 g/dL (ref 13.0–17.0)
MCHC: 33.1 g/dL (ref 30.0–36.0)
MCV: 90.2 fl (ref 78.0–100.0)
Platelets: 243 10*3/uL (ref 150.0–400.0)
RBC: 4.61 Mil/uL (ref 4.22–5.81)
RDW: 14 % (ref 11.5–15.5)
WBC: 9.7 10*3/uL (ref 4.0–10.5)

## 2016-02-18 LAB — HEMOGLOBIN A1C: HEMOGLOBIN A1C: 5.9 % (ref 4.6–6.5)

## 2016-02-18 LAB — PSA: PSA: 0.32 ng/mL (ref 0.10–4.00)

## 2016-02-19 LAB — HEPATITIS C ANTIBODY: HCV AB: NEGATIVE

## 2016-02-22 ENCOUNTER — Telehealth: Payer: Self-pay | Admitting: Family

## 2016-02-22 NOTE — Telephone Encounter (Signed)
Please inform patient that his blood work shows that his hepatitis C is negative and his hemoglobin A1c is 5.9 indicating prediabetes. No medication is required at this time, although changes to nutritional intake and physical activity may help lower this number. His kidney function, liver function, electrolytes, prostate function, and white/red blood cells are all within the normal limits. Lastly, his cholesterol is slightly elevated with an LDL or bad cholesterol 121 and his HDL or good cholesterol low at 38. One factored in with his age and other risk factors this places him in approximately 14% risk for a coronary artery disease event next 10 years. Given this increased risk, it is recommended anywhere above 7.5% to start cholesterol medication. Therefore my recommendation will be the same as to start a cholesterol medication to help reduce his risk factors. If he is willing we'll send in a medication either atorvastatin or rosuvastatin for him to start.

## 2016-02-24 ENCOUNTER — Encounter: Payer: Self-pay | Admitting: Family

## 2016-02-24 MED ORDER — PREDNISONE 5 MG (21) PO TBPK: ORAL_TABLET | ORAL | Status: DC

## 2016-02-24 MED ORDER — SULFAMETHOXAZOLE-TRIMETHOPRIM 800-160 MG PO TABS: 1.0000 | ORAL_TABLET | Freq: Two times a day (BID) | ORAL | Status: DC

## 2016-02-24 MED ORDER — ROSUVASTATIN CALCIUM 10 MG PO TABS: 10.0000 mg | ORAL_TABLET | Freq: Every day | ORAL | Status: DC

## 2016-02-24 NOTE — Telephone Encounter (Signed)
No pharmacy on file. Prescriptions have been printed. I have also sent an antibiotic and prednisone dose pack for his rash. The letter is available to print.

## 2016-02-24 NOTE — Telephone Encounter (Signed)
Pt aware of results. Pt wants

## 2016-02-24 NOTE — Telephone Encounter (Signed)
Patient is calling to follow up on lab results. No notes that he has been called at this point. He also states that the medication given to him for the rash is not helping him and that we may need to look at another treatment option. Please give the patient a call/

## 2016-02-24 NOTE — Telephone Encounter (Signed)
Pt wants to know if you could write him a note for yesterday bc he did not go to work due to the bad rash and swelling in ankles. Also he wants to know if you could send him in an oral pill for the rash. He states its spreading to him legs now and the bumps will ooze and wherever it oozes to a bump pops up in that area. Also pt is ok with cholesterol medication being sent in.

## 2016-02-25 MED ORDER — ROSUVASTATIN CALCIUM 10 MG PO TABS: 10.0000 mg | ORAL_TABLET | Freq: Every day | ORAL | Status: DC

## 2016-02-25 MED ORDER — PREDNISONE 5 MG (21) PO TBPK: ORAL_TABLET | ORAL | Status: DC

## 2016-02-25 MED ORDER — SULFAMETHOXAZOLE-TRIMETHOPRIM 800-160 MG PO TABS
1.0000 | ORAL_TABLET | Freq: Two times a day (BID) | ORAL | Status: DC
Start: 2016-02-25 — End: 2016-04-29

## 2016-02-25 NOTE — Telephone Encounter (Signed)
Medications have been sent to pharmacy that pt requested. Pt is aware.

## 2016-02-29 ENCOUNTER — Encounter: Payer: Self-pay | Admitting: Family

## 2016-03-03 ENCOUNTER — Encounter: Payer: Self-pay | Admitting: Gastroenterology

## 2016-03-23 ENCOUNTER — Telehealth: Payer: Self-pay | Admitting: Emergency Medicine

## 2016-03-23 DIAGNOSIS — R21 Rash and other nonspecific skin eruption: Secondary | ICD-10-CM

## 2016-03-23 MED ORDER — TRIAMCINOLONE ACETONIDE 0.1 % EX OINT: 1.0000 "application " | TOPICAL_OINTMENT | Freq: Two times a day (BID) | CUTANEOUS | 1 refills | Status: DC

## 2016-03-23 NOTE — Telephone Encounter (Signed)
Medication sent.

## 2016-03-23 NOTE — Telephone Encounter (Signed)
LVM letting pt know.  

## 2016-03-23 NOTE — Telephone Encounter (Signed)
Pt needs cream to stop the itching he has. He stated Terri Piedra was aware of the situation. Pharmacy is [facility] on [address]. Please follow up thanks.

## 2016-03-23 NOTE — Telephone Encounter (Signed)
Please advise 

## 2016-04-29 ENCOUNTER — Encounter: Payer: Self-pay | Admitting: Gastroenterology

## 2016-04-29 ENCOUNTER — Ambulatory Visit (AMBULATORY_SURGERY_CENTER): Payer: Self-pay

## 2016-04-29 VITALS — Ht 71.0 in | Wt 284.0 lb

## 2016-04-29 DIAGNOSIS — Z1211 Encounter for screening for malignant neoplasm of colon: Secondary | ICD-10-CM

## 2016-04-29 MED ORDER — SUPREP BOWEL PREP KIT 17.5-3.13-1.6 GM/177ML PO SOLN: 1.0000 | Freq: Once | ORAL | 0 refills | Status: AC

## 2016-04-29 NOTE — Progress Notes (Signed)
No allergies to eggs or soy No past problems with anesthesia No diet meds No home oxygen  Has email and internet; declined emmi 

## 2016-05-11 ENCOUNTER — Encounter: Payer: Self-pay | Admitting: Gastroenterology

## 2016-05-11 ENCOUNTER — Ambulatory Visit (AMBULATORY_SURGERY_CENTER): Payer: BC Managed Care – PPO | Admitting: Gastroenterology

## 2016-05-11 VITALS — BP 125/66 | HR 71 | Temp 98.2°F | Resp 15 | Ht 71.0 in | Wt 284.0 lb

## 2016-05-11 DIAGNOSIS — Z1211 Encounter for screening for malignant neoplasm of colon: Secondary | ICD-10-CM

## 2016-05-11 DIAGNOSIS — D124 Benign neoplasm of descending colon: Secondary | ICD-10-CM

## 2016-05-11 MED ORDER — SODIUM CHLORIDE 0.9 % IV SOLN: 500.0000 mL | INTRAVENOUS | Status: DC

## 2016-05-11 NOTE — Patient Instructions (Signed)
YOU HAD AN ENDOSCOPIC PROCEDURE TODAY AT [facility]:   Refer to the procedure report that was given to you for any specific questions about what was found during the examination.  If the procedure report does not answer your questions, please call your gastroenterologist to clarify.  If you requested that your care partner not be given the details of your procedure findings, then the procedure report has been included in a sealed envelope for you to review at your convenience later.  YOU SHOULD EXPECT: Some feelings of bloating in the abdomen. Passage of more gas than usual.  Walking can help get rid of the air that was put into your GI tract during the procedure and reduce the bloating. If you had a lower endoscopy (such as a colonoscopy or flexible sigmoidoscopy) you may notice spotting of blood in your stool or on the toilet paper. If you underwent a bowel prep for your procedure, you may not have a normal bowel movement for a few days.  Please Note:  You might notice some irritation and congestion in your nose or some drainage.  This is from the oxygen used during your procedure.  There is no need for concern and it should clear up in a day or so.  SYMPTOMS TO REPORT IMMEDIATELY:   Following lower endoscopy (colonoscopy or flexible sigmoidoscopy):  Excessive amounts of blood in the stool  Significant tenderness or worsening of abdominal pains  Swelling of the abdomen that is new, acute  Fever of 100F or higher   For urgent or emergent issues, a gastroenterologist can be reached at any hour by calling [phone].   DIET:  We do recommend a small meal at first, but then you may proceed to your regular diet.  Drink plenty of fluids but you should avoid alcoholic beverages for 24 hours. Try to increase the fiber in your diet, and drink plenty of water.  ACTIVITY:  You should plan to take it easy for the rest of today and you should NOT DRIVE or use heavy machinery until  tomorrow (because of the sedation medicines used during the test).    FOLLOW UP: Our staff will call the number listed on your records the next business day following your procedure to check on you and address any questions or concerns that you may have regarding the information given to you following your procedure. If we do not reach you, we will leave a message.  However, if you are feeling well and you are not experiencing any problems, there is no need to return our call.  We will assume that you have returned to your regular daily activities without incident.  If any biopsies were taken you will be contacted by phone or by letter within the next 1-3 weeks.  Please call us at [phone] if you have not heard about the biopsies in 3 weeks.    SIGNATURES/CONFIDENTIALITY: You and/or your care partner have signed paperwork which will be entered into your electronic medical record.  These signatures attest to the fact that that the information above on your After Visit Summary has been reviewed and is understood.  Full responsibility of the confidentiality of this discharge information lies with you and/or your care-partner.  Colonoscopy re-scheduled due to poor prep.   Will reschedule for this coming Monday, and your pre-visit is Thursday (tomorrow) at 1:30pm.  Read all of the handouts given to you by your recovery room nurse.   Please, follow ALL of  the directions. Thank-you.

## 2016-05-11 NOTE — Progress Notes (Signed)
To recovery, report to Hodges, RN, VSS 

## 2016-05-11 NOTE — Progress Notes (Signed)
Called to room to assist during endoscopic procedure.  Patient ID and intended procedure confirmed with present staff. Received instructions for my participation in the procedure from the performing physician.  

## 2016-05-11 NOTE — Op Note (Signed)
[place] Patient Name: Michael Khan Procedure Date: 05/11/2016 11:12 AM MRN: [id number] Endoscopist: Mauri Pole , MD Age: 60 Referring MD:  Date of Birth: May 03, 1956 Gender: Male Account #: [id number] Procedure:                Colonoscopy Indications:              Screening for colorectal malignant neoplasm, This                            is the patient's first colonoscopy for colorectal                            cancer screening Medicines:                Monitored Anesthesia Care Procedure:                Pre-Anesthesia Assessment:                           - Prior to the procedure, a History and Physical                            was performed, and patient medications and                            allergies were reviewed. The patient's tolerance of                            previous anesthesia was also reviewed. The risks                            and benefits of the procedure and the sedation                            options and risks were discussed with the patient.                            All questions were answered, and informed consent                            was obtained. Prior Anticoagulants: The patient has                            taken no previous anticoagulant or antiplatelet                            agents. ASA Grade Assessment: II - A patient with                            mild systemic disease. After reviewing the risks                            and benefits, the patient was deemed in  satisfactory condition to undergo the procedure.                           After obtaining informed consent, the colonoscope                            was passed under direct vision. Throughout the                            procedure, the patient's blood pressure, pulse, and                            oxygen saturations were monitored continuously. The                            Model CF-HQ190L [id number]) scope was  introduced                            through the anus and advanced to the the cecum,                            identified by appendiceal orifice and ileocecal                            valve. The colonoscopy was somewhat difficult due                            to poor bowel prep with stool present. The patient                            tolerated the procedure well. The quality of the                            bowel preparation was poor. The ileocecal valve,                            appendiceal orifice, and rectum were photographed. Scope In: [id number] AM Scope Out: 11:25:48 AM Scope Withdrawal Time: 0 hours 4 minutes 2 seconds  Total Procedure Duration: 0 hours 9 minutes 41 seconds  Findings:                 The perianal and digital rectal examinations were                            normal.                           Two sessile polyps were found in the descending                            colon. The polyps were 6 to 9 mm in size. These                            polyps were removed with a cold snare.  Resection                            and retrieval were complete.                           Extensive amounts of semi-liquid semi-solid stool                            was found in the entire colon, precluding                            visualization.                           Scattered small and large-mouthed diverticula were                            found in the sigmoid colon and descending colon. Complications:            No immediate complications. Estimated Blood Loss:     Estimated blood loss: none. Impression:               - Preparation of the colon was poor.                           - Two 6 to 9 mm polyps in the descending colon,                            removed with a cold snare. Resected and retrieved.                           - Stool in the entire examined colon.                           - Diverticulosis in the sigmoid colon and in the                             descending colon. Recommendation:           - Patient has a contact number available for                            emergencies. The signs and symptoms of potential                            delayed complications were discussed with the                            patient. Return to normal activities tomorrow.                            Written discharge instructions were provided to the                            patient.                           -  Resume previous diet.                           - Continue present medications.                           - Await pathology results.                           - Repeat colonoscopy for surveillance next                            available given poor prep with poor visualization. Mauri Pole, MD 05/11/2016 11:34:02 AM This report has been signed electronically.

## 2016-05-12 ENCOUNTER — Encounter: Payer: Self-pay | Admitting: Gastroenterology

## 2016-05-12 ENCOUNTER — Telehealth: Payer: Self-pay

## 2016-05-12 ENCOUNTER — Ambulatory Visit (AMBULATORY_SURGERY_CENTER): Payer: Self-pay

## 2016-05-12 DIAGNOSIS — Z860100 Personal history of colon polyps, unspecified: Secondary | ICD-10-CM

## 2016-05-12 NOTE — Telephone Encounter (Signed)
  Follow up Call-  Call back number 05/11/2016  Post procedure Call Back phone  # [phone]  Permission to leave phone message Yes  Some recent data might be hidden    Patient was called for follow up after his procedure on 05/11/2016. No answer at the number given for follow up phone call. A message was left on the answering machine.

## 2016-05-12 NOTE — Progress Notes (Signed)
No known issues: no allergies to eggs or soy, no home oxygen, no diet meds, no past problems with anesthesia  2 day prep this time; recently not cleaned out for colonoscopy

## 2016-05-16 ENCOUNTER — Ambulatory Visit (AMBULATORY_SURGERY_CENTER): Payer: BC Managed Care – PPO | Admitting: Gastroenterology

## 2016-05-16 ENCOUNTER — Encounter: Payer: Self-pay | Admitting: Gastroenterology

## 2016-05-16 VITALS — BP 151/97 | HR 66 | Temp 98.6°F | Resp 15 | Ht 71.0 in | Wt 284.0 lb

## 2016-05-16 DIAGNOSIS — Z86010 Personal history of colonic polyps: Secondary | ICD-10-CM | POA: Diagnosis not present

## 2016-05-16 MED ORDER — SODIUM CHLORIDE 0.9 % IV SOLN
500.0000 mL | INTRAVENOUS | Status: DC
Start: 2016-05-16 — End: 2017-11-08

## 2016-05-16 NOTE — Op Note (Signed)
[place] Patient Name: Michael Khan Procedure Date: 05/16/2016 8:33 AM MRN: [id number] Endoscopist: Mauri Pole , MD Age: 60 Referring MD:  Date of Birth: [birth year] Gender: Male Account #: [id number] Procedure:                Colonoscopy Indications:              High risk colon cancer surveillance: Personal                            history of colonic polyps, Last colonoscopy 4 days                            ago was inadequate prep Medicines:                Monitored Anesthesia Care Procedure:                Pre-Anesthesia Assessment:                           - Prior to the procedure, a History and Physical                            was performed, and patient medications and                            allergies were reviewed. The patient's tolerance of                            previous anesthesia was also reviewed. The risks                            and benefits of the procedure and the sedation                            options and risks were discussed with the patient.                            All questions were answered, and informed consent                            was obtained. Prior Anticoagulants: The patient has                            taken no previous anticoagulant or antiplatelet                            agents. ASA Grade Assessment: II - A patient with                            mild systemic disease. After reviewing the risks                            and benefits, the patient was deemed in  satisfactory condition to undergo the procedure.                           After obtaining informed consent, the colonoscope                            was passed under direct vision. Throughout the                            procedure, the patient's blood pressure, pulse, and                            oxygen saturations were monitored continuously. The                            Model CF-HQ190L [id number]) scope  was introduced                            through the anus and advanced to the the terminal                            ileum, with identification of the appendiceal                            orifice and IC valve. The colonoscopy was performed                            without difficulty. The patient tolerated the                            procedure well. The quality of the bowel                            preparation was excellent. The terminal ileum,                            ileocecal valve, appendiceal orifice, and rectum                            were photographed. Scope In: 8:44:50 AM Scope Out: 8:55:53 AM Scope Withdrawal Time: 0 hours 6 minutes 55 seconds  Total Procedure Duration: 0 hours 11 minutes 3 seconds  Findings:                 The perianal and digital rectal examinations were                            normal.                           Multiple small and large-mouthed diverticula were                            found in the sigmoid colon and descending colon.  There was no evidence of diverticular bleeding.                            Identified prior site of polypectomy x2 in                            descending colon.                           Non-bleeding internal hemorrhoids were found during                            retroflexion. The hemorrhoids were medium-sized.                           The exam was otherwise without abnormality. Complications:            No immediate complications. Estimated Blood Loss:     Estimated blood loss: none. Impression:               - Diverticulosis in the sigmoid colon and in the                            descending colon. There was no evidence of                            diverticular bleeding.                           - Non-bleeding internal hemorrhoids.                           - The examination was otherwise normal.                           - No specimens collected. Recommendation:            - Patient has a contact number available for                            emergencies. The signs and symptoms of potential                            delayed complications were discussed with the                            patient. Return to normal activities tomorrow.                            Written discharge instructions were provided to the                            patient.                           - Resume previous diet.                           -  Continue present medications.                           - Repeat colonoscopy is recommended for                            surveillance. The colonoscopy date will be                            determined after pathology results from last week's                            exam become available for review. Mauri Pole, MD 05/16/2016 9:01:42 AM This report has been signed electronically.

## 2016-05-16 NOTE — Patient Instructions (Signed)
Impression/recommendations:  Diverticulosis (handout given) High Fiber Diet (handout given) Hemorrhoids (handout given)  YOU HAD AN ENDOSCOPIC PROCEDURE TODAY AT THE [facility]:   Refer to the procedure report that was given to you for any specific questions about what was found during the examination.  If the procedure report does not answer your questions, please call your gastroenterologist to clarify.  If you requested that your care partner not be given the details of your procedure findings, then the procedure report has been included in a sealed envelope for you to review at your convenience later.  YOU SHOULD EXPECT: Some feelings of bloating in the abdomen. Passage of more gas than usual.  Walking can help get rid of the air that was put into your GI tract during the procedure and reduce the bloating. If you had a lower endoscopy (such as a colonoscopy or flexible sigmoidoscopy) you may notice spotting of blood in your stool or on the toilet paper. If you underwent a bowel prep for your procedure, you may not have a normal bowel movement for a few days.  Please Note:  You might notice some irritation and congestion in your nose or some drainage.  This is from the oxygen used during your procedure.  There is no need for concern and it should clear up in a day or so.  SYMPTOMS TO REPORT IMMEDIATELY:   Following lower endoscopy (colonoscopy or flexible sigmoidoscopy):  Excessive amounts of blood in the stool  Significant tenderness or worsening of abdominal pains  Swelling of the abdomen that is new, acute  Fever of 100F or higher  For urgent or emergent issues, a gastroenterologist can be reached at any hour by calling [phone].   DIET:  We do recommend a small meal at first, but then you may proceed to your regular diet.  Drink plenty of fluids but you should avoid alcoholic beverages for 24 hours.  ACTIVITY:  You should plan to take it easy for the rest of  today and you should NOT DRIVE or use heavy machinery until tomorrow (because of the sedation medicines used during the test).    FOLLOW UP: Our staff will call the number listed on your records the next business day following your procedure to check on you and address any questions or concerns that you may have regarding the information given to you following your procedure. If we do not reach you, we will leave a message.  However, if you are feeling well and you are not experiencing any problems, there is no need to return our call.  We will assume that you have returned to your regular daily activities without incident.  If any biopsies were taken you will be contacted by phone or by letter within the next 1-3 weeks.  Please call us at [phone] if you have not heard about the biopsies in 3 weeks.    SIGNATURES/CONFIDENTIALITY: You and/or your care partner have signed paperwork which will be entered into your electronic medical record.  These signatures attest to the fact that that the information above on your After Visit Summary has been reviewed and is understood.  Full responsibility of the confidentiality of this discharge information lies with you and/or your care-partner. 

## 2016-05-16 NOTE — Progress Notes (Signed)
Report given to PACU RN, vss 

## 2016-05-17 ENCOUNTER — Telehealth: Payer: Self-pay

## 2016-05-17 NOTE — Telephone Encounter (Signed)
Left message on answering machine. 

## 2016-05-19 ENCOUNTER — Encounter: Payer: Self-pay | Admitting: Gastroenterology

## 2016-06-15 ENCOUNTER — Ambulatory Visit (INDEPENDENT_AMBULATORY_CARE_PROVIDER_SITE_OTHER): Payer: BC Managed Care – PPO | Admitting: Nurse Practitioner

## 2016-06-15 ENCOUNTER — Encounter: Payer: Self-pay | Admitting: Nurse Practitioner

## 2016-06-15 ENCOUNTER — Other Ambulatory Visit (INDEPENDENT_AMBULATORY_CARE_PROVIDER_SITE_OTHER): Payer: BC Managed Care – PPO

## 2016-06-15 VITALS — BP 142/92 | Temp 97.6°F | Ht 71.0 in | Wt 283.0 lb

## 2016-06-15 DIAGNOSIS — R21 Rash and other nonspecific skin eruption: Secondary | ICD-10-CM | POA: Diagnosis not present

## 2016-06-15 DIAGNOSIS — E785 Hyperlipidemia, unspecified: Secondary | ICD-10-CM | POA: Diagnosis not present

## 2016-06-15 DIAGNOSIS — Z8669 Personal history of other diseases of the nervous system and sense organs: Secondary | ICD-10-CM | POA: Insufficient documentation

## 2016-06-15 LAB — LIPID PANEL
CHOL/HDL RATIO: 3
Cholesterol: 147 mg/dL (ref 0–200)
HDL: 43.8 mg/dL (ref >39.00)
LDL Cholesterol: 85 mg/dL (ref 0–99)
NONHDL: 103.57
TRIGLYCERIDES: 92 mg/dL (ref 0.0–149.0)
VLDL: 18.4 mg/dL (ref 0.0–40.0)

## 2016-06-15 MED ORDER — ROSUVASTATIN CALCIUM 10 MG PO TABS: 10.0000 mg | ORAL_TABLET | Freq: Every day | ORAL | 5 refills | Status: DC

## 2016-06-15 MED ORDER — HYDROXYZINE HCL 25 MG PO TABS: 25.0000 mg | ORAL_TABLET | Freq: Three times a day (TID) | ORAL | 0 refills | Status: DC | PRN

## 2016-06-15 NOTE — Progress Notes (Signed)
Pre visit review using our clinic review tool, if applicable. No additional management support is needed unless otherwise documented below in the visit note. 

## 2016-06-15 NOTE — Progress Notes (Signed)
Subjective:  Patient ID: Michael Khan, male    DOB: 16-Jan-1956  Age: 60 y.o. MRN: [id number]  CC: Rash (follow up Pt stated have rash/open wound have redness/itches for about 6 months.)   Rash  This is a chronic problem. Episode onset: ongoing since 12/2015. The problem has been gradually worsening since onset. The affected locations include the left lower leg, left arm, right lower leg and right arm. The rash is characterized by blistering, itchiness, dryness and peeling. He was exposed to nothing. Pertinent negatives include no anorexia, cough, facial edema, fatigue, fever, joint pain, nail changes, rhinorrhea, shortness of breath, sore throat or vomiting. Past treatments include antibiotic cream, oral steroids and topical steroids. The treatment provided mild relief. There is no history of allergies, asthma, eczema or varicella.   He is running out of crestor, denies any joint or muscle pain.  Outpatient Medications Prior to Visit  Medication Sig Dispense Refill  . triamcinolone ointment (KENALOG) 0.1 % Apply 1 application topically 2 (two) times daily. 30 g 1  . rosuvastatin (CRESTOR) 10 MG tablet Take 1 tablet (10 mg total) by mouth daily. 30 tablet 3   Facility-Administered Medications Prior to Visit  Medication Dose Route Frequency Provider Last Rate Last Dose  . 0.9 %  sodium chloride infusion  500 mL Intravenous Continuous Kavitha V Nandigam, MD      . 0.9 %  sodium chloride infusion  500 mL Intravenous Continuous Mauri Pole, MD        ROS See HPI  Objective:  BP (!) 142/92 (BP Location: Left Arm, Patient Position: Sitting, Cuff Size: Normal)   Temp 97.6 F (36.4 C)   Ht 5\' 11"  (1.803 m)   Wt 283 lb (128.4 kg)   SpO2 97%   BMI 39.47 kg/m   BP Readings from Last 3 Encounters:  06/15/16 (!) 142/92  05/16/16 (!) 151/97  05/11/16 125/66    Wt Readings from Last 3 Encounters:  06/15/16 283 lb (128.4 kg)  05/16/16 284 lb (128.8 kg)  05/11/16 284 lb (128.8  kg)    Physical Exam  Constitutional: He is oriented to person, place, and time.  Cardiovascular: Normal rate.   Pulmonary/Chest: Effort normal.  Neurological: He is alert and oriented to person, place, and time.  Skin: Skin is warm and dry. Rash noted. Rash is maculopapular and nodular.     Hyperpigmented skin with excoriation.    Lab Results  Component Value Date   WBC 9.7 02/18/2016   HGB 13.8 02/18/2016   HCT 41.5 02/18/2016   PLT 243.0 02/18/2016   GLUCOSE 106 (H) 02/18/2016   CHOL 147 06/15/2016   TRIG 92.0 06/15/2016   HDL 43.80 06/15/2016   LDLCALC 85 06/15/2016   ALT 22 02/18/2016   AST 19 02/18/2016   NA 135 02/18/2016   K 4.2 02/18/2016   CL 105 02/18/2016   CREATININE 1.03 02/18/2016   BUN 20 02/18/2016   CO2 28 02/18/2016   PSA 0.32 02/18/2016   INR 1.0 10/03/2008   HGBA1C 5.9 02/18/2016    No results found.  Assessment & Plan:   Michael Khan was seen today for rash.  Diagnoses and all orders for this visit:  Papular rash, generalized -     hydrOXYzine (ATARAX/VISTARIL) 25 MG tablet; Take 1 tablet (25 mg total) by mouth 3 (three) times daily as needed for itching. -     Cancel: Ambulatory referral to Dermatology -     Ambulatory referral to Dermatology  Hyperlipidemia, unspecified hyperlipidemia type -     Lipid panel; Future -     rosuvastatin (CRESTOR) 10 MG tablet; Take 1 tablet (10 mg total) by mouth daily.   I am having Michael Khan start on hydrOXYzine. I am also having him maintain his triamcinolone ointment and rosuvastatin. We will continue to administer sodium chloride and sodium chloride.  Meds ordered this encounter  Medications  . hydrOXYzine (ATARAX/VISTARIL) 25 MG tablet    Sig: Take 1 tablet (25 mg total) by mouth 3 (three) times daily as needed for itching.    Dispense:  30 tablet    Refill:  0    Order Specific Question:   Supervising Provider    Answer:   Cassandria Anger [[id number]]  . rosuvastatin (CRESTOR) 10 MG tablet     Sig: Take 1 tablet (10 mg total) by mouth daily.    Dispense:  30 tablet    Refill:  5    Order Specific Question:   Supervising Provider    Answer:   Cassandria Anger [[id number]]   Recent Results (from the past 2160 hour(s))  Lipid panel     Status: None   Collection Time: 06/15/16  2:00 PM  Result Value Ref Range   Cholesterol 147 0 - 200 mg/dL    Comment: ATP III Classification       Desirable:  < 200 mg/dL               Borderline High:  200 - 239 mg/dL          High:  > = 240 mg/dL   Triglycerides 92.0 0.0 - 149.0 mg/dL    Comment: Normal:  <150 mg/dLBorderline High:  150 - 199 mg/dL   HDL 43.80 >39.00 mg/dL   VLDL 18.4 0.0 - 40.0 mg/dL   LDL Cholesterol 85 0 - 99 mg/dL   Total CHOL/HDL Ratio 3     Comment:                Men          Women1/2 Average Risk     3.4          3.3Average Risk          5.0          4.42X Average Risk          9.6          7.13X Average Risk          15.0          11.0                       NonHDL 103.57     Comment: NOTE:  Non-HDL goal should be 30 mg/dL higher than patient's LDL goal (i.e. LDL goal of < 70 mg/dL, would have non-HDL goal of < 100 mg/dL)   Follow-up: Return in about 6 months (around 12/14/2016) for lipid.  Wilfred Lacy, NP

## 2016-06-15 NOTE — Patient Instructions (Addendum)
You will be called with appt with another dermatologist. Hold triamcinolone cream till   Go to basement for lab draw. Will call with results.

## 2016-06-27 ENCOUNTER — Other Ambulatory Visit: Payer: Self-pay | Admitting: *Deleted

## 2016-06-27 DIAGNOSIS — E785 Hyperlipidemia, unspecified: Secondary | ICD-10-CM

## 2016-06-27 MED ORDER — ROSUVASTATIN CALCIUM 10 MG PO TABS: 10.0000 mg | ORAL_TABLET | Freq: Every day | ORAL | 5 refills | Status: DC

## 2016-06-27 NOTE — Telephone Encounter (Signed)
Rec'd call pt is requesting refill on his crestor. Verified pharmacy inform will send to [facility]...Johny Chess

## 2016-09-18 ENCOUNTER — Other Ambulatory Visit: Payer: Self-pay | Admitting: Family

## 2016-09-18 DIAGNOSIS — R21 Rash and other nonspecific skin eruption: Secondary | ICD-10-CM

## 2016-10-27 ENCOUNTER — Encounter (HOSPITAL_COMMUNITY): Payer: Self-pay | Admitting: Emergency Medicine

## 2016-10-27 ENCOUNTER — Ambulatory Visit (HOSPITAL_COMMUNITY)
Admission: EM | Admit: 2016-10-27 | Discharge: 2016-10-27 | Disposition: A | Discharge status: Home / Self Care | Payer: BC Managed Care – PPO | Attending: Internal Medicine | Admitting: Internal Medicine

## 2016-10-27 DIAGNOSIS — J0100 Acute maxillary sinusitis, unspecified: Secondary | ICD-10-CM | POA: Diagnosis not present

## 2016-10-27 MED ORDER — AMOXICILLIN 875 MG PO TABS: 875.0000 mg | ORAL_TABLET | Freq: Two times a day (BID) | ORAL | 0 refills | Status: DC

## 2016-10-27 MED ORDER — METHYLPREDNISOLONE 4 MG PO TBPK: ORAL_TABLET | ORAL | 0 refills | Status: DC

## 2016-10-27 MED ORDER — IPRATROPIUM BROMIDE 0.06 % NA SOLN: 2.0000 | Freq: Four times a day (QID) | NASAL | 0 refills | Status: DC

## 2016-10-27 NOTE — ED Provider Notes (Signed)
CSN: [id number]     Arrival date & time 10/27/16  1556 History   None    Chief Complaint  Patient presents with  . URI   (Consider location/radiation/quality/duration/timing/severity/associated sxs/prior Treatment) C/o sinus congestion pressure, fatigue, and uri sx's for over 2 weeks.   The history is provided by the patient.  URI  Presenting symptoms: congestion, facial pain, fatigue, fever and rhinorrhea   Severity:  Moderate Onset quality:  Gradual Duration:  2 weeks Timing:  Constant Chronicity:  New Relieved by:  Nothing Worsened by:  Nothing Ineffective treatments:  None tried   Past Medical History:  Diagnosis Date  . Chicken pox   . Guillain Barr syndrome ([place])    When he was 6, he received a flu shot caused him to develop Guillain Barre Syndrome  . Guillain Barr syndrome ([place])   . Hx of Guillain-Barre syndrome 06/15/2016   As child secondary to influenza vaccine  . Hyperlipemia   . Measles    Past Surgical History:  Procedure Laterality Date  . ANKLE RECONSTRUCTION    . CHOLECYSTECTOMY    . NOSE SURGERY    . WRIST RECONSTRUCTION     Family History  Problem Relation Age of Onset  . Liver cancer Mother   . Healthy Father   . Colon cancer Neg Hx    Social History  Substance Use Topics  . Smoking status: Never Smoker  . Smokeless tobacco: Never Used  . Alcohol use No    Review of Systems  Constitutional: Positive for fatigue and fever.  HENT: Positive for congestion and rhinorrhea.   Eyes: Negative.   Respiratory: Negative.   Cardiovascular: Negative.   Gastrointestinal: Negative.   Endocrine: Negative.   Genitourinary: Negative.   Musculoskeletal: Negative.   Neurological: Negative.   Hematological: Negative.     Allergies  Patient has no known allergies.  Home Medications   Prior to Admission medications   Medication Sig Start Date End Date Taking? Authorizing Provider  rosuvastatin (CRESTOR) 10 MG tablet Take 1 tablet (10 mg total)  by mouth daily. 06/27/16  Yes Mode Circle, FNP  amoxicillin (AMOXIL) 875 MG tablet Take 1 tablet (875 mg total) by mouth 2 (two) times daily. 10/27/16   Lysbeth Penner, FNP  hydrOXYzine (ATARAX/VISTARIL) 25 MG tablet Take 1 tablet (25 mg total) by mouth 3 (three) times daily as needed for itching. 06/15/16   Charlene Brooke Nche, NP  ipratropium (ATROVENT) 0.06 % nasal spray Place 2 sprays into both nostrils 4 (four) times daily. 10/27/16   Lysbeth Penner, FNP  methylPREDNISolone (MEDROL DOSEPAK) 4 MG TBPK tablet Take 6-5-4-3-2-1 po qd 10/27/16   Lysbeth Penner, FNP  triamcinolone ointment (KENALOG) 0.1 % APPLY OINTMENT TOPICALLY TO AFFECTED AREA(S) TWICE DAILY 09/19/16   Bigler Circle, FNP   Meds Ordered and Administered this Visit  Medications - No data to display  BP 147/77 (BP Location: Left Arm)   Pulse 72   Temp 98.7 F (37.1 C) (Oral)   Resp 16   SpO2 95%  No data found.   Physical Exam  Constitutional: He appears well-developed and well-nourished.  HENT:  Head: Normocephalic and atraumatic.  Right Ear: External ear normal.  Left Ear: External ear normal.  Mouth/Throat: Oropharynx is clear and moist.  Eyes: Conjunctivae and EOM are normal. Pupils are equal, round, and reactive to light.  Neck: Normal range of motion. Neck supple.  Cardiovascular: Normal rate, regular rhythm, normal heart sounds and intact distal pulses.  Pulmonary/Chest: Effort normal and breath sounds normal.  Abdominal: Soft. Bowel sounds are normal.  Nursing note and vitals reviewed.   Urgent Care Course     Procedures (including critical care time)  Labs Review Labs Reviewed - No data to display  Imaging Review No results found.   Visual Acuity Review  Right Eye Distance:   Left Eye Distance:   Bilateral Distance:    Right Eye Near:   Left Eye Near:    Bilateral Near:         MDM   1. Subacute maxillary sinusitis    Amoxicillin 875mg  one po bid x 10 days #20 Medrol  dose pack as directed 4mg  #21 Atrovent Nasal Spray 0.06% 2 sprays per nostril tid prn #11ml  Push po fluids, rest, tylenol and motrin otc prn as directed for fever, arthralgias, and myalgias.  Follow up prn if sx's continue or persist.    Lysbeth Penner, FNP 10/27/16 [phone]

## 2016-10-27 NOTE — ED Triage Notes (Signed)
Pt c/o cold sx onset: 2 weeks  Sx include: nasal congestion/drainage, watery eyes, itchy eyes, PND, bilateral clogged ears  Denies: fevers  Taking: OTC cold meds w/temp relief.   A&O x4... NAD

## 2016-11-16 ENCOUNTER — Other Ambulatory Visit: Payer: Self-pay | Admitting: Family

## 2016-11-16 DIAGNOSIS — R21 Rash and other nonspecific skin eruption: Secondary | ICD-10-CM

## 2017-01-19 ENCOUNTER — Telehealth: Payer: Self-pay | Admitting: *Deleted

## 2017-01-19 NOTE — Telephone Encounter (Signed)
Rec'd call pt states he is needing refill on the cream & Hydroxyzine pills MD rx for the rash on his leg. Rash is still there, and itching. Also want a note from MD stating that the Hydroxyzine may cause drowsiness when taken for his job...Michael Khan

## 2017-01-19 NOTE — Telephone Encounter (Signed)
Recommend follow up with dermatology as a referral was previously placed or needs office visit as he has not been seen since October.

## 2017-01-20 NOTE — Telephone Encounter (Signed)
Pt has an appt for 6/4. Asked about his note for work if he could get this so he does not lose his job. He will come pick it up.

## 2017-01-24 NOTE — Telephone Encounter (Signed)
I am not able to write a note to excuse him from work since he has not been seen since October.

## 2017-01-24 NOTE — Telephone Encounter (Signed)
Please advise on not for work

## 2017-01-25 NOTE — Telephone Encounter (Signed)
LVM for pt to call back in regards. Need more details on what is needed in work note. The medication he is asking [place] to write about has not been prescribed since 05/2016 so he should technically not have anymore hydralazine.

## 2017-01-30 ENCOUNTER — Ambulatory Visit (INDEPENDENT_AMBULATORY_CARE_PROVIDER_SITE_OTHER): Payer: BC Managed Care – PPO | Admitting: Family

## 2017-01-30 ENCOUNTER — Encounter: Payer: Self-pay | Admitting: Family

## 2017-01-30 VITALS — BP 156/78 | HR 75 | Temp 98.0°F | Resp 16 | Ht 71.0 in | Wt 284.4 lb

## 2017-01-30 DIAGNOSIS — R21 Rash and other nonspecific skin eruption: Secondary | ICD-10-CM

## 2017-01-30 DIAGNOSIS — R252 Cramp and spasm: Secondary | ICD-10-CM

## 2017-01-30 MED ORDER — CLOBETASOL PROPIONATE 0.05 % EX CREA: 1.0000 "application " | TOPICAL_CREAM | Freq: Two times a day (BID) | CUTANEOUS | 0 refills | Status: DC

## 2017-01-30 MED ORDER — CLOTRIMAZOLE-BETAMETHASONE 1-0.05 % EX CREA: 1.0000 "application " | TOPICAL_CREAM | Freq: Two times a day (BID) | CUTANEOUS | 0 refills | Status: DC

## 2017-01-30 NOTE — Progress Notes (Signed)
Subjective:    Patient ID: Michael Khan, male    DOB: 12/23/1955, 61 y.o.   MRN: [id number]  Chief Complaint  Patient presents with  . Rash    Rash on legs and does not want to go to another specialist    HPI:  Michael Khan is a 61 y.o. male who  has a past medical history of Chicken pox; Guillain Barr syndrome ([place]); Guillain Barr syndrome ([place]); Guillain-Barre syndrome (06/15/2016); Hyperlipemia; and Measles. and presents today for a follow up office visit.  1.) Rash - Continues to experience the associated symptoms of a papular rash that has been refractory to cortisone cream and hydroxyzine. Recently attempted to change skin care products to sensitive skin type products which have helped a little. Notes that the triamcinalone has helped especially when "his pores are open."  Rash continues on bilateral calves with one or two spots located on his forearms.   2.) Calf cramp - This is a new problem. Associated symptom of calf cramping located in his right lower extremity has been going on for several months. Denies trauma, injury, chest pain or shortness of breath. Symptoms are aggravated when stretching and improved with rest. Denies any modifying factors or attempted treatments.   No Known Allergies    Outpatient Medications Prior to Visit  Medication Sig Dispense Refill  . amoxicillin (AMOXIL) 875 MG tablet Take 1 tablet (875 mg total) by mouth 2 (two) times daily. 20 tablet 0  . hydrOXYzine (ATARAX/VISTARIL) 25 MG tablet Take 1 tablet (25 mg total) by mouth 3 (three) times daily as needed for itching. 30 tablet 0  . ipratropium (ATROVENT) 0.06 % nasal spray Place 2 sprays into both nostrils 4 (four) times daily. 15 mL 0  . methylPREDNISolone (MEDROL DOSEPAK) 4 MG TBPK tablet Take 6-5-4-3-2-1 po qd 21 tablet 0  . rosuvastatin (CRESTOR) 10 MG tablet Take 1 tablet (10 mg total) by mouth daily. 30 tablet 5  . triamcinolone ointment (KENALOG) 0.1 % APPLY TOPICALLY TO AFFECTED  AREAS TWICE DAILY 30 g 1   Facility-Administered Medications Prior to Visit  Medication Dose Route Frequency Provider Last Rate Last Dose  . 0.9 %  sodium chloride infusion  500 mL Intravenous Continuous Nandigam, Kavitha V, MD      . 0.9 %  sodium chloride infusion  500 mL Intravenous Continuous Nandigam, Venia Minks, MD          Past Surgical History:  Procedure Laterality Date  . ANKLE RECONSTRUCTION    . CHOLECYSTECTOMY    . NOSE SURGERY    . WRIST RECONSTRUCTION        Past Medical History:  Diagnosis Date  . Chicken pox   . Guillain Barr syndrome ([place])    When he was 50, he received a flu shot caused him to develop Guillain Barre Syndrome  . Guillain Barr syndrome ([place])   . Hx of Guillain-Barre syndrome 06/15/2016   As child secondary to influenza vaccine  . Hyperlipemia   . Measles       Review of Systems  Constitutional: Negative for chills and fever.  Respiratory: Negative for chest tightness and shortness of breath.   Cardiovascular: Negative for chest pain, palpitations and leg swelling.  Musculoskeletal:       Positive for muscle cramping.   Skin: Positive for rash.      Objective:    BP (!) 156/78 (BP Location: Left Arm, Patient Position: Sitting, Cuff Size: Large)   Pulse 75  Temp 98 F (36.7 C) (Oral)   Resp 16   Ht 5\' 11"  (1.803 m)   Wt 284 lb 6.4 oz (129 kg)   SpO2 96%   BMI 39.67 kg/m  Nursing note and vital signs reviewed.  Physical Exam  Constitutional: He is oriented to person, place, and time. He appears well-developed and well-nourished. No distress.  Cardiovascular: Normal rate, regular rhythm, normal heart sounds and intact distal pulses.  Exam reveals no gallop and no friction rub.   No murmur heard. Negative Homan's sign.   Pulmonary/Chest: Effort normal and breath sounds normal. No respiratory distress. He has no wheezes. He has no rales. He exhibits no tenderness.  Neurological: He is alert and oriented to person, place, and  time.  Skin: Skin is warm and dry. Rash (Tan/brownish lesion in various stages of healing with some papules with pink centers. ) noted.  Psychiatric: He has a normal mood and affect. His behavior is normal. Judgment and thought content normal.       Assessment & Plan:   Problem List Items Addressed This Visit      Musculoskeletal and Integument   Rash and nonspecific skin eruption - Primary    Continues to experience rash that improves slightly with steroid creams. Decline biopsy by Dermatology with patient refusal to see an additional specialist. Concern for possible vasculitis versus eczema. Start Lotrisone. Consider oral prednisone if symptoms worsen or do not improve.         Other   Muscle cramps    New onset muscle cramps with concern for claudication given increased symptoms with movement and improved with rest. Obtain ankle-brachial index. Recommend icing regimen and stretching pending ABI results.       Relevant Orders   VAS [place] ABI WITH/WO TBI       I have discontinued Mr. Koopmann triamcinolone ointment and clobetasol cream. I am also having him start on clotrimazole-betamethasone. Additionally, I am having him maintain his hydrOXYzine, rosuvastatin, methylPREDNISolone, amoxicillin, and ipratropium. We will continue to administer sodium chloride and sodium chloride.   Meds ordered this encounter  Medications  . DISCONTD: clobetasol cream (TEMOVATE) 0.05 %    Sig: Apply 1 application topically 2 (two) times daily.    Dispense:  30 g    Refill:  0    Order Specific Question:   Supervising Provider    Answer:   Pricilla Holm A [[id number]]  . clotrimazole-betamethasone (LOTRISONE) cream    Sig: Apply 1 application topically 2 (two) times daily.    Dispense:  45 g    Refill:  0    Please discontinue previous clobetasol cream.    Order Specific Question:   Supervising Provider    Answer:   Pricilla Holm A [[id number]]     Follow-up: Return in about 1 month (around  03/01/2017), or if symptoms worsen or fail to improve.  Mauricio Po, FNP

## 2017-01-30 NOTE — Assessment & Plan Note (Signed)
New onset muscle cramps with concern for claudication given increased symptoms with movement and improved with rest. Obtain ankle-brachial index. Recommend icing regimen and stretching pending ABI results.

## 2017-01-30 NOTE — Patient Instructions (Signed)
Thank you for choosing [facility].  SUMMARY AND INSTRUCTIONS:  You will receive a call to schedule your vascular test.   Start the Lotrisone cream.  If no improvement we may try oral steroids.  Medication:  Your prescription(s) have been submitted to your pharmacy or been printed and provided for you. Please take as directed and contact our office if you believe you are having problem(s) with the medication(s) or have any questions.  Follow up:  If your symptoms worsen or fail to improve, please contact our office for further instruction, or in case of emergency go directly to the emergency room at the closest medical facility.

## 2017-01-30 NOTE — Assessment & Plan Note (Signed)
Continues to experience rash that improves slightly with steroid creams. Decline biopsy by Dermatology with patient refusal to see an additional specialist. Concern for possible vasculitis versus eczema. Start Lotrisone. Consider oral prednisone if symptoms worsen or do not improve.

## 2017-01-30 NOTE — Progress Notes (Signed)
Was

## 2017-02-24 ENCOUNTER — Ambulatory Visit (HOSPITAL_COMMUNITY)
Admission: RE | Admit: 2017-02-24 | Discharge: 2017-02-24 | Disposition: A | Discharge status: Home / Self Care | Payer: BC Managed Care – PPO | Source: Ambulatory Visit | Attending: Vascular Surgery | Admitting: Vascular Surgery

## 2017-02-24 DIAGNOSIS — R252 Cramp and spasm: Secondary | ICD-10-CM | POA: Insufficient documentation

## 2017-02-24 DIAGNOSIS — E785 Hyperlipidemia, unspecified: Secondary | ICD-10-CM | POA: Diagnosis not present

## 2017-09-06 ENCOUNTER — Ambulatory Visit: Payer: Self-pay | Admitting: Emergency Medicine

## 2017-09-07 ENCOUNTER — Encounter: Payer: Self-pay | Admitting: Family Medicine

## 2017-09-07 ENCOUNTER — Telehealth: Payer: Self-pay | Admitting: *Deleted

## 2017-09-07 ENCOUNTER — Ambulatory Visit: Payer: 59 | Admitting: Family Medicine

## 2017-09-07 ENCOUNTER — Other Ambulatory Visit: Payer: Self-pay

## 2017-09-07 VITALS — BP 148/84 | HR 86 | Temp 97.7°F | Ht 69.29 in | Wt 295.0 lb

## 2017-09-07 DIAGNOSIS — H539 Unspecified visual disturbance: Secondary | ICD-10-CM

## 2017-09-07 DIAGNOSIS — Z125 Encounter for screening for malignant neoplasm of prostate: Secondary | ICD-10-CM

## 2017-09-07 DIAGNOSIS — E785 Hyperlipidemia, unspecified: Secondary | ICD-10-CM | POA: Diagnosis not present

## 2017-09-07 DIAGNOSIS — Z0000 Encounter for general adult medical examination without abnormal findings: Secondary | ICD-10-CM

## 2017-09-07 DIAGNOSIS — R030 Elevated blood-pressure reading, without diagnosis of hypertension: Secondary | ICD-10-CM

## 2017-09-07 LAB — POCT URINALYSIS DIP (MANUAL ENTRY)
Bilirubin, UA: NEGATIVE
Blood, UA: NEGATIVE
Glucose, UA: NEGATIVE mg/dL
Ketones, POC UA: NEGATIVE mg/dL
Leukocytes, UA: NEGATIVE
Nitrite, UA: NEGATIVE
Protein Ur, POC: NEGATIVE mg/dL
Spec Grav, UA: 1.025 (ref 1.010–1.025)
Urobilinogen, UA: 1 E.U./dL
pH, UA: 5.5 (ref 5.0–8.0)

## 2017-09-07 NOTE — Progress Notes (Signed)
1/10/20198:49 AM  Michael Khan Apr 23, 1956, 62 y.o. male [id number]  Chief Complaint  Patient presents with  . Annual Exam    NEED BLOOD WORK FOR YOUR JOB     HPI:   Patient is a 62 y.o. male who presents today for annual exam as required by employment. Last CPE 2017.  Colorectal Cancer Screening: 04/2016, polyps x 3, tubular adenomas Prostate Cancer Screening: 2017,negative HIV Screening: 2016 HCV Screening: 2017 Seasonal Influenza Vaccination: contraindicated Td/Tdap Vaccination: UTD Pneumococcal Vaccination: n/a Frequency of Dental evaluation: needs to establish with new dentist Frequency of Eye evaluation: does not see eye doctor often, however having issues with left eye, reports a fixed brown visual spot that started after he was working in his yard, has not resolved despite eye wash at home, denies any changes in vision or eye pain   Depression screen [facility] 2/9 09/07/2017 04/10/2015  Decreased Interest 0 0  Down, Depressed, Hopeless 0 0  PHQ - 2 Score 0 0    No Known Allergies  Prior to Admission medications   Medication Sig Start Date End Date Taking? Authorizing Provider  ipratropium (ATROVENT) 0.06 % nasal spray Place 2 sprays into both nostrils 4 (four) times daily. Patient not taking: Reported on 09/07/2017 10/27/16   Lysbeth Penner, FNP  rosuvastatin (CRESTOR) 10 MG tablet Take 1 tablet (10 mg total) by mouth daily. Patient not taking: Reported on 09/07/2017 06/27/16   [place] Circle, FNP    Past Medical History:  Diagnosis Date  . Chicken pox   . Guillain Barr syndrome ([place])    When he was 73, he received a flu shot caused him to develop Guillain Barre Syndrome  . Guillain Barr syndrome ([place])   . Hx of Guillain-Barre syndrome 06/15/2016   As child secondary to influenza vaccine  . Hyperlipemia   . Measles     Past Surgical History:  Procedure Laterality Date  . ANKLE RECONSTRUCTION    . CHOLECYSTECTOMY    . NOSE SURGERY    . WRIST  RECONSTRUCTION      Social History   Tobacco Use  . Smoking status: Never Smoker  . Smokeless tobacco: Never Used  Substance Use Topics  . Alcohol use: No    Family History  Problem Relation Age of Onset  . Liver cancer Mother   . Healthy Father   . Colon cancer Neg Hx     Review of Systems  Constitutional: Negative for chills, fever and malaise/fatigue.  HENT: Positive for congestion. Negative for ear pain, hearing loss and sore throat.   Respiratory: Negative for cough and shortness of breath.   Cardiovascular: Negative for chest pain, palpitations and leg swelling.  Gastrointestinal: Negative for abdominal pain, constipation, diarrhea, nausea and vomiting.  Genitourinary: Negative for dysuria and hematuria.  Musculoskeletal: Negative for myalgias.  Neurological: Negative for sensory change, speech change and focal weakness.  Psychiatric/Behavioral: Negative for depression. The patient is not nervous/anxious.      OBJECTIVE:  Blood pressure (!) 148/84, pulse 86, temperature 97.7 F (36.5 C), temperature source Oral, height 5' 9.29" (1.76 m), weight 295 lb (133.8 kg), SpO2 97 %.   Visual Acuity Screening   Right eye Left eye Both eyes  Without correction: 20/20 20/20 20/13   With correction:       Physical Exam  Constitutional: He is oriented to person, place, and time and well-developed, well-nourished, and in no distress.  HENT:  Head: Normocephalic and atraumatic.  Right Ear: Hearing, tympanic membrane,  external ear and ear canal normal.  Left Ear: Hearing, tympanic membrane, external ear and ear canal normal.  Mouth/Throat: Oropharynx is clear and moist. No oropharyngeal exudate.  Eyes: Conjunctivae and EOM are normal. Pupils are equal, round, and reactive to light.  Slit lamp exam:      The left eye shows no fluorescein uptake.  Neck: Neck supple. No thyromegaly present.  Cardiovascular: Normal rate, regular rhythm, normal heart sounds and intact distal  pulses. Exam reveals no gallop and no friction rub.  No murmur heard. Pulmonary/Chest: Effort normal and breath sounds normal. He has no wheezes. He has no rales.  Abdominal: Soft. Bowel sounds are normal. He exhibits no distension and no mass. There is no tenderness.  Musculoskeletal: Normal range of motion. He exhibits no edema.  Lymphadenopathy:    He has no cervical adenopathy.  Neurological: He is alert and oriented to person, place, and time. He has normal reflexes. No cranial nerve deficit. Gait normal.  Skin: Skin is warm and dry.  Psychiatric: Mood and affect normal.    Results for orders placed or performed in visit on 09/07/17 (from the past 24 hour(s))  POCT urinalysis dipstick     Status: None   Collection Time: 09/07/17  8:50 AM  Result Value Ref Range   Color, UA yellow yellow   Clarity, UA clear clear   Glucose, UA negative negative mg/dL   Bilirubin, UA negative negative   Ketones, POC UA negative negative mg/dL   Spec Grav, UA 1.025 1.010 - 1.025   Blood, UA negative negative   pH, UA 5.5 5.0 - 8.0   Protein Ur, POC negative negative mg/dL   Urobilinogen, UA 1.0 0.2 or 1.0 E.U./dL   Nitrite, UA Negative Negative   Leukocytes, UA Negative Negative      ASSESSMENT and PLAN  1. Annual physical exam Routine HCM labs ordered. HCM reviewed/discussed. Anticipatory guidance regarding healthy weight, lifestyle and choices given. Form for employer completed and faxed.   - POCT urinalysis dipstick - CBC - Comprehensive metabolic panel - Lipid panel - TSH - Hemoglobin A1c - PSA  2. Prostate cancer screening  3. Hyperlipidemia, unspecified hyperlipidemia type  4. Visual disturbance - amb ref to opthomology  5. Elevated BP without diagnosis of hypertension - Recheck BP in 1-2 weeks  Return for 1-2 weeks for BP check.    Rutherford Guys, MD [facility] at [facility], [place] [address] Ph.  [phone] Fax [phone]

## 2017-09-07 NOTE — Patient Instructions (Addendum)
1. Blood pressure elevated today in clinic. Please come back for blood pressure check in 1-2 weeks. thanks    IF you received an x-ray today, you will receive an invoice from [facility]. Please contact [facility] at [phone] with questions or concerns regarding your invoice.   IF you received labwork today, you will receive an invoice from [facility]. Please contact [facility] at [phone] with questions or concerns regarding your invoice.   Our billing staff will not be able to assist you with questions regarding bills from these companies.  You will be contacted with the lab results as soon as they are available. The fastest way to get your results is to activate your My Chart account. Instructions are located on the last page of this paperwork. If you have not heard from [place] regarding the results in 2 weeks, please contact this office.     Preventive Care 40-64 Years, Male Preventive care refers to lifestyle choices and visits with your health care provider that can promote health and wellness. What does preventive care include?  A yearly physical exam. This is also called an annual well check.  Dental exams once or twice a year.  Routine eye exams. Ask your health care provider how often you should have your eyes checked.  Personal lifestyle choices, including: ? Daily care of your teeth and gums. ? Regular physical activity. ? Eating a healthy diet. ? Avoiding tobacco and drug use. ? Limiting alcohol use. ? Practicing safe sex. ? Taking low-dose aspirin every day starting at age 45. What happens during an annual well check? The services and screenings done by your health care provider during your annual well check will depend on your age, overall health, lifestyle risk factors, and family history of disease. Counseling Your health care provider may ask you questions about your:  Alcohol use.  Tobacco use.  Drug use.  Emotional well-being.  Home  and relationship well-being.  Sexual activity.  Eating habits.  Work and work Statistician.  Screening You may have the following tests or measurements:  Height, weight, and BMI.  Blood pressure.  Lipid and cholesterol levels. These may be checked every 5 years, or more frequently if you are over 61 years old.  Skin check.  Lung cancer screening. You may have this screening every year starting at age 40 if you have a 30-pack-year history of smoking and currently smoke or have quit within the past 15 years.  Fecal occult blood test (FOBT) of the stool. You may have this test every year starting at age 37.  Flexible sigmoidoscopy or colonoscopy. You may have a sigmoidoscopy every 5 years or a colonoscopy every 10 years starting at age 64.  Prostate cancer screening. Recommendations will vary depending on your family history and other risks.  Hepatitis C blood test.  Hepatitis B blood test.  Sexually transmitted disease (STD) testing.  Diabetes screening. This is done by checking your blood sugar (glucose) after you have not eaten for a while (fasting). You may have this done every 1-3 years.  Discuss your test results, treatment options, and if necessary, the need for more tests with your health care provider. Vaccines Your health care provider may recommend certain vaccines, such as:  Influenza vaccine. This is recommended every year.  Tetanus, diphtheria, and acellular pertussis (Tdap, Td) vaccine. You may need a Td booster every 10 years.  Varicella vaccine. You may need this if you have not been vaccinated.  Zoster vaccine. You may need this  after age 8.  Measles, mumps, and rubella (MMR) vaccine. You may need at least one dose of MMR if you were born in 1957 or later. You may also need a second dose.  Pneumococcal 13-valent conjugate (PCV13) vaccine. You may need this if you have certain conditions and have not been vaccinated.  Pneumococcal polysaccharide  (PPSV23) vaccine. You may need one or two doses if you smoke cigarettes or if you have certain conditions.  Meningococcal vaccine. You may need this if you have certain conditions.  Hepatitis A vaccine. You may need this if you have certain conditions or if you travel or work in places where you may be exposed to hepatitis A.  Hepatitis B vaccine. You may need this if you have certain conditions or if you travel or work in places where you may be exposed to hepatitis B.  Haemophilus influenzae type b (Hib) vaccine. You may need this if you have certain risk factors.  Talk to your health care provider about which screenings and vaccines you need and how often you need them. This information is not intended to replace advice given to you by your health care provider. Make sure you discuss any questions you have with your health care provider. Document Released: 09/11/2015 Document Revised: 05/04/2016 Document Reviewed: 06/16/2015 Elsevier Interactive Patient Education  [employer].

## 2017-09-08 LAB — CBC
Hematocrit: 43 % (ref 37.5–51.0)
Hemoglobin: 14.4 g/dL (ref 13.0–17.7)
MCH: 30.8 pg (ref 26.6–33.0)
MCHC: 33.5 g/dL (ref 31.5–35.7)
MCV: 92 fL (ref 79–97)
Platelets: 261 10*3/uL (ref 150–379)
RBC: 4.68 x10E6/uL (ref 4.14–5.80)
RDW: 14.2 % (ref 12.3–15.4)
WBC: 8 10*3/uL (ref 3.4–10.8)

## 2017-09-08 LAB — LIPID PANEL
Chol/HDL Ratio: 5.5 ratio — ABNORMAL HIGH (ref 0.0–5.0)
Cholesterol, Total: 213 mg/dL — ABNORMAL HIGH (ref 100–199)
HDL: 39 mg/dL — ABNORMAL LOW (ref >39)
LDL Calculated: 144 mg/dL — ABNORMAL HIGH (ref 0–99)
Triglycerides: 151 mg/dL — ABNORMAL HIGH (ref 0–149)
VLDL Cholesterol Cal: 30 mg/dL (ref 5–40)

## 2017-09-08 LAB — COMPREHENSIVE METABOLIC PANEL
ALT: 22 IU/L (ref 0–44)
AST: 22 IU/L (ref 0–40)
Albumin/Globulin Ratio: 1.5 (ref 1.2–2.2)
Albumin: 4.3 g/dL (ref 3.6–4.8)
Alkaline Phosphatase: 69 IU/L (ref 39–117)
BUN/Creatinine Ratio: 15 (ref 10–24)
BUN: 17 mg/dL (ref 8–27)
Bilirubin Total: 0.8 mg/dL (ref 0.0–1.2)
CO2: 22 mmol/L (ref 20–29)
Calcium: 9.5 mg/dL (ref 8.6–10.2)
Chloride: 104 mmol/L (ref 96–106)
Creatinine, Ser: 1.14 mg/dL (ref 0.76–1.27)
GFR calc Af Amer: 79 mL/min/{1.73_m2} (ref >59)
GFR calc non Af Amer: 69 mL/min/{1.73_m2} (ref >59)
Globulin, Total: 2.9 g/dL (ref 1.5–4.5)
Glucose: 132 mg/dL — ABNORMAL HIGH (ref 65–99)
Potassium: 4.6 mmol/L (ref 3.5–5.2)
Sodium: 141 mmol/L (ref 134–144)
Total Protein: 7.2 g/dL (ref 6.0–8.5)

## 2017-09-08 LAB — TSH: TSH: 4.6 u[IU]/mL — ABNORMAL HIGH (ref 0.450–4.500)

## 2017-09-08 LAB — HEMOGLOBIN A1C
Est. average glucose Bld gHb Est-mCnc: 128 mg/dL
Hgb A1c MFr Bld: 6.1 % — ABNORMAL HIGH (ref 4.8–5.6)

## 2017-09-08 LAB — PSA: Prostate Specific Ag, Serum: 0.4 ng/mL (ref 0.0–4.0)

## 2017-09-11 ENCOUNTER — Encounter: Payer: Self-pay | Admitting: Family Medicine

## 2017-09-11 ENCOUNTER — Other Ambulatory Visit: Payer: Self-pay | Admitting: Family Medicine

## 2017-09-11 DIAGNOSIS — E785 Hyperlipidemia, unspecified: Secondary | ICD-10-CM

## 2017-09-11 DIAGNOSIS — R7989 Other specified abnormal findings of blood chemistry: Secondary | ICD-10-CM

## 2017-09-11 MED ORDER — ROSUVASTATIN CALCIUM 10 MG PO TABS: 10.0000 mg | ORAL_TABLET | Freq: Every day | ORAL | 5 refills | Status: DC

## 2017-09-22 DIAGNOSIS — H43812 Vitreous degeneration, left eye: Secondary | ICD-10-CM | POA: Diagnosis not present

## 2017-09-22 DIAGNOSIS — Q100 Congenital ptosis: Secondary | ICD-10-CM | POA: Diagnosis not present

## 2017-09-22 DIAGNOSIS — H2513 Age-related nuclear cataract, bilateral: Secondary | ICD-10-CM | POA: Diagnosis not present

## 2017-09-26 NOTE — Telephone Encounter (Signed)
lm

## 2017-11-08 ENCOUNTER — Encounter: Payer: Self-pay | Admitting: Internal Medicine

## 2017-11-08 ENCOUNTER — Ambulatory Visit: Payer: 59 | Admitting: Internal Medicine

## 2017-11-08 VITALS — BP 138/88 | HR 86 | Temp 97.8°F | Ht 69.25 in | Wt 304.0 lb

## 2017-11-08 DIAGNOSIS — L84 Corns and callosities: Secondary | ICD-10-CM | POA: Diagnosis not present

## 2017-11-08 DIAGNOSIS — E785 Hyperlipidemia, unspecified: Secondary | ICD-10-CM

## 2017-11-08 DIAGNOSIS — J309 Allergic rhinitis, unspecified: Secondary | ICD-10-CM | POA: Diagnosis not present

## 2017-11-08 DIAGNOSIS — R739 Hyperglycemia, unspecified: Secondary | ICD-10-CM | POA: Insufficient documentation

## 2017-11-08 LAB — POCT GLYCOSYLATED HEMOGLOBIN (HGB A1C): Hemoglobin A1C: 5.7

## 2017-11-08 MED ORDER — ASPIRIN 81 MG PO TBEC: 81.0000 mg | DELAYED_RELEASE_TABLET | Freq: Every day | ORAL | 12 refills | Status: AC

## 2017-11-08 MED ORDER — ROSUVASTATIN CALCIUM 10 MG PO TABS: 10.0000 mg | ORAL_TABLET | Freq: Every day | ORAL | 3 refills | Status: DC

## 2017-11-08 NOTE — Assessment & Plan Note (Signed)
[place] for referral to podiatry

## 2017-11-08 NOTE — Patient Instructions (Addendum)
Please start the Aspirin 81 mg - 1 per day  Ok to take the OTC Zyrtec and Nasacort for allergies  You will be contacted regarding the referral for: podiatry  Your A1c was OK today at 5.7 - no new treatment needed  OK to restart the Generic crestor for cholesterol  Please continue all other medications as before, and refills have been done if requested.  Please have the pharmacy call with any other refills you may need.  Please continue your efforts at being more active, low cholesterol diet, and weight control  Please keep your appointments with your specialists as you may have planned  Please return in 6 months, or sooner if needed, with Lab testing done 3-5 days before .

## 2017-11-08 NOTE — Assessment & Plan Note (Signed)
Mild to mod, for OTC zyrtec/nasacort asd,,  to f/u any worsening symptoms or concerns

## 2017-11-08 NOTE — Progress Notes (Addendum)
Subjective:    Patient ID: Michael Khan, male    DOB: Jun 15, 1956, 62 y.o.   MRN: [id number]  HPI  Here to f/u; overall doing ok,  Pt denies chest pain, increasing sob or doe, wheezing, orthopnea, PND, increased LE swelling, palpitations, dizziness or syncope.  Pt denies new neurological symptoms such as new headache, or facial or extremity weakness or numbness.  Pt denies polydipsia, polyuria, or low sugar episode.  Pt states overall good compliance with meds, mostly trying to follow appropriate diet, with wt overall stable,  but little exercise however.  Working full Airline pilot for [employer].  Has chronic fatigue he believes related to hx of GulluianBarre 2.5 yr episode of weakness.   Had to stop his crestor in between jobs of moving from [employer] to current  .  Did see a dermatologist for rash to legs and still taking a steroid cream as needed - ? Dermatitis or psoriasis.  Not taking asa 81.  Declines all immunizations. C/o ED symptoms in last 6 mo, but also some urinary urgency and has to rush to bathroom sometimes in the last yea  Has felt best at around 200 - 220 and laments his current weight.  Working 2 jobs with little activity.  Wife does home PD for dialysis with 3 hours per AM for him, hard to find time. Wt Readings from Last 3 Encounters:  11/08/17 (!) 304 lb (137.9 kg)  09/07/17 295 lb (133.8 kg)  01/30/17 284 lb 6.4 oz (129 kg)   Past Medical History:  Diagnosis Date  . Guillain Barr syndrome ([place])    When he was 41, he received a flu shot caused him to develop Guillain Barre Syndrome  . Hyperlipemia   . Measles    Past Surgical History:  Procedure Laterality Date  . ANKLE RECONSTRUCTION    . CHOLECYSTECTOMY    . NOSE SURGERY    . WRIST RECONSTRUCTION      reports that  has never smoked. he has never used smokeless tobacco. He reports that he does not drink alcohol or use drugs. family history includes Healthy in his father; Liver cancer in his mother. No  Known Allergies No current outpatient medications on file prior to visit.   No current facility-administered medications on file prior to visit.    Review of Systems  Constitutional: Negative for other unusual diaphoresis or sweats HENT: Negative for ear discharge or swelling Eyes: Negative for other worsening visual disturbances Respiratory: Negative for stridor or other swelling  Gastrointestinal: Negative for worsening distension or other blood Genitourinary: Negative for retention or other urinary change Musculoskeletal: Negative for other MSK pain or swelling Skin: Negative for color change or other new lesions Neurological: Negative for worsening tremors and other numbness  Psychiatric/Behavioral: Negative for worsening agitation or other fatigue All other system neg per pt    Objective:   Physical Exam BP 138/88   Pulse 86   Temp 97.8 F (36.6 C) (Oral)   Ht 5' 9.25" (1.759 m)   Wt (!) 304 lb (137.9 kg)   SpO2 98%   BMI 44.57 kg/m  VS noted,  Constitutional: Pt appears in NAD HENT: Head: NCAT.  Right Ear: External ear normal.  Left Ear: External ear normal.  Eyes: . Pupils are equal, round, and reactive to light. Conjunctivae and EOM are normal Nose: without d/c or deformity Neck: Neck supple. Gross normal ROM Cardiovascular: Normal rate and regular rhythm.   Pulmonary/Chest: Effort normal and  breath sounds without rales or wheezing.  Abd:  Soft, NT, ND, + BS, no organomegaly Neurological: Pt is alert. At baseline orientation, motor grossly intact Skin: Skin is warm. No rashes, other new lesions, no LE edema Psychiatric: Pt behavior is normal without agitation  No other exam findings    POCT glycosylated hemoglobin (Hb A1C)  Component 09:24  Hemoglobin A1C 5.7         Assessment & Plan:

## 2017-11-08 NOTE — Assessment & Plan Note (Signed)
For a1c in office today, tx pending results, goal a1c < 7

## 2017-11-08 NOTE — Assessment & Plan Note (Signed)
Mod uncontrolled, goal < 100, for crestor 10 qd, f/u labs next visit

## 2018-01-10 ENCOUNTER — Ambulatory Visit (INDEPENDENT_AMBULATORY_CARE_PROVIDER_SITE_OTHER): Payer: 59

## 2018-01-10 ENCOUNTER — Ambulatory Visit: Payer: 59 | Admitting: Podiatry

## 2018-01-10 DIAGNOSIS — M778 Other enthesopathies, not elsewhere classified: Secondary | ICD-10-CM

## 2018-01-10 DIAGNOSIS — M779 Enthesopathy, unspecified: Secondary | ICD-10-CM

## 2018-01-14 NOTE — Progress Notes (Signed)
   HPI: 62 year old male presenting today as a new patient with a chief complaint of a painless callus lesion on the plantar aspect of the left forefoot that has been present for several months. He is concerned it may be a wart. He has not done anything to treat the area. He denies any modifying factors. Patient is here for further evaluation and treatment.   Past Medical History:  Diagnosis Date  . Guillain Barr syndrome ([place])    When he was 45, he received a flu shot caused him to develop Guillain Barre Syndrome  . Hyperlipemia   . Measles      Physical Exam: General: The patient is alert and oriented x3 in no acute distress.  Dermatology: Skin is warm, dry and supple bilateral lower extremities. Negative for open lesions or macerations.  Vascular: Palpable pedal pulses bilaterally. No edema or erythema noted. Capillary refill within normal limits.  Neurological: Epicritic and protective threshold grossly intact bilaterally.   Musculoskeletal Exam: Range of motion within normal limits to all pedal and ankle joints bilateral. Muscle strength 5/5 in all groups bilateral.   Radiographic Exam:  Normal osseous mineralization. Joint spaces preserved. No fracture/dislocation/boney destruction.    Assessment: 1. H/o porokeratosis bilateral - currently asymptomatic    Plan of Care:  1. Patient evaluated. X-Rays reviewed.  2. Recommended good shoe gear.  3. Recommended OTC corn and callus remover as needed.  4. Return to clinic as needed.      Edrick Kins, DPM [facility]  Dr. Edrick Kins, DPM    [address], [place] [address]                Office [phone]  Fax [phone]

## 2018-01-19 ENCOUNTER — Ambulatory Visit: Payer: Self-pay

## 2018-01-19 NOTE — Telephone Encounter (Addendum)
ret'd call to pt.  C/o intermittent bilat. lower leg swelling from ankle up to calves.  Reported this has been occurring intermittently, over past 4-5 mos., but has worsened over the past month.  Denied redness or tenderness of calves. Does report a "red, bruised-like area beneath lateral ankle bone, bilaterally."  Denied open sores of lower extremities.  Reported a blister-like area behind his right knee.  Stated he has shortness of breath if feels rushed, or when bending over.  Denied cough, or chest pain.  Reported he works 2 jobs and is on his feet, or sitting a lot, at times.  Advised pt. he needs an appt.  Stated he cannot come to office appt., until next Wednesday, due to his work schedule. Appt. given for 01/24/18 with PCP.  Care advice given per protocol.  Verb. Understanding.       Reason for Disposition . [1] MODERATE leg swelling (e.g., swelling extends up to knees) AND [2] new onset or worsening  Answer Assessment - Initial Assessment Questions 1. ONSET: "When did the swelling start?" (e.g., minutes, hours, days)     About one month ago  2. LOCATION: "What part of the leg is swollen?"  "Are both legs swollen or just one leg?"     Swelling in ankles and lower legs to calves; equally swollen   3. SEVERITY: "How bad is the swelling?" (e.g., localized; mild, moderate, severe)  - Localized - small area of swelling localized to one leg  - MILD pedal edema - swelling limited to foot and ankle, pitting edema < 1/4 inch (6 mm) deep, rest and elevation eliminate most or all swelling  - MODERATE edema - swelling of lower leg to knee, pitting edema > 1/4 inch (6 mm) deep, rest and elevation only partially reduce swelling  - SEVERE edema - swelling extends above knee, facial or hand swelling present      Moderate to severely tight  4. REDNESS: "Does the swelling look red or infected?"     Denied redness / tenderness in calves; c/o red spot "like a bruise" under lateral ankle bone, bilaterally 5.  PAIN: "Is the swelling painful to touch?" If so, ask: "How painful is it?"   (Scale 1-10; mild, moderate or severe)     Sometimes my legs ache 6. FEVER: "Do you have a fever?" If so, ask: "What is it, how was it measured, and when did it start?"      No fever/ chills 7. CAUSE: "What do you think is causing the leg swelling?"     On feet a lot ; works 2 jobs;  8. MEDICAL HISTORY: "Do you have a history of heart failure, kidney disease, liver failure, or cancer?"     Denied kidney disease, heart disease 9. RECURRENT SYMPTOM: "Have you had leg swelling before?" If so, ask: "When was the last time?" "What happened that time?"     Stated has had this for approx. One year 10. OTHER SYMPTOMS: "Do you have any other symptoms?" (e.g., chest pain, difficulty breathing)       C/o SOB if rushed or if bending over; denied cough.; denied chest pain  Protocols used: LEG SWELLING AND EDEMA-A-AH  Message from [place] sent at 01/19/2018 11:25 AM EDT   Summary: feet/leg swelling last month   Patient's wife called and said that he has been swelling in his feet/legs for the last month off and on with weight gain. She states that his eating habits are awful. Call  him back @ [phone]

## 2018-01-24 ENCOUNTER — Ambulatory Visit: Payer: 59 | Admitting: Internal Medicine

## 2018-01-24 ENCOUNTER — Encounter: Payer: Self-pay | Admitting: Internal Medicine

## 2018-01-24 ENCOUNTER — Other Ambulatory Visit (INDEPENDENT_AMBULATORY_CARE_PROVIDER_SITE_OTHER): Payer: 59

## 2018-01-24 VITALS — BP 142/90 | HR 84 | Temp 98.2°F | Ht 69.25 in | Wt 311.0 lb

## 2018-01-24 DIAGNOSIS — M7989 Other specified soft tissue disorders: Secondary | ICD-10-CM

## 2018-01-24 DIAGNOSIS — R739 Hyperglycemia, unspecified: Secondary | ICD-10-CM | POA: Diagnosis not present

## 2018-01-24 DIAGNOSIS — I10 Essential (primary) hypertension: Secondary | ICD-10-CM | POA: Diagnosis not present

## 2018-01-24 DIAGNOSIS — E785 Hyperlipidemia, unspecified: Secondary | ICD-10-CM

## 2018-01-24 LAB — BRAIN NATRIURETIC PEPTIDE: Pro B Natriuretic peptide (BNP): 35 pg/mL (ref 0.0–100.0)

## 2018-01-24 MED ORDER — ATORVASTATIN CALCIUM 10 MG PO TABS: 10.0000 mg | ORAL_TABLET | Freq: Every day | ORAL | 3 refills | Status: DC

## 2018-01-24 MED ORDER — TRIAMTERENE-HCTZ 37.5-25 MG PO TABS: 1.0000 | ORAL_TABLET | Freq: Every day | ORAL | 3 refills | Status: DC

## 2018-01-24 NOTE — Patient Instructions (Addendum)
Your EKG was not able to be done today due to machine not working  Please take all new medication as prescribed - the fluid pill that works for Blood Pressure as well, and also the lipitor instead of the crestor   Please wear your compression stockings during daytime hours, keep legs elevated while sitting, and work on further weight loss  Please continue all other medications as before, and refills have been done if requested.  Please have the pharmacy call with any other refills you may need.  Please continue your efforts at being more active, low cholesterol diet, and weight control.  Please keep your appointments with your specialists as you may have planned  Please go to the LAB in the Basement (turn left off the elevator) for the tests to be done today  You will be contacted by phone if any changes need to be made immediately.  Otherwise, you will receive a letter about your results with an explanation, but please check with MyChart first.  Please remember to sign up for MyChart if you have not done so, as this will be important to you in the future with finding out test results, communicating by private email, and scheduling acute appointments online when needed.

## 2018-01-24 NOTE — Progress Notes (Signed)
Subjective:    Patient ID: Michael Khan, male    DOB: [birth year], 62 y.o.   MRN: [id number]  HPI Here to f/u recent message 5/24 - C/o intermittent bilat. lower leg swelling from ankle up to calves.  Reported this has been occurring intermittently, over past 4-5 mos., but has worsened over the past month.  Denied redness or tenderness of calves. Does report a "red, bruised-like area beneath lateral ankle bone, bilaterally."  Denied open sores of lower extremities.  Reported a blister-like area behind his right knee.  Stated he has shortness of breath if feels rushed, or when bending over.  Denied cough, or chest pain.  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   Wt Readings from Last 3 Encounters:  01/24/18 (!) 311 lb (141.1 kg)  11/08/17 (!) 304 lb (137.9 kg)  09/07/17 295 lb (133.8 kg)   BP Readings from Last 3 Encounters:  01/24/18 (!) 142/90  11/08/17 138/88  09/07/17 (!) 148/84  Not taking the crestor as was $200, asks for different statin No other new complaint or interval change Past Medical History:  Diagnosis Date  . Guillain Barr syndrome ([place])    When he was 57, he received a flu shot caused him to develop Guillain Barre Syndrome  . Hyperlipemia   . Measles    Past Surgical History:  Procedure Laterality Date  . ANKLE RECONSTRUCTION    . CHOLECYSTECTOMY    . NOSE SURGERY    . WRIST RECONSTRUCTION      reports that he has never smoked. He has never used smokeless tobacco. He reports that he does not drink alcohol or use drugs. family history includes Healthy in his father; Liver cancer in his mother. No Known Allergies Current Outpatient Medications on File Prior to Visit  Medication Sig Dispense Refill  . aspirin 81 MG EC tablet Take 1  tablet (81 mg total) by mouth daily. Swallow whole. 30 tablet 12   No current facility-administered medications on file prior to visit.    Review of Systems  Constitutional: Negative for other unusual diaphoresis or sweats HENT: Negative for ear discharge or swelling Eyes: Negative for other worsening visual disturbances Respiratory: Negative for stridor or other swelling  Gastrointestinal: Negative for worsening distension or other blood Genitourinary: Negative for retention or other urinary change Musculoskeletal: Negative for other MSK pain or swelling Skin: Negative for color change or other new lesions Neurological: Negative for worsening tremors and other numbness  Psychiatric/Behavioral: Negative for worsening agitation or other fatigue All other system neg per pt    Objective:   Physical Exam BP (!) 142/90   Pulse 84   Temp 98.2 F (36.8 C) (Oral)   Ht 5' 9.25" (1.759 m)   Wt (!) 311 lb (141.1 kg)   SpO2 97%   BMI 45.60 kg/m  VS noted,  Constitutional: Pt appears in NAD HENT: Head: NCAT.  Right Ear: External ear normal.  Left Ear: External ear normal.  Eyes: . Pupils are equal, round, and reactive to light. Conjunctivae and EOM are normal Nose: without d/c or deformity Neck: Neck supple. Gross normal ROM Cardiovascular: Normal rate and regular rhythm.   Pulmonary/Chest: Effort normal and breath sounds without rales or wheezing.  Abd:  Soft, NT, ND, + BS, no organomegaly Neurological: Pt is alert. At baseline orientation, motor grossly intact Skin: Skin is warm. No rashes, other new lesions, trace to 1+ bilat LE edema Psychiatric: Pt behavior is normal without agitation  No other exam findings  Lab Results  Component Value Date   WBC 8.0 09/07/2017   HGB 14.4 09/07/2017   HCT 43.0 09/07/2017   PLT 261 09/07/2017   GLUCOSE 132 (H) 09/07/2017   CHOL 213 (H) 09/07/2017   TRIG 151 (H) 09/07/2017   HDL 39 (L) 09/07/2017   LDLCALC 144 (H) 09/07/2017   ALT 22  09/07/2017   AST 22 09/07/2017   NA 141 09/07/2017   K 4.6 09/07/2017   CL 104 09/07/2017   CREATININE 1.14 09/07/2017   BUN 17 09/07/2017   CO2 22 09/07/2017   TSH 4.600 (H) 09/07/2017   PSA 0.32 02/18/2016   INR 1.0 10/03/2008   HGBA1C 5.7 11/08/2017   ECg today I have personally interpreted - unable as ECG machines inoperative at this time    Assessment & Plan:

## 2018-01-27 DIAGNOSIS — I10 Essential (primary) hypertension: Secondary | ICD-10-CM | POA: Insufficient documentation

## 2018-01-27 NOTE — Assessment & Plan Note (Signed)
Mild to mod, for BNP, compression stockings for probable venous insufficiency, leg elevation,  to f/u any worsening symptoms or concerns

## 2018-01-27 NOTE — Assessment & Plan Note (Signed)
Ok for change statin to lipitor asd,  to f/u any worsening symptoms or concerns

## 2018-01-27 NOTE — Assessment & Plan Note (Signed)
Mild elevated, o/w stable overall by history and exam, recent data reviewed with pt, and pt to start maxide qd, to f/u any worsening symptoms or concerns BP Readings from Last 3 Encounters:  01/24/18 (!) 142/90  11/08/17 138/88  09/07/17 (!) 148/84

## 2018-01-27 NOTE — Assessment & Plan Note (Signed)
stable overall by history and exam, recent data reviewed with pt, and pt to continue medical treatment as before,  to f/u any worsening symptoms or concerns Lab Results  Component Value Date   HGBA1C 5.7 11/08/2017

## 2018-05-16 ENCOUNTER — Encounter: Payer: Self-pay | Admitting: Internal Medicine

## 2018-05-16 ENCOUNTER — Ambulatory Visit: Payer: 59 | Admitting: Internal Medicine

## 2018-05-16 VITALS — BP 124/82 | HR 76 | Temp 97.9°F | Ht 69.25 in | Wt 310.0 lb

## 2018-05-16 DIAGNOSIS — R739 Hyperglycemia, unspecified: Secondary | ICD-10-CM | POA: Diagnosis not present

## 2018-05-16 DIAGNOSIS — I10 Essential (primary) hypertension: Secondary | ICD-10-CM

## 2018-05-16 DIAGNOSIS — J019 Acute sinusitis, unspecified: Secondary | ICD-10-CM | POA: Diagnosis not present

## 2018-05-16 DIAGNOSIS — Z0000 Encounter for general adult medical examination without abnormal findings: Secondary | ICD-10-CM | POA: Diagnosis not present

## 2018-05-16 DIAGNOSIS — E785 Hyperlipidemia, unspecified: Secondary | ICD-10-CM | POA: Diagnosis not present

## 2018-05-16 MED ORDER — AMOXICILLIN-POT CLAVULANATE 875-125 MG PO TABS: 1.0000 | ORAL_TABLET | Freq: Two times a day (BID) | ORAL | 0 refills | Status: AC

## 2018-05-16 MED ORDER — PREDNISONE 10 MG PO TABS: ORAL_TABLET | ORAL | 0 refills | Status: DC

## 2018-05-16 NOTE — Assessment & Plan Note (Signed)
stable overall by history and exam, recent data reviewed with pt, and pt to continue medical treatment as before,  to f/u any worsening symptoms or concerns  

## 2018-05-16 NOTE — Assessment & Plan Note (Signed)
For f/u lipids today, stable overall by history and exam, recent data reviewed with pt, and pt to continue medical treatment as before,  to f/u any worsening symptoms or concerns

## 2018-05-16 NOTE — Assessment & Plan Note (Signed)
stable overall by history and exam, recent data reviewed with pt, and pt to continue medical treatment as before,  to f/u any worsening symptoms or concerns Lab Results  Component Value Date   HGBA1C 5.7 11/08/2017  .for f/u lab

## 2018-05-16 NOTE — Patient Instructions (Signed)
Please take all new medication as prescribed  - the antibiotic, and prednisone  Please continue all other medications as before, and refills have been done if requested.  Please have the pharmacy call with any other refills you may need.  Please continue your efforts at being more active, low cholesterol diet, and weight control.  You are otherwise up to date with prevention measures today.  Please keep your appointments with your specialists as you may have planned  Please go to the LAB in the Basement (turn left off the elevator) for the tests to be done today  You will be contacted by phone if any changes need to be made immediately.  Otherwise, you will receive a letter about your results with an explanation, but please check with MyChart first.  Please remember to sign up for MyChart if you have not done so, as this will be important to you in the future with finding out test results, communicating by private email, and scheduling acute appointments online when needed.  Please return in 6 months, or sooner if needed, with Lab testing done 3-5 days before

## 2018-05-16 NOTE — Assessment & Plan Note (Signed)
Mild to mod, with acute on chronic, for antibx course, predpac asd, to f/u any worsening symptoms or concerns

## 2018-05-16 NOTE — Progress Notes (Signed)
Subjective:    Patient ID: Michael Khan, male    DOB: Aug 26, 1956, 62 y.o.   MRN: [id number]  HPI   Here with 2-3 days acute onset fever, facial pain, pressure, headache, general weakness and malaise, and greenish d/c, with mild ST and cough, but pt denies chest pain, wheezing, increased sob or doe, orthopnea, PND, increased LE swelling, palpitations, dizziness or syncope. Pt denies new neurological symptoms such as new headache, or facial or extremity weakness or numbness  Pt denies polydipsia, polyuria.  Leg swelling stable, but calf hurts to touch after the muscle cramp this am.   Aspercreme helped this am. 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 Guillain Barr syndrome ([place])    When he was 51, he received a flu shot caused him to develop Guillain Barre Syndrome  . Hyperlipemia   . Measles    Past Surgical History:  Procedure Laterality Date  . ANKLE RECONSTRUCTION    . CHOLECYSTECTOMY    . NOSE SURGERY    . WRIST RECONSTRUCTION      reports that he has never smoked. He has never used smokeless tobacco. He reports that he does not drink alcohol or use drugs. family history includes Healthy in his father; Liver cancer in his mother. No Known Allergies Current Outpatient Medications on File Prior to Visit  Medication Sig Dispense Refill  . aspirin 81 MG EC tablet Take 1 tablet (81 mg total) by mouth daily. Swallow whole. 30 tablet 12  . atorvastatin (LIPITOR) 10 MG tablet Take 1 tablet (10 mg total)  by mouth daily. 90 tablet 3  . triamterene-hydrochlorothiazide (MAXZIDE-25) 37.5-25 MG tablet Take 1 tablet by mouth daily. 90 tablet 3   No current facility-administered medications on file prior to visit.    Review of Systems  Constitutional: Negative for other unusual diaphoresis or sweats HENT: Negative for ear discharge or swelling Eyes: Negative for other worsening visual disturbances Respiratory: Negative for stridor or other swelling  Gastrointestinal: Negative for worsening distension or other blood Genitourinary: Negative for retention or other urinary change Musculoskeletal: Negative for other MSK pain or swelling Skin: Negative for color change or other new lesions Neurological: Negative for worsening tremors and other numbness  Psychiatric/Behavioral: Negative for worsening agitation or other fatigue All other system neg per pt    Objective:   Physical Exam BP 124/82   Pulse 76   Temp 97.9 F (36.6 C) (Oral)   Ht 5' 9.25" (1.759 m)   Wt (!) 310 lb (140.6 kg)   SpO2 97%   BMI 45.45 kg/m  VS noted, midl ill Constitutional: Pt appears in NAD HENT: Head: NCAT.  Right Ear: External ear normal.  Left Ear: External ear normal.  Bilat tm's with mild erythema.  Max sinus areas mild tender.  Pharynx with mild erythema, no exudate Eyes: . Pupils are equal, round, and reactive to light. Conjunctivae and EOM are normal Nose: without d/c or deformity Neck: Neck supple. Gross normal  ROM Cardiovascular: Normal rate and regular rhythm.   Pulmonary/Chest: Effort normal and breath sounds without rales or wheezing.  Abd:  Soft, NT, ND, + BS, no organomegaly Neurological: Pt is alert. At baseline orientation, motor grossly intact Skin: Skin is warm. No rashes, other new lesions, trace RLE edema Psychiatric: Pt behavior is normal without agitation  No other exam findings Lab Results  Component Value Date   WBC 8.0 09/07/2017   HGB 14.4 09/07/2017   HCT 43.0 09/07/2017   PLT  261 09/07/2017   GLUCOSE 132 (H) 09/07/2017   CHOL 213 (H) 09/07/2017   TRIG 151 (H) 09/07/2017   HDL 39 (L) 09/07/2017   LDLCALC 144 (H) 09/07/2017   ALT 22 09/07/2017   AST 22 09/07/2017   NA 141 09/07/2017   K 4.6 09/07/2017   CL 104 09/07/2017   CREATININE 1.14 09/07/2017   BUN 17 09/07/2017   CO2 22 09/07/2017   TSH 4.600 (H) 09/07/2017   PSA 0.32 02/18/2016   INR 1.0 10/03/2008   HGBA1C 5.7 11/08/2017       Assessment & Plan:

## 2018-05-29 ENCOUNTER — Emergency Department (HOSPITAL_COMMUNITY)
Admission: EM | Admit: 2018-05-29 | Discharge: 2018-05-29 | Disposition: A | Discharge status: Home / Self Care | Payer: 59 | Attending: Emergency Medicine | Admitting: Emergency Medicine

## 2018-05-29 ENCOUNTER — Encounter (HOSPITAL_COMMUNITY): Payer: Self-pay | Admitting: Emergency Medicine

## 2018-05-29 ENCOUNTER — Ambulatory Visit (HOSPITAL_BASED_OUTPATIENT_CLINIC_OR_DEPARTMENT_OTHER): Payer: 59

## 2018-05-29 ENCOUNTER — Other Ambulatory Visit: Payer: Self-pay

## 2018-05-29 DIAGNOSIS — R52 Pain, unspecified: Secondary | ICD-10-CM

## 2018-05-29 DIAGNOSIS — R252 Cramp and spasm: Secondary | ICD-10-CM

## 2018-05-29 DIAGNOSIS — M79604 Pain in right leg: Secondary | ICD-10-CM | POA: Diagnosis present

## 2018-05-29 DIAGNOSIS — M7989 Other specified soft tissue disorders: Secondary | ICD-10-CM | POA: Diagnosis not present

## 2018-05-29 DIAGNOSIS — I10 Essential (primary) hypertension: Secondary | ICD-10-CM | POA: Insufficient documentation

## 2018-05-29 DIAGNOSIS — Z79899 Other long term (current) drug therapy: Secondary | ICD-10-CM | POA: Insufficient documentation

## 2018-05-29 DIAGNOSIS — Z7982 Long term (current) use of aspirin: Secondary | ICD-10-CM | POA: Insufficient documentation

## 2018-05-29 LAB — BASIC METABOLIC PANEL
Anion gap: 11 (ref 5–15)
BUN: 18 mg/dL (ref 8–23)
CHLORIDE: 103 mmol/L (ref 98–111)
CO2: 22 mmol/L (ref 22–32)
CREATININE: 1.41 mg/dL — AB (ref 0.61–1.24)
Calcium: 9.4 mg/dL (ref 8.9–10.3)
GFR calc non Af Amer: 52 mL/min — ABNORMAL LOW (ref >60)
GFR, EST AFRICAN AMERICAN: 60 mL/min — AB (ref >60)
Glucose, Bld: 112 mg/dL — ABNORMAL HIGH (ref 70–99)
POTASSIUM: 3.8 mmol/L (ref 3.5–5.1)
SODIUM: 136 mmol/L (ref 135–145)

## 2018-05-29 LAB — CBC
HEMATOCRIT: 46.1 % (ref 39.0–52.0)
HEMOGLOBIN: 14.6 g/dL (ref 13.0–17.0)
MCH: 30.8 pg (ref 26.0–34.0)
MCHC: 31.7 g/dL (ref 30.0–36.0)
MCV: 97.3 fL (ref 78.0–100.0)
Platelets: 229 10*3/uL (ref 150–400)
RBC: 4.74 MIL/uL (ref 4.22–5.81)
RDW: 14.1 % (ref 11.5–15.5)
WBC: 10.8 10*3/uL — ABNORMAL HIGH (ref 4.0–10.5)

## 2018-05-29 MED ORDER — ACETAMINOPHEN 325 MG PO TABS
650.0000 mg | ORAL_TABLET | Freq: Once | ORAL | Status: AC
  Administered 2018-05-29: 650 mg via ORAL
  Filled 2018-05-29: qty 2

## 2018-05-29 NOTE — Progress Notes (Signed)
*  Preliminary Results* Left lower extremity venous duplex completed. Left lower extremity is negative for deep vein thrombosis. There is no evidence of left Baker's cyst.  05/29/2018 6:30 PM  Maudry Mayhew, MHA, RVT, RDCS, RDMS

## 2018-05-29 NOTE — ED Notes (Signed)
Pt going to ultrasound at this time

## 2018-05-29 NOTE — ED Notes (Signed)
E-signature not available, pt verbalized understanding of DC instructions and prescriptions 

## 2018-05-29 NOTE — ED Triage Notes (Addendum)
Pt reports pain in the back of his right leg and down his righ calf off and on x a few weeks. States that he saw his primary care doctor and he mentioned blood clots but did not order any testing. EDP at bedside for quick look at this time. Denies hx of blood clots but reports family hx.

## 2018-05-29 NOTE — Discharge Instructions (Signed)
Stop taking your atorvastatin.  Follow-up with your primary care physician for discussion of this medication as it may be causing your symptoms.  Follow-up with your PCP to also discuss work-up of your heart to rule out heart failure.  They can do an echocardiogram of your heart on an outpatient basis.  You can wear compression stockings to help with swelling.  You can also elevate the extremities when you are not walking.  Alternate ibuprofen and Tylenol as needed for pain.  Apply heat or massage and do some gentle stretching to avoid muscle stiffness.  Return to the emergency department immediately if you have any concerning signs or symptoms such as persistent shortness of breath, chest pains, worsening swelling, redness of the skin, streaking of redness up the skin, or significant persistent swelling.

## 2018-05-29 NOTE — ED Provider Notes (Signed)
Patient placed in Quick Look pathway, seen and evaluated   Chief Complaint: leg pain  HPI:  Michael Khan is a 62 y.o. male who presents to the ED with right leg pain. Pt reports pain in the back of his right leg and down his right calf off and on for over 3 weeks. States that he saw his primary care doctor last week and he mentioned blood clots but did not order any testing. Patient reports no hx of DVT but does have family hx of DVT.  ROS: M/S: leg pain  Physical Exam:  BP (!) 150/94 (BP Location: Right Arm)   Pulse 90   Temp 98.4 F (36.9 C) (Oral)   Resp 18   Ht 5\' 9"  (1.753 m)   Wt (!) 140.6 kg   SpO2 99%   BMI 45.78 kg/m    Gen: No distress  Neuro: Awake and Alert  Skin: Warm and dry  M/S: tender with palpation right posterior thigh and posterior knee and thigh.    Initiation of care has begun. The patient has been counseled on the process, plan, and necessity for staying for the completion/evaluation, and the remainder of the medical screening examination    Ashley Murrain, NP 05/29/18 [place]    Duffy Bruce, MD 05/30/18 [phone]

## 2018-05-29 NOTE — ED Provider Notes (Signed)
Michael Khan Provider Note   CSN: [id number] Arrival date & time: 05/29/18  1541     History   Chief Complaint Chief Complaint  Patient presents with  . Leg Pain    HPI Michael Khan is a 62 y.o. male with history of Guillain-Barr syndrome, HLD, HTN presents for evaluation of acute onset, intermittent right leg pain and swelling for 1 month.  He notes that over the past month or so he has felt his right thigh and calf swell.  He has had bilateral lower extremity edema for at least one year which he states is typically equal bilaterally.  He also notes muscle cramping and soreness primarily around the calf.  Notes intermittent numbness and tingling of his toes.  He will experience the sensations 2-3 times weekly.  Denies weakness, fevers, recent injury.  At times he will have left-sided low back pain additionally.  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  He has had improvement in his symptoms with application of Aspercreme, elevation, and compression stockings.  No recent travel or surgeries, no hemoptysis, no prior history of DVT or PE.  Denies orthopnea, PND, or DOE.  No chest pain.He is not on testosterone hormone placement therapy.  He was seen and evaluated by his PCP on 05/16/2018 for sinus infection and his right lower extremity symptoms.  The history is provided by the patient.    Past Medical History:  Diagnosis Date  . Guillain Barr syndrome ([place])    When he was 89, he received a flu shot caused him to develop Guillain Barre Syndrome  . Hyperlipemia   . Measles     Patient Active Problem List   Diagnosis Date Noted  . Acute sinus infection 05/16/2018  . Hypertension 01/27/2018  . Leg swelling 01/24/2018  . Hyperglycemia 11/08/2017  . Corns/callosities 11/08/2017  . HLD (hyperlipidemia)   . Muscle cramps 01/30/2017  . Hx of Guillain-Barre syndrome 06/15/2016  .  Encounter for routine adult medical exam with abnormal findings 02/17/2016  . Rash and nonspecific skin eruption 02/17/2016  . Allergic rhinitis 04/10/2015    Past Surgical History:  Procedure Laterality Date  . ANKLE RECONSTRUCTION    . CHOLECYSTECTOMY    . NOSE SURGERY    . WRIST RECONSTRUCTION          Home Medications    Prior to Admission medications   Medication Sig Start Date End Date Taking? Authorizing Provider  aspirin 81 MG EC tablet Take 1 tablet (81 mg total) by mouth daily. Swallow whole. 11/08/17   Biagio Borg, MD  atorvastatin (LIPITOR) 10 MG tablet Take 1 tablet (10 mg total) by mouth daily. 01/24/18 01/24/19  Biagio Borg, MD  predniSONE (DELTASONE) 10 MG tablet 2 tabs by mouth per day for 5 days 05/16/18   Biagio Borg, MD  triamterene-hydrochlorothiazide (MAXZIDE-25) 37.5-25 MG tablet Take 1 tablet by mouth daily. 01/24/18   Biagio Borg, MD    Family History Family History  Problem Relation Age of Onset  . Liver cancer Mother   . Healthy Father   . Colon cancer Neg Hx     Social History Social History   Tobacco Use  . Smoking status: Never Smoker  . Smokeless tobacco: Never Used  Substance Use Topics  . Alcohol use: No  . Drug use: No     Allergies   Patient has no known allergies.  Review of Systems Review of Systems  Constitutional: Negative for chills and fever.  Respiratory: Negative for shortness of breath.   Cardiovascular: Negative for chest pain.  Gastrointestinal: Negative for abdominal pain, nausea and vomiting.  Musculoskeletal: Negative for back pain.  Neurological: Negative for weakness and numbness.  All other systems reviewed and are negative.    Physical Exam Updated Vital Signs BP (!) 148/93   Pulse 85   Temp 98.4 F (36.9 C) (Oral)   Resp 17   Ht 5\' 9"  (1.753 m)   Wt (!) 140.6 kg   SpO2 98%   BMI 45.78 kg/m   Physical Exam  Constitutional: He appears well-developed and well-nourished. No distress.    HENT:  Head: Normocephalic and atraumatic.  Eyes: Conjunctivae are normal. Right eye exhibits no discharge. Left eye exhibits no discharge.  Neck: No JVD present. No tracheal deviation present.  Cardiovascular: Normal rate and intact distal pulses.  2+ DP/PT pulses bilaterally.  A trace pitting edema of the bilateral lower extremities, equal bilaterally.  Calves and ankles measure equally circumferentially.  There is tenderness to palpation of the right calf posteriorly.  No erythema, palpable cords, induration.  Compartments are soft.  Pulmonary/Chest: Effort normal.  Abdominal: He exhibits no distension.  Musculoskeletal: He exhibits no edema.  No midline lumbar spine tenderness.  Right SI joint and right paralumbar muscle tenderness noted.  No deformity, crepitus, or step-off noted.  5/5 strength of BUE and BLE major muscle groups.  Neurological: He is alert.  Fluent speech, no facial droop, sensation intact to soft touch of bilateral lower extremities.  Ambulates with good gait and balance.  Able to Heel Walk and Toe Walk without difficulty.  Skin: Skin is warm and dry. No erythema.  Psychiatric: He has a normal mood and affect. His behavior is normal.  Nursing note and vitals reviewed.    ED Treatments / Results  Labs (all labs ordered are listed, but only abnormal results are displayed) Labs Reviewed  CBC - Abnormal; Notable for the following components:      Result Value   WBC 10.8 (*)    All other components within normal limits  BASIC METABOLIC PANEL - Abnormal; Notable for the following components:   Glucose, Bld 112 (*)    Creatinine, Ser 1.41 (*)    GFR calc non Af Amer 52 (*)    GFR calc Af Amer 60 (*)    All other components within normal limits    EKG None  Radiology No results found.  Procedures Procedures (including critical care time)  Medications Ordered in ED Medications  acetaminophen (TYLENOL) tablet 650 mg (650 mg Oral Given 05/29/18 1849)      Initial Impression / Assessment and Plan / ED Course  I have reviewed the triage vital signs and the nursing notes.  Pertinent labs & imaging results that were available during my care of the patient were reviewed by me and considered in my medical decision making (see chart for details).     Patient with intermittent right lower extremity swelling and muscle cramps for 1 month.  He is afebrile, mildly hypertensive but vital signs otherwise stable.  He is nontoxic in appearance.  He is neurovascularly intact and ambulatory without difficulty.  No midline spine tenderness or constitutional symptoms concerning for cauda equina or spinal abscess.  Lab work reviewed by me shows mild nonspecific leukocytosis and mildly elevated creatinine but otherwise overall unremarkable.  Does not suggest AKI.  DVT study negative for  acute DVT.  No dyspnea on exertion, orthopnea, or PND to suggest heart failure.  He has bilateral lower extremity edema equally today which he states has been ongoing for over 1 year.  He does have good follow-up with his PCP.  Differential includes meralgia paresthetica versus myalgias from his statin versus osteoarthritis.  Discussed discontinuing his statin and follow-up with PCP for outpatient echocardiogram and further medication management.  Does not appear to be in fulminant heart failure today.  SPO2 saturations stable.  Conservative therapy indicated and discussed with patient including compression stockings, elevation, heat therapy, and Tylenol.  He will follow-up with PCP on an outpatient basis.  Discussed strict ED return precautions.  Patient, patient's daughter, and patient's wife verbalized understanding of and agreement with plan and patient is stable for discharge home at this time.  Final Clinical Impressions(s) / ED Diagnoses   Final diagnoses:  Right leg pain  Muscle cramps    ED Discharge Orders    None       Debroah Baller 05/29/18 Sherron Flemings, MD 05/30/18 0001

## 2018-06-06 ENCOUNTER — Ambulatory Visit: Payer: 59 | Admitting: Internal Medicine

## 2018-06-06 ENCOUNTER — Encounter: Payer: Self-pay | Admitting: Internal Medicine

## 2018-06-06 VITALS — BP 142/90 | HR 79 | Temp 97.7°F | Ht 69.0 in | Wt 315.0 lb

## 2018-06-06 DIAGNOSIS — I10 Essential (primary) hypertension: Secondary | ICD-10-CM

## 2018-06-06 DIAGNOSIS — M7989 Other specified soft tissue disorders: Secondary | ICD-10-CM | POA: Diagnosis not present

## 2018-06-06 DIAGNOSIS — E785 Hyperlipidemia, unspecified: Secondary | ICD-10-CM | POA: Diagnosis not present

## 2018-06-06 DIAGNOSIS — R739 Hyperglycemia, unspecified: Secondary | ICD-10-CM | POA: Diagnosis not present

## 2018-06-06 MED ORDER — TRIAMTERENE-HCTZ 75-50 MG PO TABS: 1.0000 | ORAL_TABLET | Freq: Every day | ORAL | 3 refills | Status: DC

## 2018-06-06 MED ORDER — ROSUVASTATIN CALCIUM 10 MG PO TABS: 10.0000 mg | ORAL_TABLET | Freq: Every day | ORAL | 3 refills | Status: DC

## 2018-06-06 NOTE — Assessment & Plan Note (Signed)
Suspect this is related to venous insufficiency, for leg elevation, wt loss, compression stockings and increased maxide 75/25 to help with BP as well

## 2018-06-06 NOTE — Patient Instructions (Addendum)
Ok to stop the lipitor  Please take all new medication as prescribed - the crestor 10 mg  Ok to change the fluid pill to the Maxide 75-25 mg - 1 per day  Please continue all other medications as before, and refills have been done if requested.  Please have the pharmacy call with any other refills you may need.  Please continue your efforts at being more active, low cholesterol diet, and weight control.  Please keep your appointments with your specialists as you may have planned

## 2018-06-06 NOTE — Assessment & Plan Note (Signed)
[place]  for change lipitor to crestor asd, lower chol diet

## 2018-06-06 NOTE — Progress Notes (Signed)
Subjective:    Patient ID: Michael Khan, male    DOB: 1955-09-21, 62 y.o.   MRN: [id number]  HPI   Here to f/u; overall doing ok,  Pt denies chest pain, increasing sob or doe, wheezing, orthopnea, PND, palpitations, dizziness or syncope, though has persistent recent mild worsening RLE edema, none on the left.  Recent BNP normal.  Pt denies new neurological symptoms such as new headache, or facial or extremity weakness or numbness.  Pt denies polydipsia, polyuria, or low sugar episode.  Pt states overall good compliance with meds, mostly trying to follow appropriate diet, with wt overall stable,  but little exercise however.  Did not take BP meds this am.  Past Medical History:  Diagnosis Date  . Guillain Barr syndrome ([place])    When he was 60, he received a flu shot caused him to develop Guillain Barre Syndrome  . Hyperlipemia   . Measles    Past Surgical History:  Procedure Laterality Date  . ANKLE RECONSTRUCTION    . CHOLECYSTECTOMY    . NOSE SURGERY    . WRIST RECONSTRUCTION      reports that he has never smoked. He has never used smokeless tobacco. He reports that he does not drink alcohol or use drugs. family history includes Healthy in his father; Liver cancer in his mother. No Known Allergies Current Outpatient Medications on File Prior to Visit  Medication Sig Dispense Refill  . aspirin 81 MG EC tablet Take 1 tablet (81 mg total) by mouth daily. Swallow whole. 30 tablet 12   No current facility-administered medications on file prior to visit.    Review of Systems  Constitutional: Negative for other unusual diaphoresis or sweats HENT: Negative for ear discharge or swelling Eyes: Negative for other worsening visual disturbances Respiratory: Negative for stridor or other swelling  Gastrointestinal: Negative for worsening distension or other blood Genitourinary: Negative for retention or other urinary change Musculoskeletal: Negative for other MSK pain or swelling Skin:  Negative for color change or other new lesions Neurological: Negative for worsening tremors and other numbness  Psychiatric/Behavioral: Negative for worsening agitation or other fatigue All other system neg per pt    Objective:   Physical Exam BP (!) 142/90   Pulse 79   Temp 97.7 F (36.5 C) (Oral)   Ht 5\' 9"  (1.753 m)   Wt (!) 315 lb (142.9 kg)   SpO2 97%   BMI 46.52 kg/m  VS noted, not ill appaering Constitutional: Pt appears in NAD HENT: Head: NCAT.  Right Ear: External ear normal.  Left Ear: External ear normal.  Eyes: . Pupils are equal, round, and reactive to light. Conjunctivae and EOM are normal Nose: without d/c or deformity Neck: Neck supple. Gross normal ROM Cardiovascular: Normal rate and regular rhythm.   Pulmonary/Chest: Effort normal and breath sounds without rales or wheezing.  Neurological: Pt is alert. At baseline orientation, motor grossly intact Skin: Skin is warm. No rashes, other new lesions, trace to 1+ RLE edema Psychiatric: Pt behavior is normal without agitation  No other exam findings Lab Results  Component Value Date   WBC 10.8 (H) 05/29/2018   HGB 14.6 05/29/2018   HCT 46.1 05/29/2018   PLT 229 05/29/2018   GLUCOSE 112 (H) 05/29/2018   CHOL 213 (H) 09/07/2017   TRIG 151 (H) 09/07/2017   HDL 39 (L) 09/07/2017   LDLCALC 144 (H) 09/07/2017   ALT 22 09/07/2017   AST 22 09/07/2017   NA 136 05/29/2018  K 3.8 05/29/2018   CL 103 05/29/2018   CREATININE 1.41 (H) 05/29/2018   BUN 18 05/29/2018   CO2 22 05/29/2018   TSH 4.600 (H) 09/07/2017   PSA 0.32 02/18/2016   INR 1.0 10/03/2008   HGBA1C 5.7 11/08/2017          Assessment & Plan:

## 2018-06-06 NOTE — Assessment & Plan Note (Signed)
stable overall by history and exam, recent data reviewed with pt, and pt to continue medical treatment as before,  to f/u any worsening symptoms or concerns  

## 2018-06-06 NOTE — Assessment & Plan Note (Signed)
O/w stable overall by history and exam, recent data reviewed with pt, and pt to continue medical treatment as before,  to f/u any worsening symptoms or concerns 

## 2018-11-14 ENCOUNTER — Ambulatory Visit: Payer: 59 | Admitting: Internal Medicine

## 2019-01-14 ENCOUNTER — Ambulatory Visit (INDEPENDENT_AMBULATORY_CARE_PROVIDER_SITE_OTHER): Payer: 59 | Admitting: Internal Medicine

## 2019-01-14 ENCOUNTER — Encounter: Payer: Self-pay | Admitting: Internal Medicine

## 2019-01-14 DIAGNOSIS — I10 Essential (primary) hypertension: Secondary | ICD-10-CM

## 2019-01-14 DIAGNOSIS — M545 Low back pain, unspecified: Secondary | ICD-10-CM

## 2019-01-14 DIAGNOSIS — Z0000 Encounter for general adult medical examination without abnormal findings: Secondary | ICD-10-CM | POA: Diagnosis not present

## 2019-01-14 DIAGNOSIS — E785 Hyperlipidemia, unspecified: Secondary | ICD-10-CM

## 2019-01-14 DIAGNOSIS — Z0001 Encounter for general adult medical examination with abnormal findings: Secondary | ICD-10-CM

## 2019-01-14 DIAGNOSIS — R739 Hyperglycemia, unspecified: Secondary | ICD-10-CM

## 2019-01-14 MED ORDER — TRAMADOL HCL 50 MG PO TABS: 50.0000 mg | ORAL_TABLET | Freq: Four times a day (QID) | ORAL | 0 refills | Status: DC | PRN

## 2019-01-14 MED ORDER — CYCLOBENZAPRINE HCL 5 MG PO TABS: 5.0000 mg | ORAL_TABLET | Freq: Three times a day (TID) | ORAL | 0 refills | Status: DC | PRN

## 2019-01-14 NOTE — Patient Instructions (Signed)
Please take all new medication as prescribed - the muscle relaxer, and pain medication  Remember to call for your yearly eye doctor appt  Please continue all other medications as before, and refills have been done if requested.  Please have the pharmacy call with any other refills you may need.  Please continue your efforts at being more active, low cholesterol diet, and weight control.  You are otherwise up to date with prevention measures today.  Please keep your appointments with your specialists as you may have planned  Please go to the XRAY Department in the Basement (go straight as you get off the elevator) for the x-ray testing  Please go to the LAB in the Basement (turn left off the elevator) for the tests to be done   You will be contacted by phone if any changes need to be made immediately.  Otherwise, you will receive a letter about your results with an explanation, but please check with MyChart first.  Please remember to sign up for MyChart if you have not done so, as this will be important to you in the future with finding out test results, communicating by private email, and scheduling acute appointments online when needed.  Please return in 6 months, or sooner if needed, with Lab testing done 3-5 days before

## 2019-01-14 NOTE — Assessment & Plan Note (Signed)
Encouraged to check BP at home and next visit, goal < 140/90

## 2019-01-14 NOTE — Assessment & Plan Note (Signed)
stable overall by history and exam, recent data reviewed with pt, and pt to continue medical treatment as before,  to f/u any worsening symptoms or concerns  

## 2019-01-14 NOTE — Assessment & Plan Note (Addendum)
S/p fairly recent fall, with recurrent pain, for ls spine film, also muscle relaxer and tramadol prn pain,  to f/u any worsening symptoms or concerns  In addition to the time spent performing CPE, I spent an additional 25 minutes face to face,in which greater than 50% of this time was spent in counseling and coordination of care for patient's acute illness as documented, including the differential dx, treatment, further evaluation and other management of low back pain, HTn, HLD, hyperglycemia

## 2019-01-14 NOTE — Progress Notes (Signed)
Patient ID: Michael Khan, male   DOB: 05-28-1956, 63 y.o.   MRN: [id number]  Virtual Visit via Video Note  I connected with [name] on 01/14/19 at  1:20 PM EDT by a video enabled telemedicine application and verified that I am speaking with the correct person using two identifiers.  Location: Patient: at home with grandchild quite vocal constant Provider: at office   I discussed the limitations of evaluation and management by telemedicine and the availability of in person appointments. The patient expressed understanding and agreed to proceed.  History of Present Illness: Here for wellness and f/u;  Overall doing ok;  Pt denies Chest pain, worsening SOB, DOE, wheezing, orthopnea, PND, worsening LE edema, palpitations, dizziness or syncope.  Pt denies neurological change such as new headache, facial or extremity weakness.  Pt denies polydipsia, polyuria, or low sugar symptoms. Pt states overall good compliance with treatment and medications, good tolerability, and has been trying to follow appropriate diet.  Pt denies worsening depressive symptoms, suicidal ideation or panic. No fever, night sweats, wt loss, loss of appetite, or other constitutional symptoms.  Pt states good ability with ADL's, has low fall risk, home safety reviewed and adequate, no other significant changes in hearing or vision, and only occasionally active with exercise. Also lost balance on the end of a truck and fell about 3 ft to buttocks mostly left side, felt quite sore x 2 wks, but resolved.  3 days ago unfortauntely was working on the plumbing in the house, now with pain to left lower back with some radiation to the left buttock, but not further.  Asks for xray.  Advil and goody powders help somewhat, but overall worse with more wlaking, and still working doing security work, fortuantely with less walking recently, more desk work.  Wants to stay healthy as he has to be responsible for his wife on dialysis, and 6 mo  grandchild who cant go to daycare due to pandemic.  Overall pain 5/10, sharp, intermittent,  Past Medical History:  Diagnosis Date  . Guillain Barr syndrome ([place])    When he was 64, he received a flu shot caused him to develop Guillain Barre Syndrome  . Hyperlipemia   . Measles    Past Surgical History:  Procedure Laterality Date  . ANKLE RECONSTRUCTION    . CHOLECYSTECTOMY    . NOSE SURGERY    . WRIST RECONSTRUCTION      reports that he has never smoked. He has never used smokeless tobacco. He reports that he does not drink alcohol or use drugs. family history includes Healthy in his father; Liver cancer in his mother. Allergies  Allergen Reactions  . Lipitor [Atorvastatin Calcium]     discomfort   Current Outpatient Medications on File Prior to Visit  Medication Sig Dispense Refill  . aspirin 81 MG EC tablet Take 1 tablet (81 mg total) by mouth daily. Swallow whole. 30 tablet 12  . rosuvastatin (CRESTOR) 10 MG tablet Take 1 tablet (10 mg total) by mouth daily. 90 tablet 3  . triamterene-hydrochlorothiazide (MAXZIDE) 75-50 MG tablet Take 1 tablet by mouth daily. 90 tablet 3   No current facility-administered medications on file prior to visit.     Observations/Objective: Alert, NAD, appropriate mood and affect, resps normal, cn 2-12 intact, moves all 4s, no visible rash or swelling Lab Results  Component Value Date   WBC 10.8 (H) 05/29/2018   HGB 14.6 05/29/2018   HCT 46.1 05/29/2018   PLT 229  05/29/2018   GLUCOSE 112 (H) 05/29/2018   CHOL 213 (H) 09/07/2017   TRIG 151 (H) 09/07/2017   HDL 39 (L) 09/07/2017   LDLCALC 144 (H) 09/07/2017   ALT 22 09/07/2017   AST 22 09/07/2017   NA 136 05/29/2018   K 3.8 05/29/2018   CL 103 05/29/2018   CREATININE 1.41 (H) 05/29/2018   BUN 18 05/29/2018   CO2 22 05/29/2018   TSH 4.600 (H) 09/07/2017   PSA 0.32 02/18/2016   INR 1.0 10/03/2008   HGBA1C 5.7 11/08/2017   Assessment and Plan: See notes  Follow Up  Instructions: See notes   I discussed the assessment and treatment plan with the patient. The patient was provided an opportunity to ask questions and all were answered. The patient agreed with the plan and demonstrated an understanding of the instructions.   The patient was advised to call back or seek an in-person evaluation if the symptoms worsen or if the condition fails to improve as anticipated.   Cathlean Cower, MD

## 2019-01-14 NOTE — Assessment & Plan Note (Signed)

## 2019-01-14 NOTE — Assessment & Plan Note (Signed)
stable overall by history and exam, recent data reviewed with pt, and pt to continue medical treatment as before,  to f/u any worsening symptoms or concerns, for lipids with labs 

## 2019-01-16 ENCOUNTER — Other Ambulatory Visit (INDEPENDENT_AMBULATORY_CARE_PROVIDER_SITE_OTHER): Payer: 59

## 2019-01-16 ENCOUNTER — Telehealth: Payer: Self-pay

## 2019-01-16 ENCOUNTER — Ambulatory Visit (INDEPENDENT_AMBULATORY_CARE_PROVIDER_SITE_OTHER)
Admission: RE | Admit: 2019-01-16 | Discharge: 2019-01-16 | Disposition: A | Discharge status: Home / Self Care | Payer: 59 | Source: Ambulatory Visit | Attending: Internal Medicine | Admitting: Internal Medicine

## 2019-01-16 ENCOUNTER — Encounter: Payer: Self-pay | Admitting: Internal Medicine

## 2019-01-16 ENCOUNTER — Other Ambulatory Visit: Payer: Self-pay | Admitting: Internal Medicine

## 2019-01-16 ENCOUNTER — Other Ambulatory Visit: Payer: Self-pay

## 2019-01-16 DIAGNOSIS — R739 Hyperglycemia, unspecified: Secondary | ICD-10-CM | POA: Diagnosis not present

## 2019-01-16 DIAGNOSIS — M545 Low back pain, unspecified: Secondary | ICD-10-CM

## 2019-01-16 DIAGNOSIS — Z0001 Encounter for general adult medical examination with abnormal findings: Secondary | ICD-10-CM

## 2019-01-16 LAB — HEPATIC FUNCTION PANEL
ALT: 19 U/L (ref 0–53)
AST: 19 U/L (ref 0–37)
Albumin: 3.9 g/dL (ref 3.5–5.2)
Alkaline Phosphatase: 62 U/L (ref 39–117)
Bilirubin, Direct: 0.1 mg/dL (ref 0.0–0.3)
Total Bilirubin: 0.6 mg/dL (ref 0.2–1.2)
Total Protein: 7 g/dL (ref 6.0–8.3)

## 2019-01-16 LAB — URINALYSIS, ROUTINE W REFLEX MICROSCOPIC
Bilirubin Urine: NEGATIVE
Hgb urine dipstick: NEGATIVE
Ketones, ur: NEGATIVE
Leukocytes,Ua: NEGATIVE
Nitrite: NEGATIVE
RBC / HPF: NONE SEEN (ref 0–?)
Specific Gravity, Urine: >=1.03 — AB (ref 1.000–1.030)
Urine Glucose: NEGATIVE
Urobilinogen, UA: 0.2 (ref 0.0–1.0)
pH: 5.5 (ref 5.0–8.0)

## 2019-01-16 LAB — BASIC METABOLIC PANEL
BUN: 25 mg/dL — ABNORMAL HIGH (ref 6–23)
CO2: 27 mEq/L (ref 19–32)
Calcium: 9.3 mg/dL (ref 8.4–10.5)
Chloride: 101 mEq/L (ref 96–112)
Creatinine, Ser: 1.22 mg/dL (ref 0.40–1.50)
GFR: 72.44 mL/min (ref >60.00)
Glucose, Bld: 98 mg/dL (ref 70–99)
Potassium: 4 mEq/L (ref 3.5–5.1)
Sodium: 137 mEq/L (ref 135–145)

## 2019-01-16 LAB — HEMOGLOBIN A1C: Hgb A1c MFr Bld: 6.4 % (ref 4.6–6.5)

## 2019-01-16 LAB — CBC WITH DIFFERENTIAL/PLATELET
Basophils Absolute: 0.1 10*3/uL (ref 0.0–0.1)
Basophils Relative: 0.9 % (ref 0.0–3.0)
Eosinophils Absolute: 0.5 10*3/uL (ref 0.0–0.7)
Eosinophils Relative: 5.4 % — ABNORMAL HIGH (ref 0.0–5.0)
HCT: 41.1 % (ref 39.0–52.0)
Hemoglobin: 14.2 g/dL (ref 13.0–17.0)
Lymphocytes Relative: 28.2 % (ref 12.0–46.0)
Lymphs Abs: 2.4 10*3/uL (ref 0.7–4.0)
MCHC: 34.5 g/dL (ref 30.0–36.0)
MCV: 90.5 fl (ref 78.0–100.0)
Monocytes Absolute: 0.9 10*3/uL (ref 0.1–1.0)
Monocytes Relative: 10.6 % (ref 3.0–12.0)
Neutro Abs: 4.7 10*3/uL (ref 1.4–7.7)
Neutrophils Relative %: 54.9 % (ref 43.0–77.0)
Platelets: 275 10*3/uL (ref 150.0–400.0)
RBC: 4.54 Mil/uL (ref 4.22–5.81)
RDW: 13.9 % (ref 11.5–15.5)
WBC: 8.6 10*3/uL (ref 4.0–10.5)

## 2019-01-16 LAB — LIPID PANEL
Cholesterol: 132 mg/dL (ref 0–200)
HDL: 41.7 mg/dL (ref >39.00)
LDL Cholesterol: 55 mg/dL (ref 0–99)
NonHDL: 90.44
Total CHOL/HDL Ratio: 3
Triglycerides: 179 mg/dL — ABNORMAL HIGH (ref 0.0–149.0)
VLDL: 35.8 mg/dL (ref 0.0–40.0)

## 2019-01-16 LAB — TSH: TSH: 5.97 u[IU]/mL — ABNORMAL HIGH (ref 0.35–4.50)

## 2019-01-16 LAB — PSA: PSA: 0.37 ng/mL (ref 0.10–4.00)

## 2019-01-16 IMAGING — DX LUMBAR SPINE - COMPLETE 4+ VIEW
5 series · 5 of 5 positions shown · non-contrast
Comparison: None.

CLINICAL DATA: Low back pain following fall, initial encounter

EXAM:
LUMBAR SPINE - COMPLETE 4+ VIEW

[l-spine ap]
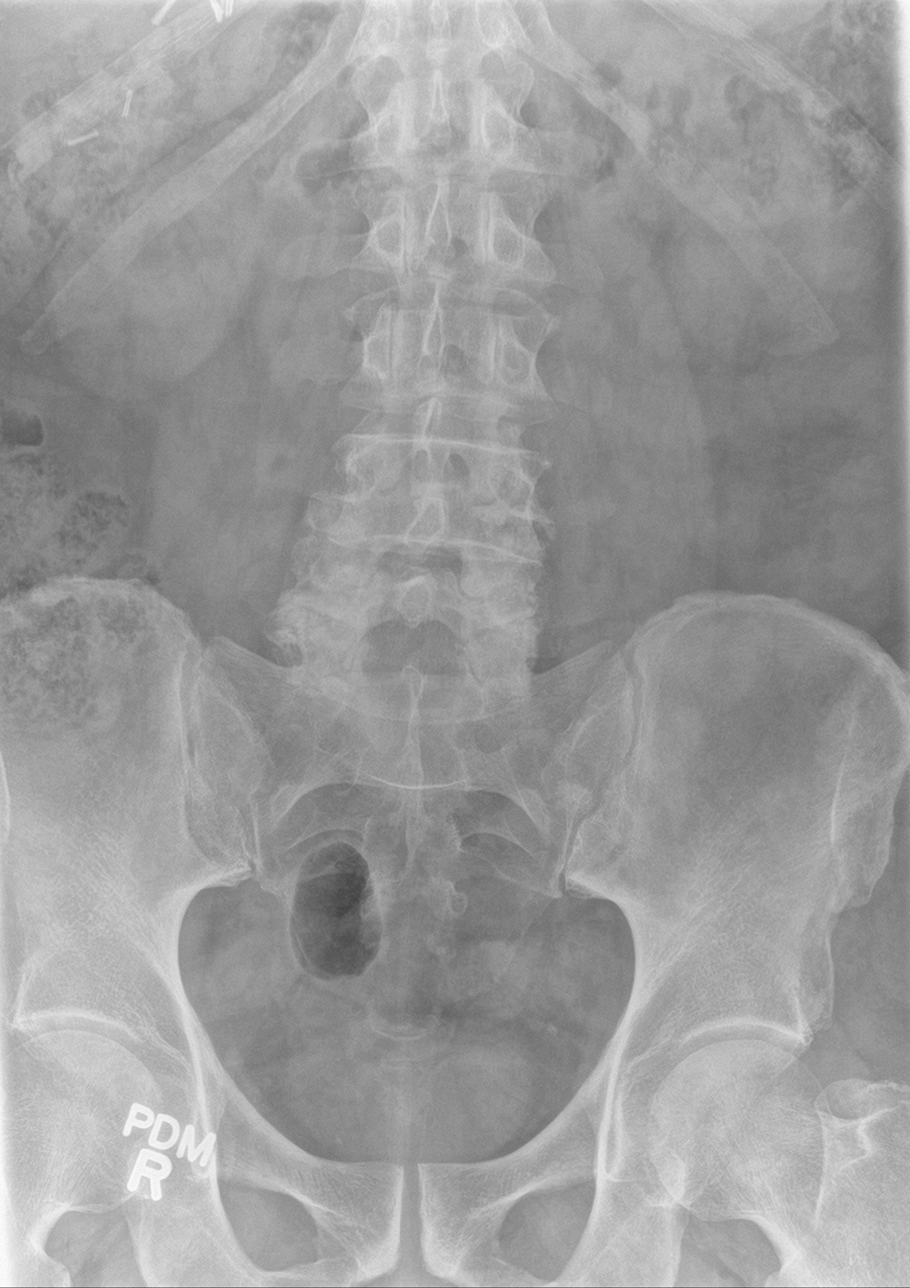

[l-spine obl (1 of 2)]
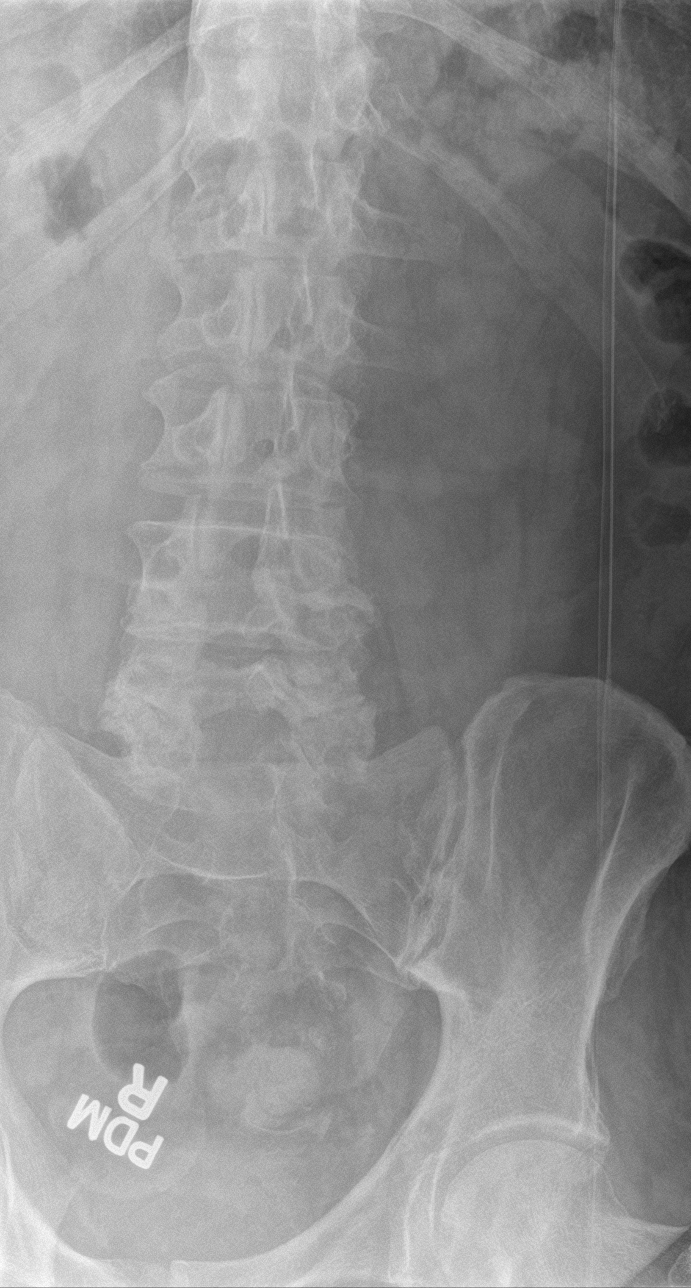

[l-spine obl (2 of 2)]
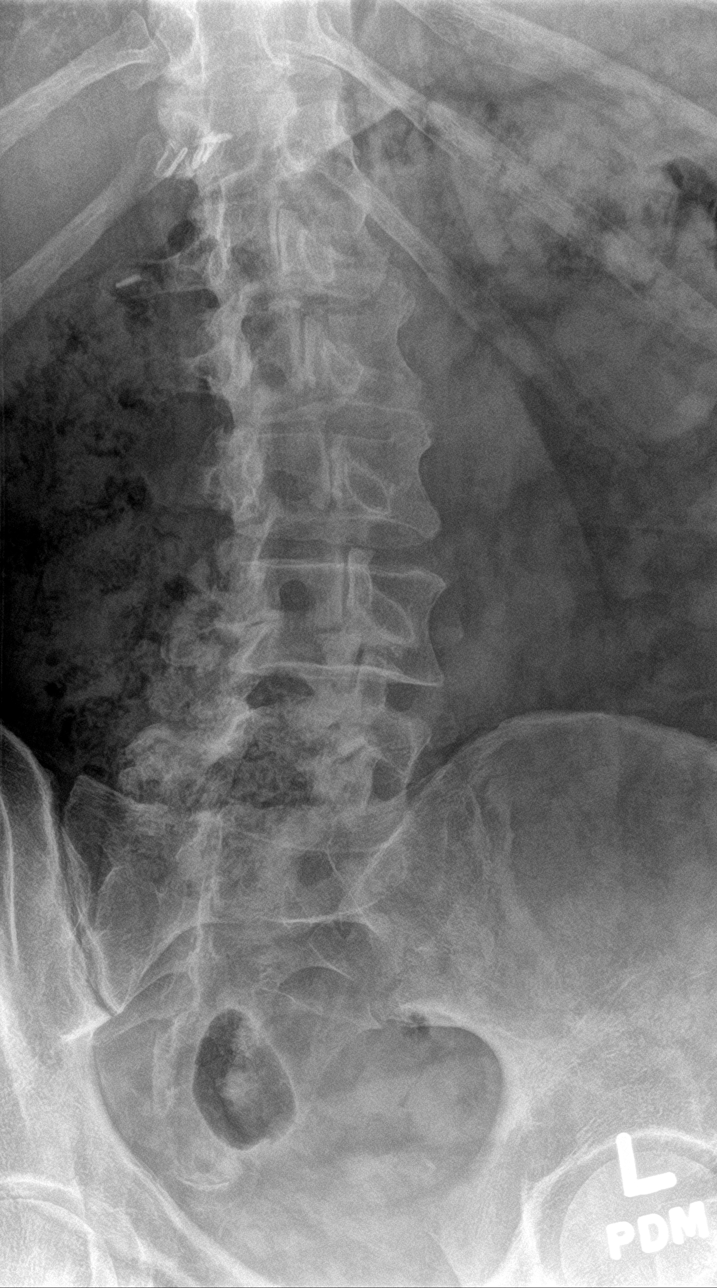

[l-spine lat]
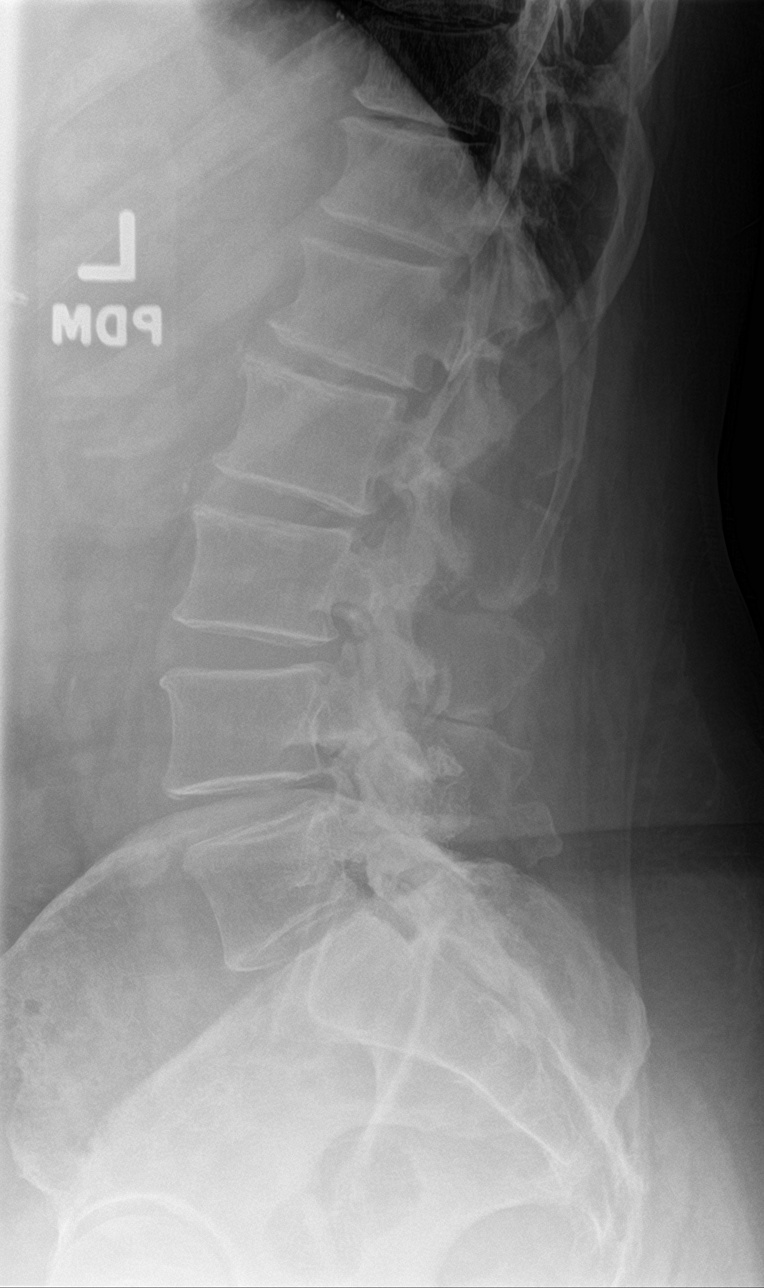

[l-spine spot]
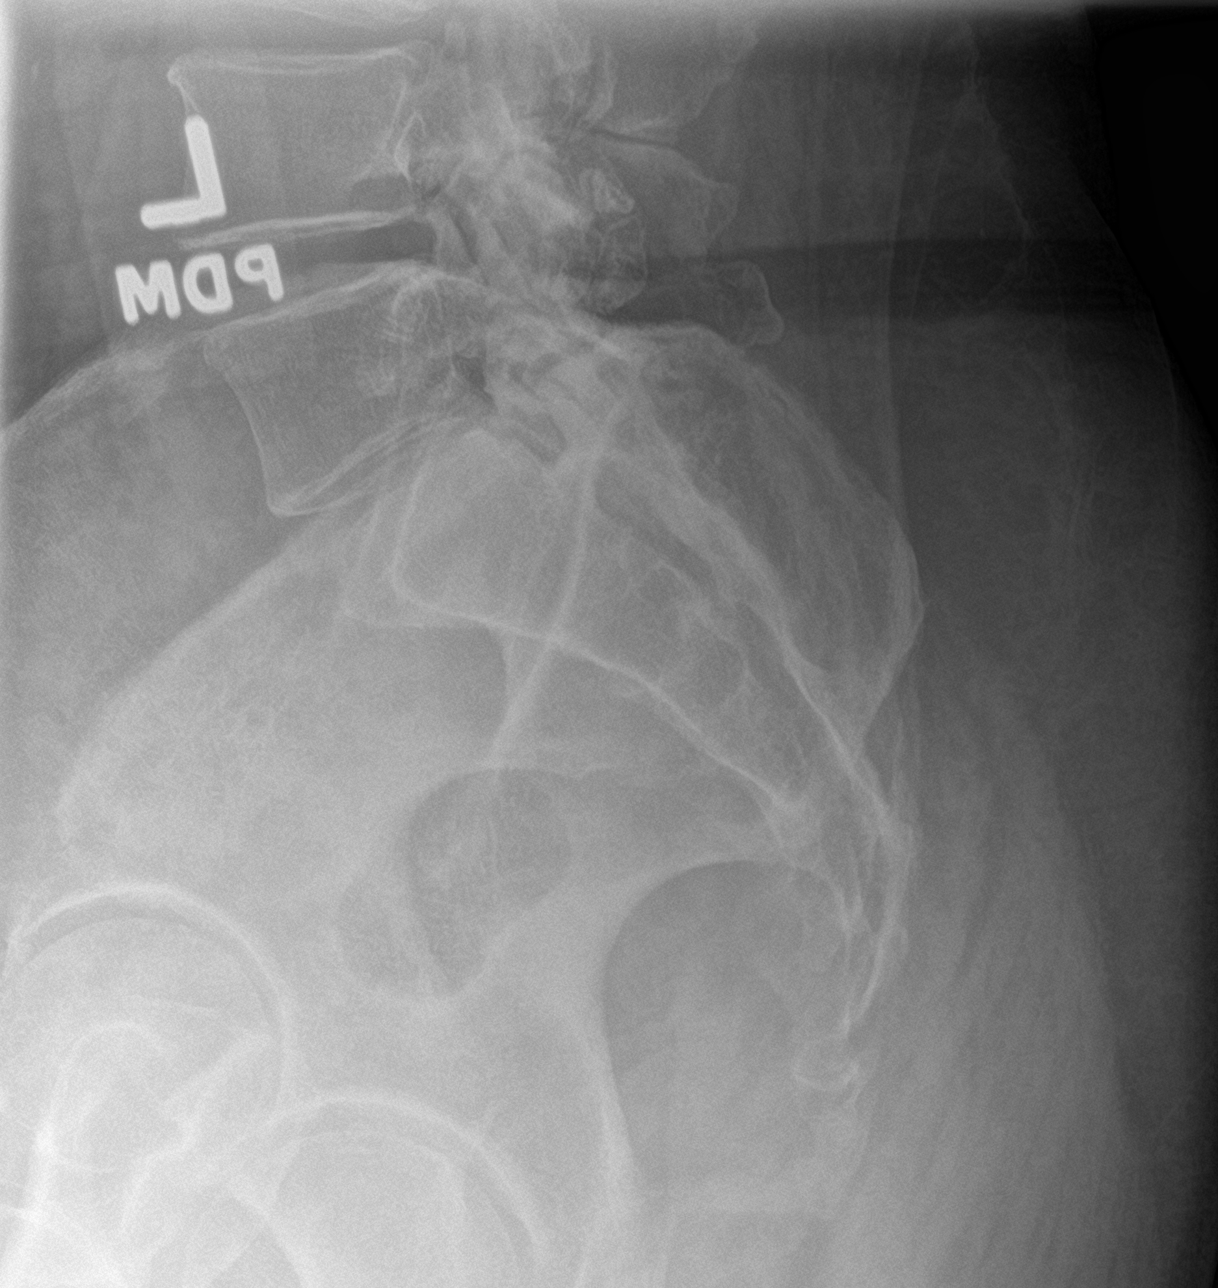

[5 of 5 positions shown; findings below may reference images not displayed]

FINDINGS: Five lumbar type vertebral bodies are well visualized. Vertebral
body height is well maintained. No anterolisthesis is noted.
Multilevel facet hypertrophic changes are seen. There are changes
suggestive of L5 spinous process fracture.
IMPRESSION: Multilevel degenerative change. There are changes suggestive of
spinous process fracture at L5. MRI or CT may be helpful for further
evaluation.

## 2019-01-16 MED ORDER — LEVOTHYROXINE SODIUM 25 MCG PO TABS: 25.0000 ug | ORAL_TABLET | Freq: Every day | ORAL | 3 refills | Status: DC

## 2019-01-16 NOTE — Telephone Encounter (Signed)
-----   Message from Biagio Borg, MD sent at 01/16/2019  1:11 PM EDT ----- Letter sent, cont same tx except  The test results show that your current treatment is OK, except the Thyroid testing indicates a new very mild low thyroid condition.  We should start a new low dose medication called levothyroxine which is very easy to take, and generic.  I will send the prescription, and you should hear from the office as well.,.    Shirron to please inform pt, I will do rx

## 2019-01-16 NOTE — Telephone Encounter (Signed)
Pt has been informed of results and expressed understanding.  °

## 2019-06-17 ENCOUNTER — Other Ambulatory Visit: Payer: Self-pay | Admitting: Internal Medicine

## 2019-06-17 MED ORDER — TRAMADOL HCL 50 MG PO TABS: 50.0000 mg | ORAL_TABLET | Freq: Four times a day (QID) | ORAL | 0 refills | Status: DC | PRN

## 2019-06-17 MED ORDER — CYCLOBENZAPRINE HCL 5 MG PO TABS: 5.0000 mg | ORAL_TABLET | Freq: Three times a day (TID) | ORAL | 1 refills | Status: DC | PRN

## 2019-06-17 MED ORDER — LEVOTHYROXINE SODIUM 25 MCG PO TABS: 25.0000 ug | ORAL_TABLET | Freq: Every day | ORAL | 1 refills | Status: DC

## 2019-06-17 NOTE — Telephone Encounter (Signed)
Copied from [place] [id number]. Topic: Quick Communication - Rx Refill/Question >> Jun 17, 2019  2:51 PM Izola Price, [place] A wrote: Medication: cyclobenzaprine (FLEXERIL) 5 MG tablet,levothyroxine (SYNTHROID) 25 MCG tablet,rosuvastatin (CRESTOR) 10 MG tablet ,traMADol (ULTRAM) 50 MG tablet   Has the patient contacted their pharmacy? Yes (Agent: If no, request that the patient contact the pharmacy for the refill.) (Agent: If yes, when and what did the pharmacy advise?)Contact PCP  Preferred Pharmacy (with phone number or street name): [facility] ([address] [place] [id number] (Phone) [phone] (Fax)    Agent: Please be advised that RX refills may take up to 3 business days. We ask that you follow-up with your pharmacy.

## 2019-06-17 NOTE — Telephone Encounter (Signed)
Done erx 

## 2019-06-17 NOTE — Telephone Encounter (Signed)
Routing to CMA 

## 2019-06-17 NOTE — Telephone Encounter (Signed)
Requested medication (s) are due for refill today: yes  Requested medication (s) are on the active medication list: yes  Last refill: 01/14/2019  Future visit scheduled: yes  Notes to clinic:  Refill cannot be delegated   Requested Prescriptions  Pending Prescriptions Disp Refills   traMADol (ULTRAM) 50 MG tablet 30 tablet 0    Sig: Take 1 tablet (50 mg total) by mouth every 6 (six) hours as needed.     Not Delegated - Analgesics:  Opioid Agonists Failed - 06/17/2019  3:03 PM      Failed - This refill cannot be delegated      Failed - Urine Drug Screen completed in last 360 days.      Passed - Valid encounter within last 6 months    Recent Outpatient Visits          5 months ago Encounter for routine adult medical exam with abnormal findings   [facility] -Georges Mouse, MD   1 year ago Leg swelling   [place] John, James W, MD   1 year ago Wellness examination   [place] John, James W, MD   1 year ago Leg swelling   [facility] -Georges Mouse, MD   1 year ago Hyperlipidemia, unspecified hyperlipidemia type   [facility] -Georges Mouse, MD      Future Appointments            In 1 month Jenny Reichmann Hunt Oris, MD [facility]            cyclobenzaprine (FLEXERIL) 5 MG tablet 40 tablet 0    Sig: Take 1 tablet (5 mg total) by mouth 3 (three) times daily as needed for muscle spasms.     Not Delegated - Analgesics:  Muscle Relaxants Failed - 06/17/2019  3:03 PM      Failed - This refill cannot be delegated      Passed - Valid encounter within last 6 months    Recent Outpatient Visits          5 months ago Encounter for routine adult medical exam with abnormal findings   [facility] -[place] Bame, Hunt Oris, MD   1 year ago Leg swelling   [place], James W, MD   1  year ago Wellness examination   [place], James W, MD   1 year ago Leg swelling   [facility] -Georges Mouse, MD   1 year ago Hyperlipidemia, unspecified hyperlipidemia type   [facility] -Georges Mouse, MD      Future Appointments            In 1 month Biagio Borg, MD [facility]            levothyroxine (SYNTHROID) 25 MCG tablet 90 tablet 3    Sig: Take 1 tablet (25 mcg total) by mouth daily before breakfast.     Endocrinology:  Hypothyroid Agents Failed - 06/17/2019  3:03 PM      Failed - TSH needs to be rechecked within 3 months after an abnormal result. Refill until TSH is due.      Failed - TSH in normal range and within 360 days    TSH  Date Value Ref Range Status  01/16/2019  5.97 (H) 0.35 - 4.50 uIU/mL Final         Passed - Valid encounter within last 12 months    Recent Outpatient Visits          5 months ago Encounter for routine adult medical exam with abnormal findings   [facility] -Georges Mouse, MD   1 year ago Leg swelling   [place] John, James W, MD   1 year ago Wellness examination   [facility] -Georges Mouse, MD   1 year ago Leg swelling   [facility] -Georges Mouse, MD   1 year ago Hyperlipidemia, unspecified hyperlipidemia type   [facility] -Georges Mouse, MD      Future Appointments            In 1 month Jenny Reichmann, Hunt Oris, MD [place]

## 2019-06-27 ENCOUNTER — Other Ambulatory Visit: Payer: Self-pay | Admitting: Internal Medicine

## 2019-06-27 MED ORDER — TRIAMTERENE-HCTZ 75-50 MG PO TABS: 1.0000 | ORAL_TABLET | Freq: Every day | ORAL | 0 refills | Status: DC

## 2019-06-27 NOTE — Telephone Encounter (Signed)
Copied from [place] [id number]. Topic: Quick Communication - Rx Refill/Question >> Jun 27, 2019  9:44 AM Yvette Rack wrote: Medication: triamterene-hydrochlorothiazide (MAXZIDE) 75-50 MG tablet  Has the patient contacted their pharmacy? yes   Preferred Pharmacy (with phone number or street name): [facility] ([address] [place] [phone] (Phone)  [phone] (Fax)  Agent: Please be advised that RX refills may take up to 3 business days. We ask that you follow-up with your pharmacy.

## 2019-07-24 ENCOUNTER — Encounter: Payer: Self-pay | Admitting: Internal Medicine

## 2019-07-24 ENCOUNTER — Other Ambulatory Visit: Payer: Self-pay

## 2019-07-24 ENCOUNTER — Other Ambulatory Visit (INDEPENDENT_AMBULATORY_CARE_PROVIDER_SITE_OTHER): Payer: 59

## 2019-07-24 ENCOUNTER — Other Ambulatory Visit: Payer: Self-pay | Admitting: Internal Medicine

## 2019-07-24 ENCOUNTER — Ambulatory Visit (INDEPENDENT_AMBULATORY_CARE_PROVIDER_SITE_OTHER)
Admission: RE | Admit: 2019-07-24 | Discharge: 2019-07-24 | Disposition: A | Discharge status: Home / Self Care | Payer: 59 | Source: Ambulatory Visit | Attending: Internal Medicine | Admitting: Internal Medicine

## 2019-07-24 ENCOUNTER — Ambulatory Visit (INDEPENDENT_AMBULATORY_CARE_PROVIDER_SITE_OTHER): Payer: 59 | Admitting: Internal Medicine

## 2019-07-24 VITALS — BP 134/86 | HR 96 | Temp 98.7°F | Ht 69.0 in | Wt 284.0 lb

## 2019-07-24 DIAGNOSIS — E611 Iron deficiency: Secondary | ICD-10-CM | POA: Diagnosis not present

## 2019-07-24 DIAGNOSIS — R1032 Left lower quadrant pain: Secondary | ICD-10-CM | POA: Diagnosis not present

## 2019-07-24 DIAGNOSIS — E559 Vitamin D deficiency, unspecified: Secondary | ICD-10-CM

## 2019-07-24 DIAGNOSIS — E538 Deficiency of other specified B group vitamins: Secondary | ICD-10-CM

## 2019-07-24 DIAGNOSIS — R739 Hyperglycemia, unspecified: Secondary | ICD-10-CM

## 2019-07-24 DIAGNOSIS — I10 Essential (primary) hypertension: Secondary | ICD-10-CM

## 2019-07-24 DIAGNOSIS — E039 Hypothyroidism, unspecified: Secondary | ICD-10-CM

## 2019-07-24 DIAGNOSIS — R1031 Right lower quadrant pain: Secondary | ICD-10-CM | POA: Insufficient documentation

## 2019-07-24 DIAGNOSIS — B079 Viral wart, unspecified: Secondary | ICD-10-CM

## 2019-07-24 DIAGNOSIS — E785 Hyperlipidemia, unspecified: Secondary | ICD-10-CM

## 2019-07-24 DIAGNOSIS — K429 Umbilical hernia without obstruction or gangrene: Secondary | ICD-10-CM

## 2019-07-24 HISTORY — DX: Hypothyroidism, unspecified: E03.9

## 2019-07-24 LAB — BASIC METABOLIC PANEL
BUN: 24 mg/dL — ABNORMAL HIGH (ref 6–23)
CO2: 30 mEq/L (ref 19–32)
Calcium: 10.1 mg/dL (ref 8.4–10.5)
Chloride: 95 mEq/L — ABNORMAL LOW (ref 96–112)
Creatinine, Ser: 1.37 mg/dL (ref 0.40–1.50)
GFR: 63.26 mL/min (ref >60.00)
Glucose, Bld: 144 mg/dL — ABNORMAL HIGH (ref 70–99)
Potassium: 3.8 mEq/L (ref 3.5–5.1)
Sodium: 136 mEq/L (ref 135–145)

## 2019-07-24 LAB — VITAMIN B12: Vitamin B-12: 314 pg/mL (ref 211–911)

## 2019-07-24 LAB — LIPID PANEL
Cholesterol: 161 mg/dL (ref 0–200)
HDL: 40.2 mg/dL (ref >39.00)
LDL Cholesterol: 93 mg/dL (ref 0–99)
NonHDL: 120.74
Total CHOL/HDL Ratio: 4
Triglycerides: 137 mg/dL (ref 0.0–149.0)
VLDL: 27.4 mg/dL (ref 0.0–40.0)

## 2019-07-24 LAB — HEPATIC FUNCTION PANEL
ALT: 29 U/L (ref 0–53)
AST: 23 U/L (ref 0–37)
Albumin: 4.2 g/dL (ref 3.5–5.2)
Alkaline Phosphatase: 76 U/L (ref 39–117)
Bilirubin, Direct: 0.2 mg/dL (ref 0.0–0.3)
Total Bilirubin: 0.8 mg/dL (ref 0.2–1.2)
Total Protein: 7.9 g/dL (ref 6.0–8.3)

## 2019-07-24 LAB — IBC PANEL
Iron: 101 ug/dL (ref 42–165)
Saturation Ratios: 29.9 % (ref 20.0–50.0)
Transferrin: 241 mg/dL (ref 212.0–360.0)

## 2019-07-24 LAB — TSH: TSH: 4.49 u[IU]/mL (ref 0.35–4.50)

## 2019-07-24 LAB — T4, FREE: Free T4: 0.83 ng/dL (ref 0.60–1.60)

## 2019-07-24 LAB — VITAMIN D 25 HYDROXY (VIT D DEFICIENCY, FRACTURES): VITD: 21.35 ng/mL — ABNORMAL LOW (ref 30.00–100.00)

## 2019-07-24 LAB — HEMOGLOBIN A1C: Hgb A1c MFr Bld: 6.2 % (ref 4.6–6.5)

## 2019-07-24 IMAGING — DX DG HIP (WITH OR WITHOUT PELVIS) 3-4V BILAT
5 series · 5 of 5 positions shown · non-contrast
Comparison: None.

CLINICAL DATA: Chronic left groin pain.

EXAM:
DG HIP (WITH OR WITHOUT PELVIS) 3-4V BILAT

[pelvis ap]
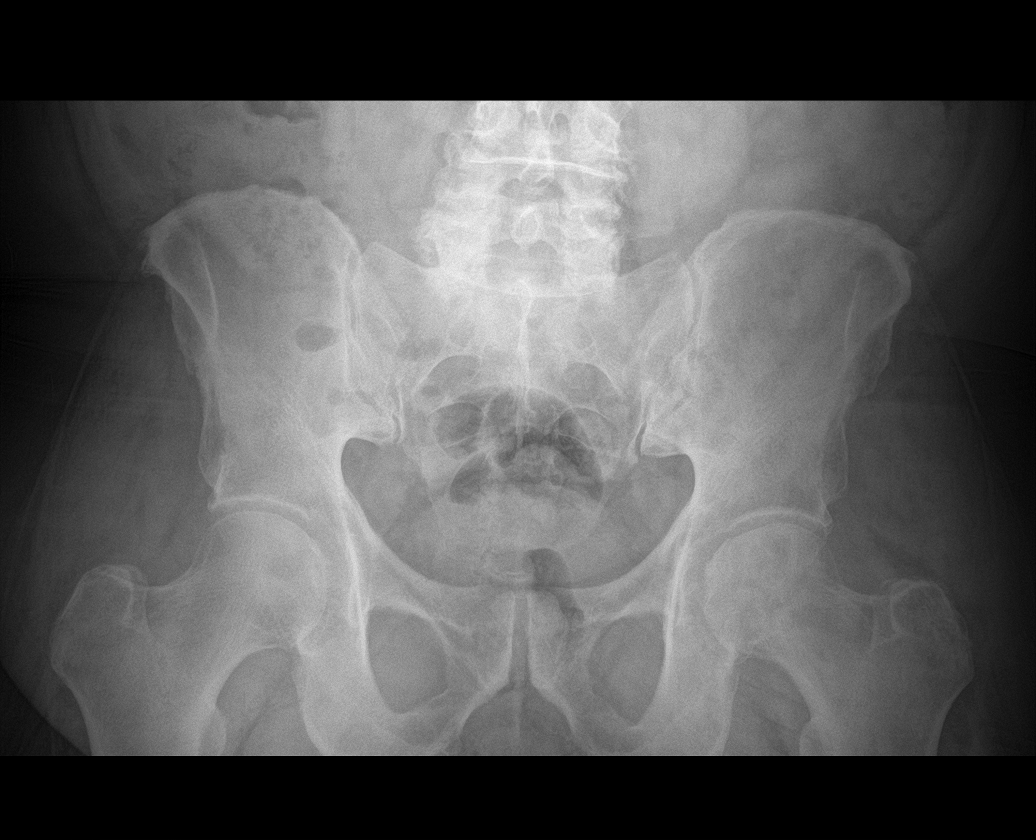

[hip ap (1 of 2)]
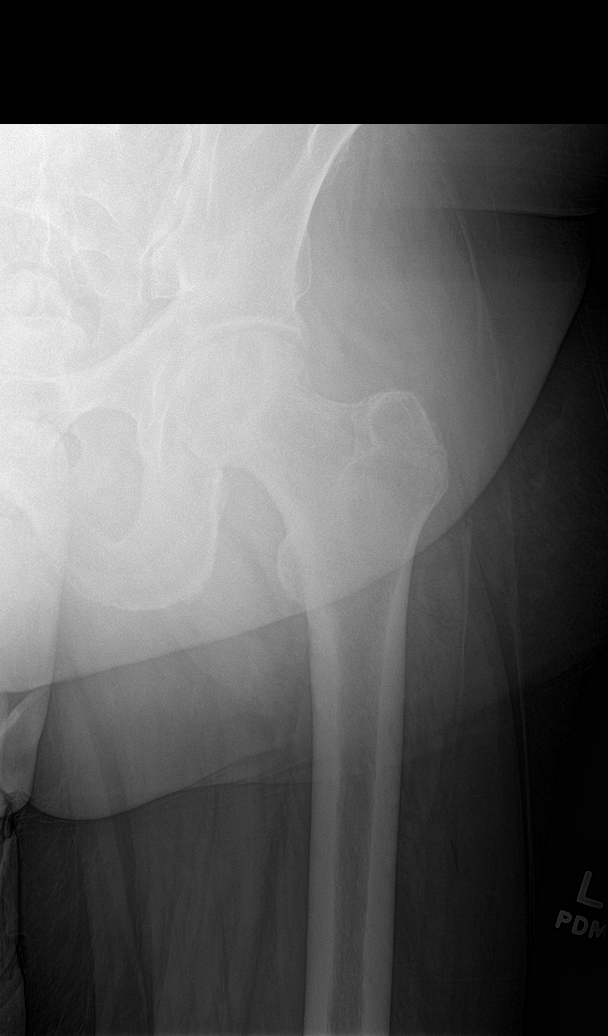

[hip lat (1 of 2)]
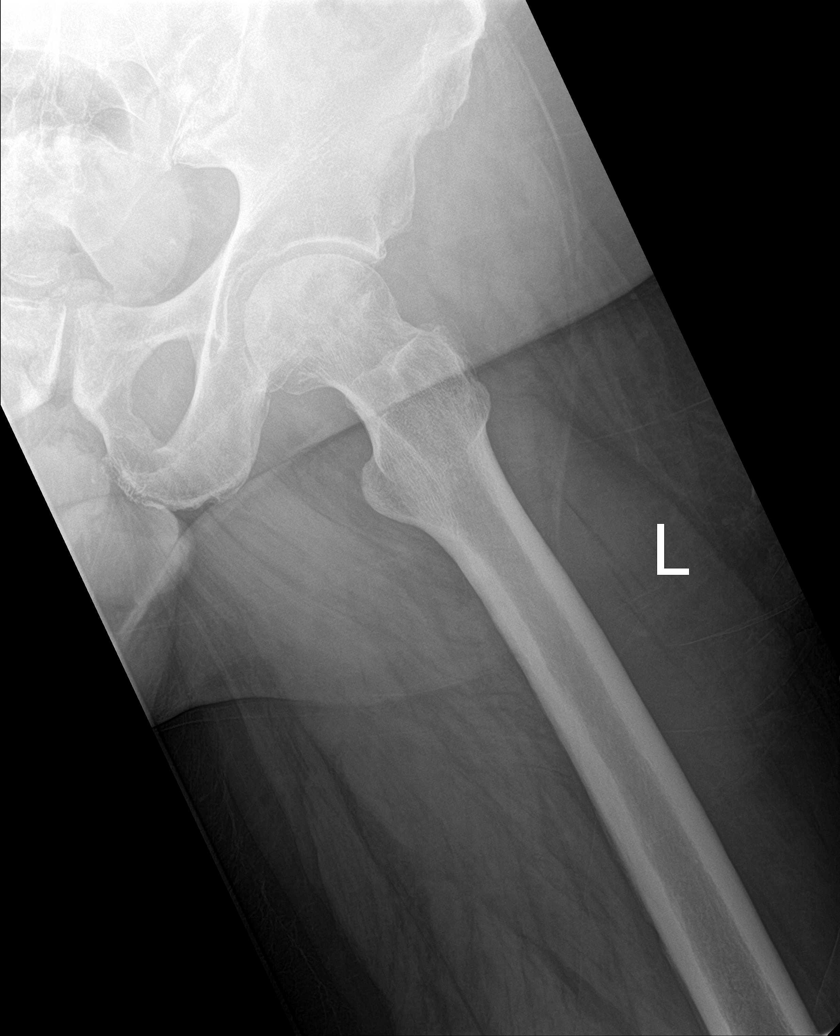

[hip ap (2 of 2)]
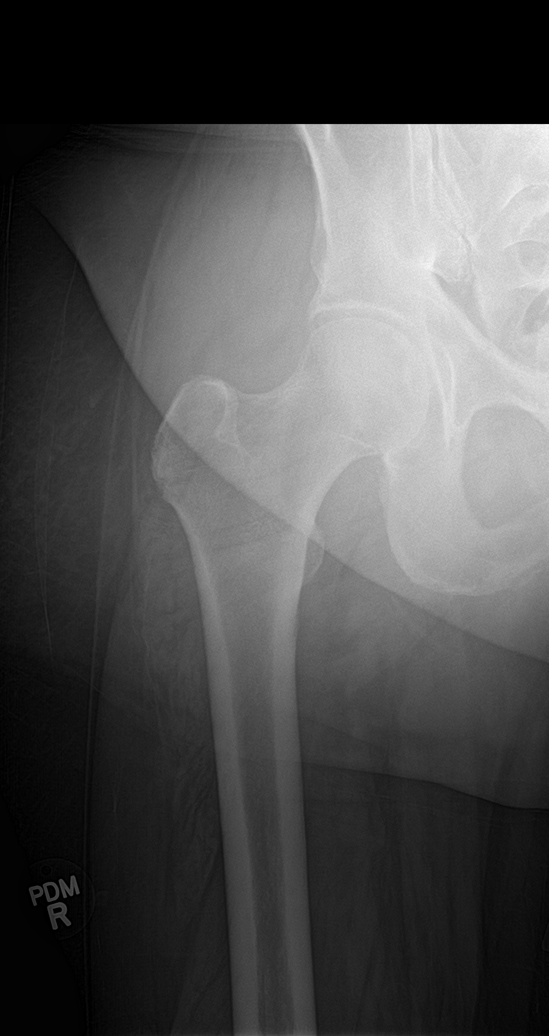

[hip lat (2 of 2)]
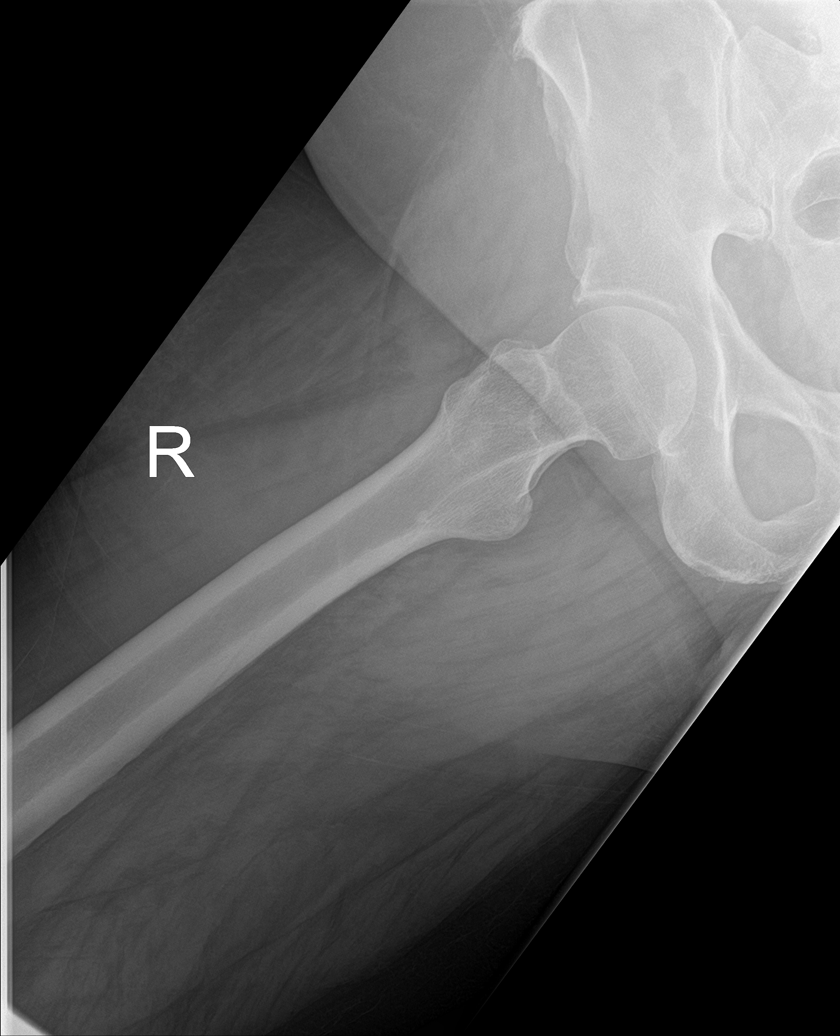

[5 of 5 positions shown; findings below may reference images not displayed]

FINDINGS: AP view of the pelvis and AP/frog leg views of both hips.

Femoral heads are located. Sacroiliac joints are symmetric. Joint
spaces are maintained for age.
IMPRESSION: No acute osseous abnormality.

## 2019-07-24 MED ORDER — VITAMIN D (ERGOCALCIFEROL) 1.25 MG (50000 UNIT) PO CAPS: 50000.0000 [IU] | ORAL_CAPSULE | ORAL | 0 refills | Status: DC

## 2019-07-24 NOTE — Patient Instructions (Signed)
Please continue all other medications as before, and refills have been done if requested.  Please have the pharmacy call with any other refills you may need.  Please continue your efforts at being more active, low cholesterol diet, and weight control.  You are otherwise up to date with prevention measures today.  Please keep your appointments with your specialists as you may have planned  Please go to the XRAY Department in the Basement (go straight as you get off the elevator) for the x-ray testing  Please go to the LAB in the Basement (turn left off the elevator) for the tests to be done today  You will be contacted by phone if any changes need to be made immediately.  Otherwise, you will receive a letter about your results with an explanation, but please check with MyChart first.  Please remember to sign up for MyChart if you have not done so, as this will be important to you in the future with finding out test results, communicating by private email, and scheduling acute appointments online when needed.  Please return in 6 months, or sooner if needed, with Lab testing done 3-5 days before

## 2019-07-24 NOTE — Progress Notes (Signed)
Subjective:    Patient ID: Michael Khan, male    DOB: 10-27-1955, 63 y.o.   MRN: [id number]  HPI    Here with multiple complaints; has a right mid post arm wart lesion > 10 mm, just wont get better with otc remedies, nontender, x > 6 mo, getting larger possibly.  Also has noticed umbilical hernia slightly larger x 6 mo, worse to stand or bend forward, but no pain and Denies worsening reflux, abd pain, dysphagia, n/v, bowel change or blood.  Also c/o mild intermittent left groin pain x 6 mo, worse to move the left hip, stand and walk, better to sit or be still, no prior hx of pain, and Denies urinary symptoms such as dysuria, frequency, urgency, flank pain, hematuria or n/v, fever, chills. Denies hyper or hypo thyroid symptoms such as voice, skin or hair change.  Also wife died in 2023/05/13, much grieving recent, but Denies worsening depressive symptoms, suicidal ideation, or panic Past Medical History:  Diagnosis Date  . Guillain Barr syndrome ([facility])    When he was 35, he received a flu shot caused him to develop Guillain Barre Syndrome  . Hyperlipemia   . Hypothyroidism 07/24/2019  . Measles    Past Surgical History:  Procedure Laterality Date  . ANKLE RECONSTRUCTION    . CHOLECYSTECTOMY    . NOSE SURGERY    . WRIST RECONSTRUCTION      reports that he has never smoked. He has never used smokeless tobacco. He reports that he does not drink alcohol or use drugs. family history includes Healthy in his father; Liver cancer in his mother. Allergies  Allergen Reactions  . Lipitor [Atorvastatin Calcium]     discomfort   Current Outpatient Medications on File Prior to Visit  Medication Sig Dispense Refill  . aspirin 81 MG EC tablet Take 1 tablet (81 mg total) by mouth daily. Swallow whole. 30 tablet 12  . cyclobenzaprine (FLEXERIL) 5 MG tablet Take 1 tablet (5 mg total) by mouth 3 (three) times daily as needed for muscle spasms. 40 tablet 1  . levothyroxine (SYNTHROID) 25 MCG tablet Take 1  tablet (25 mcg total) by mouth daily before breakfast. 90 tablet 1  . rosuvastatin (CRESTOR) 10 MG tablet Take 1 tablet by mouth once daily 30 tablet 2  . traMADol (ULTRAM) 50 MG tablet Take 1 tablet (50 mg total) by mouth every 6 (six) hours as needed. 30 tablet 0  . triamterene-hydrochlorothiazide (MAXZIDE) 75-50 MG tablet Take 1 tablet by mouth once daily 30 tablet 2  . triamterene-hydrochlorothiazide (MAXZIDE) 75-50 MG tablet Take 1 tablet by mouth daily. 90 tablet 0   No current facility-administered medications on file prior to visit.    Review of Systems  Constitutional: Negative for other unusual diaphoresis or sweats HENT: Negative for ear discharge or swelling Eyes: Negative for other worsening visual disturbances Respiratory: Negative for stridor or other swelling  Gastrointestinal: Negative for worsening distension or other blood Genitourinary: Negative for retention or other urinary change Musculoskeletal: Negative for other MSK pain or swelling Skin: Negative for color change or other new lesions Neurological: Negative for worsening tremors and other numbness  Psychiatric/Behavioral: Negative for worsening agitation or other fatigue All otherwise neg per pt     Objective:   Physical Exam BP 134/86   Pulse 96   Temp 98.7 F (37.1 C) (Oral)   Ht 5\' 9"  (1.753 m)   Wt 284 lb (128.8 kg)   SpO2 98%   BMI  41.94 kg/m  VS noted,  Constitutional: Pt appears in NAD HENT: Head: NCAT.  Right Ear: External ear normal.  Left Ear: External ear normal.  Eyes: . Pupils are equal, round, and reactive to light. Conjunctivae and EOM are normal Nose: without d/c or deformity Neck: Neck supple. Gross normal ROM Cardiovascular: Normal rate and regular rhythm.   Pulmonary/Chest: Effort normal and breath sounds without rales or wheezing.  Abd:  Soft, NT, ND, + BS, no organomegaly, with reducible nontender mod sized umbilical hernia, Neurological: Pt is alert. At baseline orientation,  motor grossly intact Skin: Skin is warm. No rashes, + warty new lesions right mid post arm 10 mm, no LE edema + mild pain on external rotation left hip, groin o/w nontender, no mass or swelling Psychiatric: Pt behavior is normal without agitation  All otherwise neg per pt Lab Results  Component Value Date   WBC 8.6 01/16/2019   HGB 14.2 01/16/2019   HCT 41.1 01/16/2019   PLT 275.0 01/16/2019   GLUCOSE 144 (H) 07/24/2019   CHOL 161 07/24/2019   TRIG 137.0 07/24/2019   HDL 40.20 07/24/2019   LDLCALC 93 07/24/2019   ALT 29 07/24/2019   AST 23 07/24/2019   NA 136 07/24/2019   K 3.8 07/24/2019   CL 95 (L) 07/24/2019   CREATININE 1.37 07/24/2019   BUN 24 (H) 07/24/2019   CO2 30 07/24/2019   TSH 4.49 07/24/2019   PSA 0.37 01/16/2019   INR 1.0 10/03/2008   HGBA1C 6.2 07/24/2019      Assessment & Plan:

## 2019-07-26 ENCOUNTER — Encounter: Payer: Self-pay | Admitting: Internal Medicine

## 2019-07-26 NOTE — Assessment & Plan Note (Signed)
stable overall by history and exam, recent data reviewed with pt, and pt to continue medical treatment as before,  to f/u any worsening symptoms or concerns  

## 2019-07-26 NOTE — Assessment & Plan Note (Signed)
With some discomfort on ext rotation of the hip, ? Hip djd vs other msk - for xray

## 2019-07-26 NOTE — Assessment & Plan Note (Signed)
Slightly larger, but non tender and reducible, o/w stable overall by history and exam, recent data reviewed with pt, and pt to continue medical treatment as before,  to f/u any worsening symptoms or concerns

## 2019-07-26 NOTE — Assessment & Plan Note (Addendum)
Benign appaering, for derm referral  Note:  Total time for pt hx, exam, review of record with pt in the room, determination of diagnoses and plan for further eval and tx is > 40 min, with over 50% spent in coordination and counseling of patient including the differential dx, tx, further evaluation and other management of wart, umbilical hernia, left groin pin, hypothyroidism, HTN, hyperglycemia, HLD

## 2019-09-03 ENCOUNTER — Ambulatory Visit: Payer: 59 | Attending: Internal Medicine

## 2019-09-03 DIAGNOSIS — Z20822 Contact with and (suspected) exposure to covid-19: Secondary | ICD-10-CM

## 2019-09-05 LAB — NOVEL CORONAVIRUS, NAA: SARS-CoV-2, NAA: NOT DETECTED

## 2019-09-13 ENCOUNTER — Other Ambulatory Visit: Payer: Self-pay | Admitting: Internal Medicine

## 2019-09-13 NOTE — Telephone Encounter (Signed)
Please refill as per office routine med refill policy (all routine meds refilled for 3 mo or monthly per pt preference up to one year from last visit, then month to month grace period for 3 mo, then further med refills will have to be denied)  

## 2019-09-23 ENCOUNTER — Other Ambulatory Visit: Payer: Self-pay | Admitting: Internal Medicine

## 2019-09-23 ENCOUNTER — Telehealth: Payer: Self-pay

## 2019-09-23 NOTE — Telephone Encounter (Signed)
New message     1. Which medications need to be refilled? (please list name of each medication and dose if known) Vitamin D, Ergocalciferol, (DRISDOL) 1.25 MG (50000 UT) CAPS capsule  2. Which pharmacy/location (including street and city if local pharmacy) is medication to be sent to? [facility] on [address]    3. Do they need a 30 day or 90 day supply? 30 days supply

## 2019-09-23 NOTE — Telephone Encounter (Signed)
Duplicate request.. Pharmacy sent refill this am was denied bcz provider want him to start taking Vitamin D 2000 units otc. Called pt back inform him to start taking otc.Marland KitchenJohny Khan

## 2019-09-23 NOTE — Telephone Encounter (Signed)
MEDICATION:Vitamin D, Ergocalciferol, (DRISDOL) 1.25 MG (50000 UT) CAPS capsule  PHARMACY: [facility] [place] [place] [facility] [place] Phone:  [phone]  Fax:  [phone]       Comments:  Patient also have questions about covid vaccine. Please call pt back. **Let patient know to contact pharmacy at the end of the day to make sure medication is ready. **  ** Please notify patient to allow 48-72 hours to process**  **Encourage patient to contact the pharmacy for refills or they can request refills through [facility]**

## 2019-09-24 NOTE — Telephone Encounter (Signed)
error 

## 2019-10-11 ENCOUNTER — Telehealth: Payer: Self-pay | Admitting: Internal Medicine

## 2019-10-11 NOTE — Telephone Encounter (Signed)
Called pharmacy patient had prescription on file.  Pharmacy is going to fill med and contact patient for pickup

## 2019-10-11 NOTE — Telephone Encounter (Signed)
    Patient calling to discuss/ advice on getting Covid vaccine

## 2019-10-11 NOTE — Telephone Encounter (Signed)
        1. Which medications need to be refilled? (please list name of each medication and dose if known) rosuvastatin (CRESTOR) 10 MG tablet  2. Which pharmacy/location (including street and city if local pharmacy) is medication to be sent to? [facility] [place] - [address]  3. Do they need a 30 day or 90 day supply? [place]

## 2019-10-12 NOTE — Telephone Encounter (Signed)
Left detailed message for patient informing per PCP that it is okay to get the COVID vaccine and that after vaccination pt are made to wait 15 minutes for any adverse event.

## 2019-11-12 ENCOUNTER — Telehealth: Payer: Self-pay | Admitting: Internal Medicine

## 2019-11-12 NOTE — Telephone Encounter (Signed)
Pt is concerned about getting the covid vaccine because of his history of Guillain-Barre syndrome, since they think a flu shot is what caused this.   Also, FYI: He's still having pain in his hips and legs. Just wanted to make you aware for his next appt

## 2019-11-28 ENCOUNTER — Encounter: Payer: Self-pay | Admitting: Internal Medicine

## 2019-11-28 ENCOUNTER — Other Ambulatory Visit: Payer: Self-pay | Admitting: Internal Medicine

## 2019-11-28 ENCOUNTER — Ambulatory Visit: Payer: 59 | Attending: Internal Medicine

## 2019-11-28 ENCOUNTER — Other Ambulatory Visit: Payer: Self-pay

## 2019-11-28 ENCOUNTER — Ambulatory Visit: Payer: 59 | Admitting: Internal Medicine

## 2019-11-28 VITALS — BP 142/84 | HR 92 | Temp 98.0°F | Ht 69.0 in | Wt 294.4 lb

## 2019-11-28 DIAGNOSIS — R739 Hyperglycemia, unspecified: Secondary | ICD-10-CM

## 2019-11-28 DIAGNOSIS — Z0000 Encounter for general adult medical examination without abnormal findings: Secondary | ICD-10-CM | POA: Diagnosis not present

## 2019-11-28 DIAGNOSIS — I10 Essential (primary) hypertension: Secondary | ICD-10-CM

## 2019-11-28 DIAGNOSIS — Z23 Encounter for immunization: Secondary | ICD-10-CM

## 2019-11-28 DIAGNOSIS — E538 Deficiency of other specified B group vitamins: Secondary | ICD-10-CM | POA: Diagnosis not present

## 2019-11-28 DIAGNOSIS — E785 Hyperlipidemia, unspecified: Secondary | ICD-10-CM

## 2019-11-28 DIAGNOSIS — E559 Vitamin D deficiency, unspecified: Secondary | ICD-10-CM | POA: Diagnosis not present

## 2019-11-28 DIAGNOSIS — Z0001 Encounter for general adult medical examination with abnormal findings: Secondary | ICD-10-CM

## 2019-11-28 DIAGNOSIS — E611 Iron deficiency: Secondary | ICD-10-CM | POA: Diagnosis not present

## 2019-11-28 DIAGNOSIS — E039 Hypothyroidism, unspecified: Secondary | ICD-10-CM

## 2019-11-28 DIAGNOSIS — M25552 Pain in left hip: Secondary | ICD-10-CM

## 2019-11-28 DIAGNOSIS — M25551 Pain in right hip: Secondary | ICD-10-CM | POA: Insufficient documentation

## 2019-11-28 LAB — HEMOGLOBIN A1C: Hgb A1c MFr Bld: 6.1 % (ref 4.6–6.5)

## 2019-11-28 LAB — IBC PANEL
Iron: 107 ug/dL (ref 42–165)
Saturation Ratios: 35.7 % (ref 20.0–50.0)
Transferrin: 214 mg/dL (ref 212.0–360.0)

## 2019-11-28 LAB — HEPATIC FUNCTION PANEL
ALT: 29 U/L (ref 0–53)
AST: 24 U/L (ref 0–37)
Albumin: 4.1 g/dL (ref 3.5–5.2)
Alkaline Phosphatase: 60 U/L (ref 39–117)
Bilirubin, Direct: 0.1 mg/dL (ref 0.0–0.3)
Total Bilirubin: 0.7 mg/dL (ref 0.2–1.2)
Total Protein: 7 g/dL (ref 6.0–8.3)

## 2019-11-28 LAB — CBC WITH DIFFERENTIAL/PLATELET
Basophils Absolute: 0.2 K/uL — ABNORMAL HIGH (ref 0.0–0.1)
Basophils Relative: 1.4 % (ref 0.0–3.0)
Eosinophils Absolute: 0.4 K/uL (ref 0.0–0.7)
Eosinophils Relative: 3.2 % (ref 0.0–5.0)
HCT: 44.5 % (ref 39.0–52.0)
Hemoglobin: 14.4 g/dL (ref 13.0–17.0)
Lymphocytes Relative: 27.8 % (ref 12.0–46.0)
Lymphs Abs: 3.1 K/uL (ref 0.7–4.0)
MCHC: 32.4 g/dL (ref 30.0–36.0)
MCV: 94.6 fl (ref 78.0–100.0)
Monocytes Absolute: 1.3 K/uL — ABNORMAL HIGH (ref 0.1–1.0)
Monocytes Relative: 12.2 % — ABNORMAL HIGH (ref 3.0–12.0)
Neutro Abs: 6.1 K/uL (ref 1.4–7.7)
Neutrophils Relative %: 55.4 % (ref 43.0–77.0)
Platelets: 216 K/uL (ref 150.0–400.0)
RBC: 4.71 Mil/uL (ref 4.22–5.81)
RDW: 14.3 % (ref 11.5–15.5)
WBC: 11.1 K/uL — ABNORMAL HIGH (ref 4.0–10.5)

## 2019-11-28 LAB — URINALYSIS, ROUTINE W REFLEX MICROSCOPIC
Bilirubin Urine: NEGATIVE
Hgb urine dipstick: NEGATIVE
Ketones, ur: NEGATIVE
Leukocytes,Ua: NEGATIVE
Nitrite: NEGATIVE
RBC / HPF: NONE SEEN
Specific Gravity, Urine: 1.025 (ref 1.000–1.030)
Total Protein, Urine: NEGATIVE
Urine Glucose: NEGATIVE
Urobilinogen, UA: 1 (ref 0.0–1.0)
pH: 6 (ref 5.0–8.0)

## 2019-11-28 LAB — BASIC METABOLIC PANEL WITH GFR
BUN: 26 mg/dL — ABNORMAL HIGH (ref 6–23)
CO2: 29 meq/L (ref 19–32)
Calcium: 9.7 mg/dL (ref 8.4–10.5)
Chloride: 102 meq/L (ref 96–112)
Creatinine, Ser: 1.2 mg/dL (ref 0.40–1.50)
GFR: 73.63 mL/min
Glucose, Bld: 114 mg/dL — ABNORMAL HIGH (ref 70–99)
Potassium: 4.3 meq/L (ref 3.5–5.1)
Sodium: 138 meq/L (ref 135–145)

## 2019-11-28 LAB — MICROALBUMIN / CREATININE URINE RATIO
Creatinine,U: 119.1 mg/dL
Microalb Creat Ratio: 0.6 mg/g (ref 0.0–30.0)
Microalb, Ur: <0.7 mg/dL (ref 0.0–1.9)

## 2019-11-28 LAB — LIPID PANEL
Cholesterol: 143 mg/dL (ref 0–200)
HDL: 38.9 mg/dL — ABNORMAL LOW
LDL Cholesterol: 69 mg/dL (ref 0–99)
NonHDL: 104.41
Total CHOL/HDL Ratio: 4
Triglycerides: 176 mg/dL — ABNORMAL HIGH (ref 0.0–149.0)
VLDL: 35.2 mg/dL (ref 0.0–40.0)

## 2019-11-28 LAB — VITAMIN B12: Vitamin B-12: 224 pg/mL (ref 211–911)

## 2019-11-28 LAB — TSH: TSH: 6.25 u[IU]/mL — ABNORMAL HIGH (ref 0.35–4.50)

## 2019-11-28 LAB — VITAMIN D 25 HYDROXY (VIT D DEFICIENCY, FRACTURES): VITD: 26.72 ng/mL — ABNORMAL LOW (ref 30.00–100.00)

## 2019-11-28 LAB — PSA: PSA: 0.5 ng/mL (ref 0.10–4.00)

## 2019-11-28 MED ORDER — NAPROXEN 500 MG PO TABS: 500.0000 mg | ORAL_TABLET | Freq: Two times a day (BID) | ORAL | 2 refills | Status: DC | PRN

## 2019-11-28 MED ORDER — VITAMIN D (ERGOCALCIFEROL) 1.25 MG (50000 UNIT) PO CAPS: 50000.0000 [IU] | ORAL_CAPSULE | ORAL | 0 refills | Status: DC

## 2019-11-28 MED ORDER — LEVOTHYROXINE SODIUM 50 MCG PO TABS: 50.0000 ug | ORAL_TABLET | Freq: Every day | ORAL | 3 refills | Status: DC

## 2019-11-28 NOTE — Patient Instructions (Addendum)
Thanks, [name], your appointment is scheduled! [place], [place], [address] Thursday November 28, 2019 Arrive by 1:15 PM Starts at 1:15 PM (15 minutes)   [place] [phone]  This visit is at the [address] [place], [place] [address]. Someone will direct you where to park. For scheduling questions, or to cancel please call [phone]. We will schedule your second dose appointment on site when you receive your first dose.   Please take all new medication as prescribed - the naproxen anti-inflammatory for pain  Please make an appt at the Sports Medicine on the first floor for the left hip and leg pain  Please continue all other medications as before, and refills have been done if requested.  Please have the pharmacy call with any other refills you may need.  Please continue your efforts at being more active, low cholesterol diet, and weight control.  You are otherwise up to date with prevention measures today.  Please keep your appointments with your specialists as you may have planned  Please go to the LAB at the blood drawing area for the tests to be done  You will be contacted by phone if any changes need to be made immediately.  Otherwise, you will receive a letter about your results with an explanation, but please check with MyChart first.  Please remember to sign up for MyChart if you have not done so, as this will be important to you in the future with finding out test results, communicating by private email, and scheduling acute appointments online when needed.  Please make an Appointment to return in 6 months, or sooner if needed

## 2019-11-28 NOTE — Progress Notes (Signed)
Subjective:    Patient ID: Michael Khan, male    DOB: [birth year], 64 y.o.   MRN: [id number]  HPI  Here for wellness and f/u;  Overall doing ok;  Pt denies Chest pain, worsening SOB, DOE, wheezing, orthopnea, PND, worsening LE edema, palpitations, dizziness or syncope.  Pt denies neurological change such as new headache, facial or extremity weakness.  Pt denies polydipsia, polyuria, or low sugar symptoms. Pt states overall good compliance with treatment and medications, good tolerability, and has been trying to follow appropriate diet.  Pt denies worsening depressive symptoms, suicidal ideation or panic. No fever, night sweats, wt loss, loss of appetite, or other constitutional symptoms.  Pt states good ability with ADL's, has low fall risk, home safety reviewed and adequate, no other significant changes in hearing or vision, and only occasionally active with exercise.   Also with c/o left lateral hip pain moderate intermittent to now constant, x 2 wks worsening, worse to lie on left side Past Medical History:  Diagnosis Date  . Guillain Barr syndrome ([facility])    When he was 59, he received a flu shot caused him to develop Guillain Barre Syndrome  . Hyperlipemia   . Hypothyroidism 07/24/2019  . Measles    Past Surgical History:  Procedure Laterality Date  . ANKLE RECONSTRUCTION    . CHOLECYSTECTOMY    . NOSE SURGERY    . WRIST RECONSTRUCTION      reports that he has never smoked. He has never used smokeless tobacco. He reports that he does not drink alcohol or use drugs. family history includes Healthy in his father; Liver cancer in his mother. Allergies  Allergen Reactions  . Drug Class [Haemophilus Influenzae Vaccines] Other (See Comments)  . Lipitor [Atorvastatin Calcium]     discomfort   Current Outpatient Medications on File Prior to Visit  Medication Sig Dispense Refill  . aspirin 81 MG EC tablet Take 1 tablet (81 mg total) by mouth daily. Swallow whole. 30 tablet 12  .  cyclobenzaprine (FLEXERIL) 5 MG tablet Take 1 tablet (5 mg total) by mouth 3 (three) times daily as needed for muscle spasms. 40 tablet 1  . rosuvastatin (CRESTOR) 10 MG tablet Take 1 tablet (10 mg total) by mouth daily. Annual appt due in May must see provider for future refills 30 tablet 3  . traMADol (ULTRAM) 50 MG tablet Take 1 tablet (50 mg total) by mouth every 6 (six) hours as needed. 30 tablet 0  . triamterene-hydrochlorothiazide (MAXZIDE) 75-50 MG tablet Take 1 tablet by mouth daily. (Patient not taking: Reported on 11/28/2019) 90 tablet 0   No current facility-administered medications on file prior to visit.   Review of Systems All otherwise neg per pt     Objective:   Physical Exam BP (!) 142/84   Pulse 92   Temp 98 F (36.7 C)   Ht 5\' 9"  (1.753 m)   Wt 294 lb 6.4 oz (133.5 kg)   SpO2 99%   BMI 43.48 kg/m  VS noted,  Constitutional: Pt appears in NAD HENT: Head: NCAT.  Right Ear: External ear normal.  Left Ear: External ear normal.  Eyes: . Pupils are equal, round, and reactive to light. Conjunctivae and EOM are normal Nose: without d/c or deformity Neck: Neck supple. Gross normal ROM Cardiovascular: Normal rate and regular rhythm.   Pulmonary/Chest: Effort normal and breath sounds without rales or wheezing.  Abd:  Soft, NT, ND, + BS, no organomegaly Neurological: Pt is alert. At  baseline orientation, motor grossly intact Skin: Skin is warm. No rashes, other new lesions, no LE edema Psychiatric: Pt behavior is normal without agitation  Tender over left lateral greater trochanter Lab Results  Component Value Date   WBC 11.1 (H) 11/28/2019   HGB 14.4 11/28/2019   HCT 44.5 11/28/2019   PLT 216.0 11/28/2019   GLUCOSE 114 (H) 11/28/2019   CHOL 143 11/28/2019   TRIG 176.0 (H) 11/28/2019   HDL 38.90 (L) 11/28/2019   LDLCALC 69 11/28/2019   ALT 29 11/28/2019   AST 24 11/28/2019   NA 138 11/28/2019   K 4.3 11/28/2019   CL 102 11/28/2019   CREATININE 1.20  11/28/2019   BUN 26 (H) 11/28/2019   CO2 29 11/28/2019   TSH 6.25 (H) 11/28/2019   PSA 0.50 11/28/2019   INR 1.0 10/03/2008   HGBA1C 6.1 11/28/2019   MICROALBUR <0.7 11/28/2019      Assessment & Plan:

## 2019-11-28 NOTE — Progress Notes (Signed)
   Covid-19 Vaccination Clinic  Name:  Michael Khan    MRN: [id number] DOB: [birth year]  11/28/2019  Mr. Michael Khan was observed post Covid-19 immunization for 30 minutes based on pre-vaccination screening without incident. He was provided with Vaccine Information Sheet and instruction to access the V-Safe system.   Mr. Michael Khan was instructed to call 911 with any severe reactions post vaccine: Marland Kitchen Difficulty breathing  . Swelling of face and throat  . A fast heartbeat  . A bad rash all over body  . Dizziness and weakness   Immunizations Administered    Name Date Dose VIS Date Route   Pfizer COVID-19 Vaccine 11/28/2019  1:12 PM 0.3 mL 08/09/2019 Intramuscular   Manufacturer: [employer], [employer]   Lot: [id number]   [place]: [id number]

## 2019-11-29 ENCOUNTER — Encounter: Payer: Self-pay | Admitting: Internal Medicine

## 2019-11-29 NOTE — Assessment & Plan Note (Signed)
stable overall by history and exam, recent data reviewed with pt, and pt to continue medical treatment as before,  to f/u any worsening symptoms or concerns  

## 2019-11-29 NOTE — Assessment & Plan Note (Signed)

## 2019-11-29 NOTE — Assessment & Plan Note (Addendum)
C/w possible bursitis, for naproxen prn, also to see sport medicine  I spent 30 minutes in addition to time for CPX wellness examination in preparing to see the patient by review of recent labs, imaging and procedures, obtaining and reviewing separately obtained history, communicating with the patient and family or caregiver, ordering medications, tests or procedures, and documenting clinical information in the EHR including the differential Dx, treatment, and any further evaluation and other management of left hip pain, hypothyroidism, HTN, hyperglycemia, HLD

## 2019-12-05 ENCOUNTER — Ambulatory Visit: Payer: Self-pay

## 2019-12-05 ENCOUNTER — Other Ambulatory Visit: Payer: Self-pay

## 2019-12-05 ENCOUNTER — Encounter: Payer: Self-pay | Admitting: Family Medicine

## 2019-12-05 ENCOUNTER — Ambulatory Visit: Payer: 59 | Admitting: Family Medicine

## 2019-12-05 ENCOUNTER — Ambulatory Visit (INDEPENDENT_AMBULATORY_CARE_PROVIDER_SITE_OTHER): Payer: 59

## 2019-12-05 VITALS — BP 148/90 | HR 97 | Ht 69.0 in | Wt 296.4 lb

## 2019-12-05 DIAGNOSIS — M25452 Effusion, left hip: Secondary | ICD-10-CM

## 2019-12-05 DIAGNOSIS — M25552 Pain in left hip: Secondary | ICD-10-CM

## 2019-12-05 DIAGNOSIS — K429 Umbilical hernia without obstruction or gangrene: Secondary | ICD-10-CM | POA: Diagnosis not present

## 2019-12-05 IMAGING — DX DG HIP (WITH OR WITHOUT PELVIS) 2-3V*L*
3 series · 3 of 3 positions shown · non-contrast
Comparison: [DATE]

CLINICAL DATA: Atraumatic left hip pain x1 year.

EXAM:
DG HIP (WITH OR WITHOUT PELVIS) 2-3V LEFT

[pelvis ap]
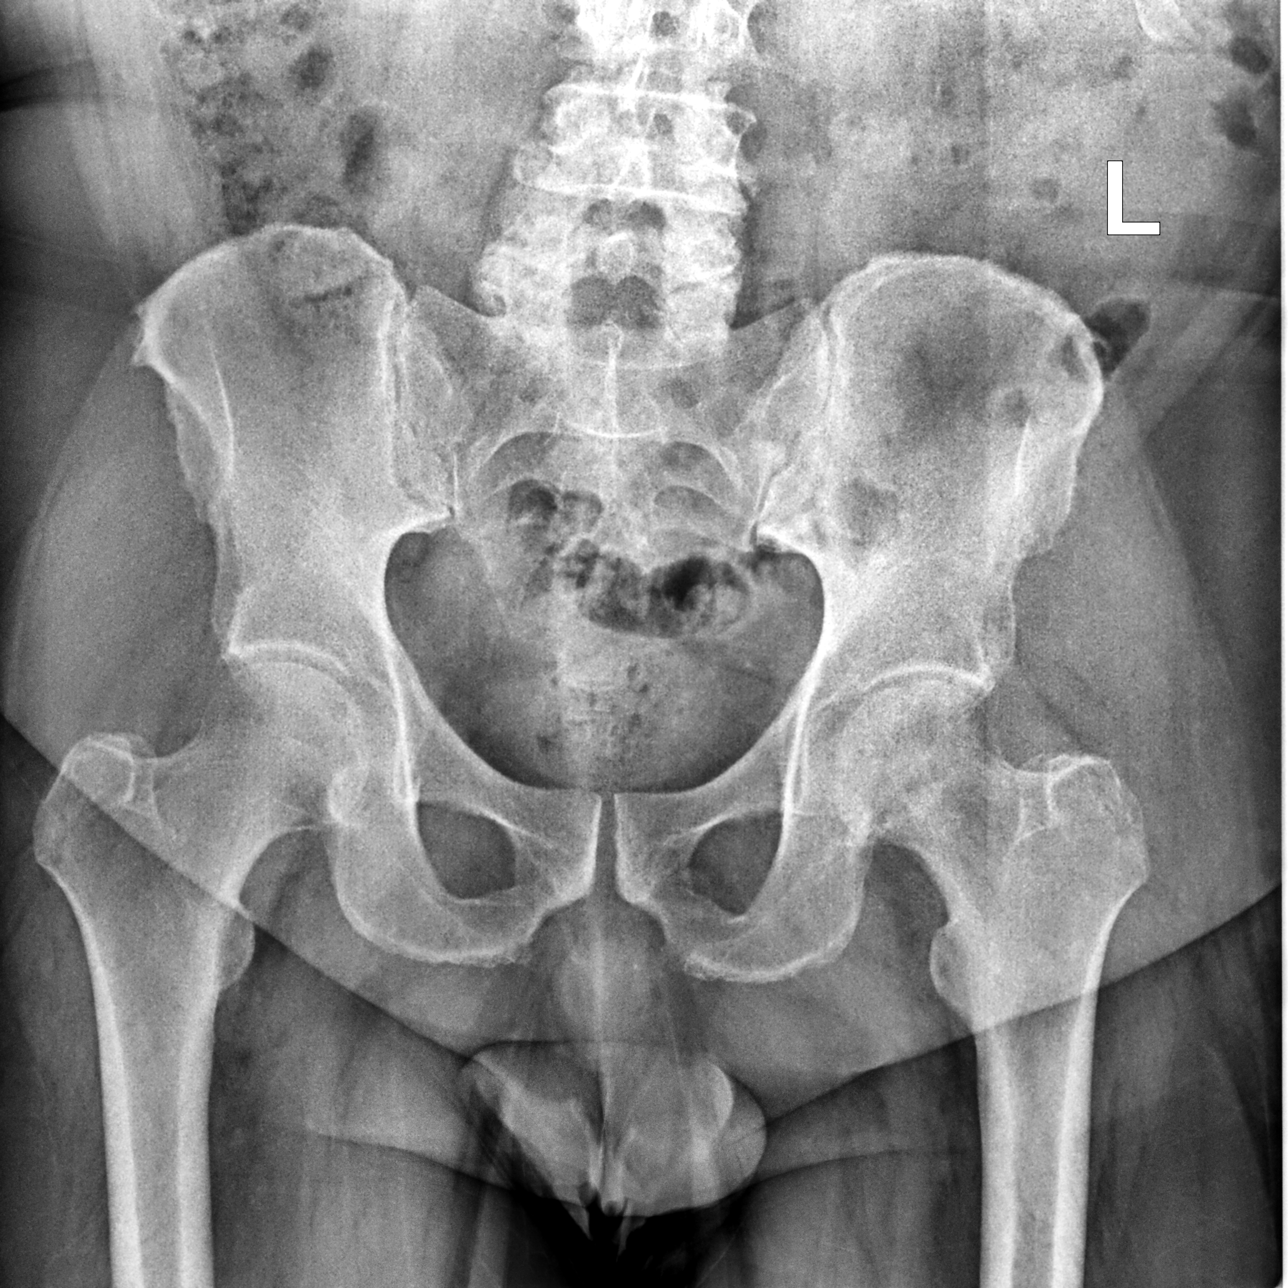

[hip ap]
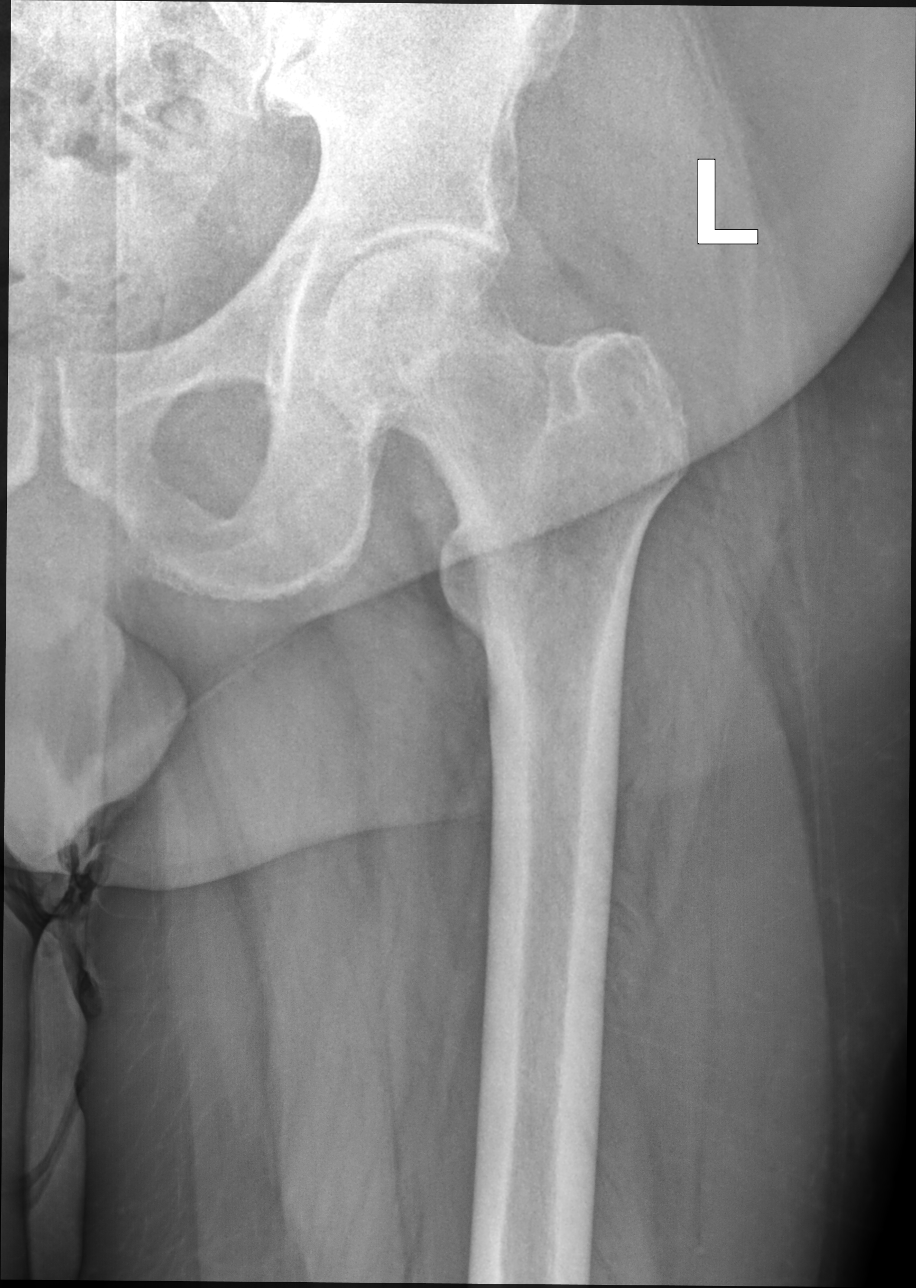

[hip (frog leg)]
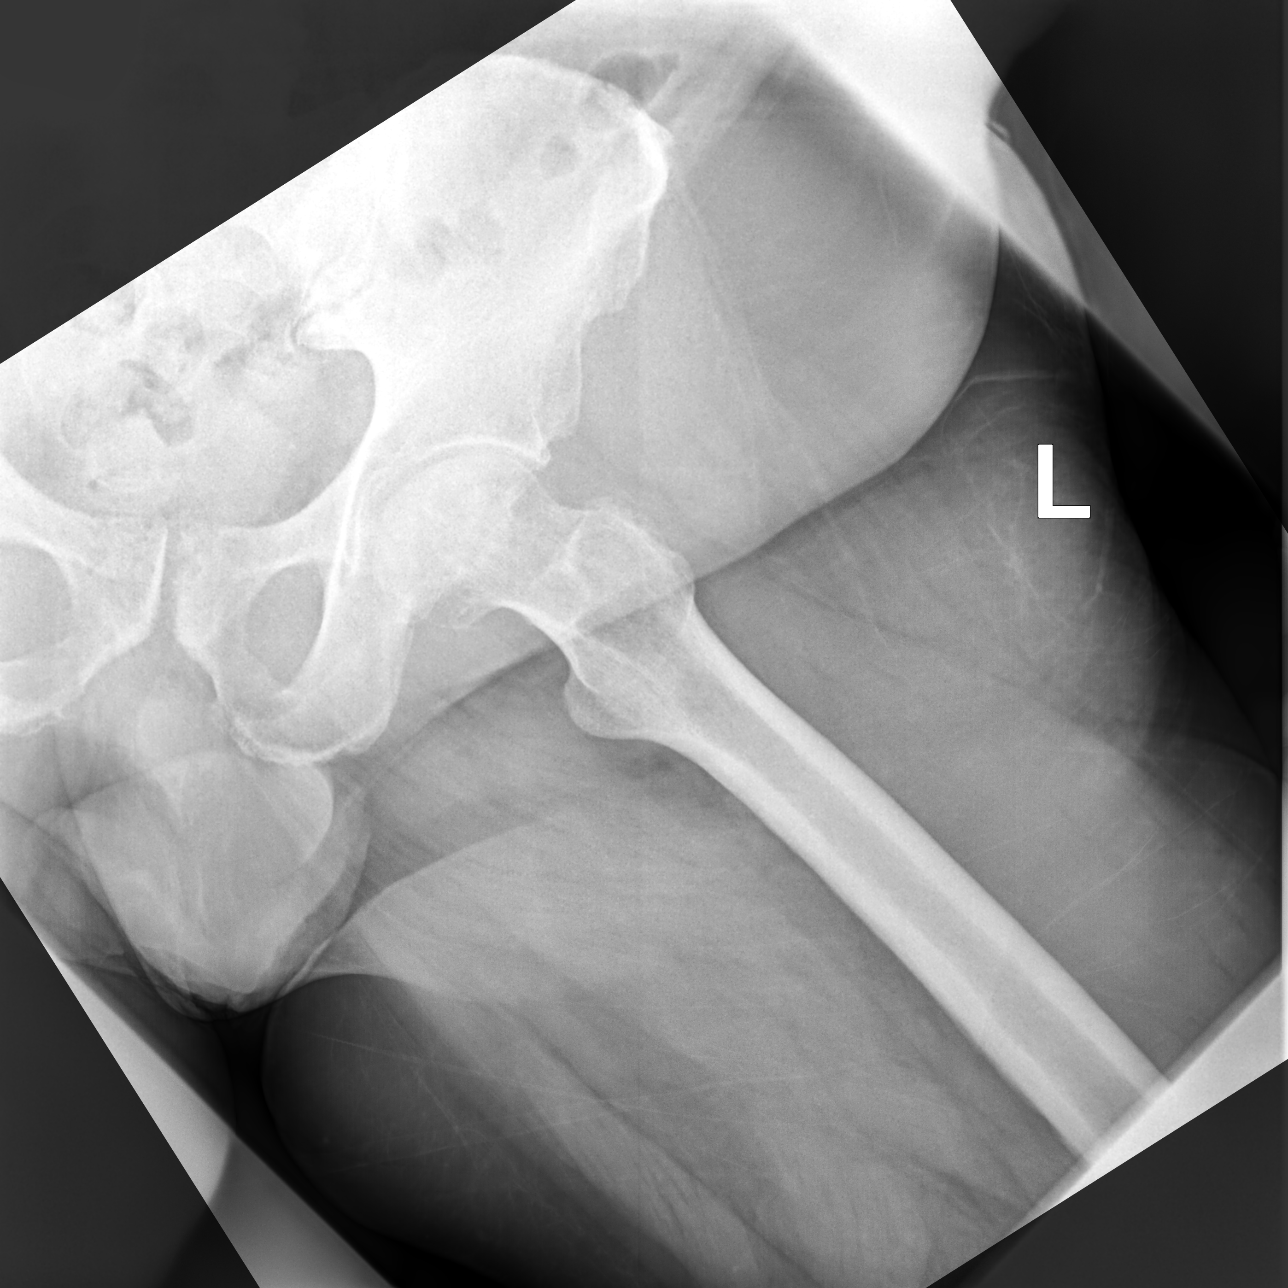

[3 of 3 positions shown; findings below may reference images not displayed]

FINDINGS: There is no evidence of acute hip fracture or dislocation. Mild
sclerosis of the periphery of the left humeral head is seen when
compared to the right humeral head. There is no evidence of
significant arthropathy. Soft tissue structures are unremarkable.
IMPRESSION: 1. No acute osseous abnormality.
2. Mild sclerosis of the periphery of the left humeral head.
Correlation with MRI is recommended, as sequelae associated with
early avascular necrosis cannot be excluded.

## 2019-12-05 NOTE — Progress Notes (Signed)
Subjective:    I'm seeing this patient as a consultation for:  Dr. Jenny Reichmann. Note will be routed back to referring provider/PCP.  CC: L hip and L knee pain  I, Molly Weber, LAT, ATC, am serving as scribe for Dr. Lynne Leader.  HPI: Pt is a 64 y/o male presenting w/ c/o L ant hip/groin and L ant thigh pain x 2 weeks w/ no known MOI.  Pt locates his pain to his L groin w/ radiating pain into his L ant thigh .  He rates his pain as severe at it's worst and today and is moderate.  He has a hx of guillan-barre symptoms.  He also reports 2 recent abdominal hernias.  Radiating pain: Yes from L lateral hip to L knee L LE numbness/tingling: Yes, intermittent Aggravating factors: Laying on his L side; L hip flexion AROM; coughing; prolonged standing; prolonged sitting Treatments tried: Voltaren, Blue Emu; pain-relieving oil; Tramadol; Naproxen  Diagnostic imaging: B hip/pelvis XR- 07/24/19  Past medical history, Surgical history, Family history, Social history, Allergies, and medications have been entered into the medical record, reviewed.   Review of Systems: No new headache, visual changes, nausea, vomiting, diarrhea, constipation, dizziness, abdominal pain, skin rash, fevers, chills, night sweats, weight loss, swollen lymph nodes, body aches, joint swelling, muscle aches, chest pain, shortness of breath, mood changes, visual or auditory hallucinations.   Objective:    Vitals:   12/05/19 1331  BP: (!) 148/90  Pulse: 97  SpO2: 97%   General: Well Developed, well nourished, and in no acute distress.  Neuro/Psych: Alert and oriented x3, extra-ocular muscles intact, able to move all 4 extremities, sensation grossly intact. Skin: Warm and dry, no rashes noted.  Respiratory: Not using accessory muscles, speaking in full sentences, trachea midline.  Cardiovascular: Pulses palpable, no extremity edema. Abdomen: Does not appear distended.  Small umbilical hernia present.  No inguinal hernia present  left side.  Testicles descended bilaterally. MSK: Left hip Normal-appearing Not particularly tender to palpation. Decreased hip motion with pain with flexion and internal rotation. Intact strength.   Lab and Radiology Results  X-ray images left hip obtained today personally and independently reviewed Left hip moderate DJD without acute fracture.  Slight progression from x-ray November 2020 Await formal radiology review  Procedure: Real-time Ultrasound Guided Injection of left hip femoral acetabular joint Device: Philips Affiniti 50G Images permanently stored and available for review in the ultrasound unit. Verbal informed consent obtained.  Discussed risks and benefits of procedure. Warned about infection bleeding damage to structures skin hypopigmentation and fat atrophy among others. Patient expresses understanding and agreement Time-out conducted.   Noted no overlying erythema, induration, or other signs of local infection.   Moderate hip effusion present on ultrasound examination Skin prepped in a sterile fashion.   Local anesthesia: Topical Ethyl chloride.   With sterile technique and under real time ultrasound guidance:  40 mg of Kenalog and 3 mL of Marcaine injected easily.   Completed without difficulty   Pain immediately resolved suggesting accurate placement of the medication.   Advised to call if fevers/chills, erythema, induration, drainage, or persistent bleeding.   Images permanently stored and available for review in the ultrasound unit.  Impression: Technically successful ultrasound guided injection.       Impression and Recommendations:    Assessment and Plan: 64 y.o. male with left hip pain.  Pain located anterior hip worse with hip flexion standing and hip rotation activities.  Exam and ultrasound examination consistent with hip  DJD.  X-ray November 2020 showed only mild hip DJD.  X-ray today shows DJD moderately to mildly progressed from November 2020  however radiology overread is still pending.  Patient had immediate significant benefit in pain control following diagnostic and therapeutic intra-articular left hip injection today.  If we get lucky his pain will continue to be well controlled with the steroid component of the injection however if pain control does not last very long his next best option is probably a hip replacement.  Recheck back with me as needed.  PDMP not reviewed this encounter. Orders Placed This Encounter  Procedures  . [place] LIMITED JOINT SPACE STRUCTURES LOW LEFT(NO LINKED CHARGES)    Order Specific Question:   Reason for Exam (SYMPTOM  OR DIAGNOSIS REQUIRED)    Answer:   L hip pain    Order Specific Question:   Preferred imaging location?    Answer:   [place]  . DG Hip Unilat W OR W/O Pelvis 2-3 Views Left    Standing Status:   Future    Standing Expiration Date:   02/03/2021    Order Specific Question:   Reason for Exam (SYMPTOM  OR DIAGNOSIS REQUIRED)    Answer:   Left hip pain    Order Specific Question:   Preferred imaging location?    Answer:   [facility]    Order Specific Question:   Radiology Contrast Protocol - do NOT remove file path    Answer:   \\[email or web address]   No orders of the defined types were placed in this encounter.   Discussed warning signs or symptoms. Please see discharge instructions. Patient expresses understanding.   The above documentation has been reviewed and is accurate and complete Lynne Leader

## 2019-12-05 NOTE — Patient Instructions (Addendum)
Thank you for coming in today. Call or go to the ER if you develop a large red swollen joint with extreme pain or oozing puss.   Pay attention to how the pain feels over today and the next few weeks.  Get xray today.   Let me know if it comes back soon.

## 2019-12-06 NOTE — Progress Notes (Signed)
Xray shoulder does show some changes that could cause pain. If not better we can get MRI to figure this out fully.

## 2019-12-13 ENCOUNTER — Telehealth: Payer: Self-pay | Admitting: Family Medicine

## 2019-12-13 DIAGNOSIS — M25552 Pain in left hip: Secondary | ICD-10-CM

## 2019-12-13 DIAGNOSIS — M25452 Effusion, left hip: Secondary | ICD-10-CM

## 2019-12-13 NOTE — Telephone Encounter (Signed)
Patient called stating that his hip pain has returned and is causing him to have trouble walking again. He asked if he should have another injection or if he should go ahead and have the MRI.  Please advise.

## 2019-12-17 MED ORDER — HYDROCODONE-ACETAMINOPHEN 5-325 MG PO TABS: 1.0000 | ORAL_TABLET | Freq: Four times a day (QID) | ORAL | 0 refills | Status: DC | PRN

## 2019-12-17 NOTE — Telephone Encounter (Signed)
Hydrocodone sent to pharmacy.  Hopefully this will help better than tramadol.

## 2019-12-17 NOTE — Telephone Encounter (Signed)
At this point I think MRI is the next best bet.  MRI ordered.  This should be scheduled in the near future.

## 2019-12-17 NOTE — Telephone Encounter (Signed)
Patient called to follow up on this request.

## 2019-12-17 NOTE — Telephone Encounter (Signed)
Returned pt's call and informed him of the new rx.  Pt verbalizes understanding.

## 2019-12-17 NOTE — Addendum Note (Signed)
Addended by: Gregor Hams on: 12/17/2019 03:33 PM   Modules accepted: Orders

## 2019-12-17 NOTE — Telephone Encounter (Signed)
Called pt and informed him about the MRI order and provided the number for [facility].  Pt states that he would like something for pain sent to [facility] at [facility] [address]  He states that he has some Tramadol but notes this medication is not helping.  Please advise.

## 2019-12-23 ENCOUNTER — Telehealth: Payer: Self-pay | Admitting: Family Medicine

## 2019-12-23 ENCOUNTER — Telehealth: Payer: Self-pay | Admitting: Internal Medicine

## 2019-12-23 ENCOUNTER — Ambulatory Visit: Payer: 59 | Attending: Internal Medicine

## 2019-12-23 DIAGNOSIS — Z23 Encounter for immunization: Secondary | ICD-10-CM

## 2019-12-23 MED ORDER — NAPROXEN 500 MG PO TABS
500.0000 mg | ORAL_TABLET | Freq: Two times a day (BID) | ORAL | 2 refills | Status: DC | PRN
Start: 2019-12-23 — End: 2020-01-17

## 2019-12-23 NOTE — Telephone Encounter (Signed)
Done erx 

## 2019-12-23 NOTE — Progress Notes (Signed)
   Covid-19 Vaccination Clinic  Name:  CONEY MUSSEY    MRN: [id number] DOB: 1955/10/01  12/23/2019  Mr. Filak was observed post Covid-19 immunization for 15 minutes without incident. He was provided with Vaccine Information Sheet and instruction to access the V-Safe system.   Mr. Gameros was instructed to call 911 with any severe reactions post vaccine: Marland Kitchen Difficulty breathing  . Swelling of face and throat  . A fast heartbeat  . A bad rash all over body  . Dizziness and weakness   Immunizations Administered    Name Date Dose VIS Date Route   Pfizer COVID-19 Vaccine 12/23/2019 10:12 AM 0.3 mL 10/23/2018 Intramuscular   Manufacturer: [place]   Lot: [id number]   [place]: [id number]

## 2019-12-23 NOTE — Telephone Encounter (Signed)
New message:    1.Medication Requested: naproxen (NAPROSYN) 500 MG tablet 2. Pharmacy (Name, Street, [place]): [place] (SE), [place] - [address] 3. On Med List: Yes  4. Last Visit with PCP:   5. Next visit date with PCP:   Agent: Please be advised that RX refills may take up to 3 business days. We ask that you follow-up with your pharmacy.

## 2019-12-23 NOTE — Telephone Encounter (Signed)
Patient called requesting a refill on his HYDROcodone-acetaminophen (NORCO/VICODIN) 5-325 MG tablet sent to [facility] on [address].

## 2019-12-24 MED ORDER — HYDROCODONE-ACETAMINOPHEN 5-325 MG PO TABS: 1.0000 | ORAL_TABLET | Freq: Four times a day (QID) | ORAL | 0 refills | Status: DC | PRN

## 2019-12-24 NOTE — Telephone Encounter (Signed)
Medication refilled

## 2019-12-28 ENCOUNTER — Other Ambulatory Visit: Payer: Self-pay | Admitting: Internal Medicine

## 2019-12-28 NOTE — Telephone Encounter (Signed)
Please refill as per office routine med refill policy (all routine meds refilled for 3 mo or monthly per pt preference up to one year from last visit, then month to month grace period for 3 mo, then further med refills will have to be denied)  

## 2019-12-30 ENCOUNTER — Other Ambulatory Visit: Payer: Self-pay

## 2019-12-30 NOTE — Telephone Encounter (Signed)
1.Medication Requested:triamterene-hydrochlorothiazide (MAXZIDE) 75-50 MG tablet  2. Pharmacy (Name, Street, [place]):[place] (SE), [place] - [address]  3. On Med List: Yes   4. Last Visit with PCP: 4.1.2021  5. Next visit date with PCP: no appt is made at this time     Agent: Please be advised that RX refills may take up to 3 business days. We ask that you follow-up with your pharmacy.

## 2020-01-01 ENCOUNTER — Other Ambulatory Visit: Payer: Self-pay

## 2020-01-01 MED ORDER — HYDROCODONE-ACETAMINOPHEN 5-325 MG PO TABS: 1.0000 | ORAL_TABLET | Freq: Four times a day (QID) | ORAL | 0 refills | Status: DC | PRN

## 2020-01-01 NOTE — Telephone Encounter (Signed)
Please advise regarding refill.  

## 2020-01-01 NOTE — Telephone Encounter (Signed)
Patient called stating he has his MRI scheduled on the 22nd, and is wanting to know if he could get a refill on his medication as he is still in a lot of pain. Would like a refill to at least get him till the MRI

## 2020-01-08 ENCOUNTER — Telehealth: Payer: Self-pay | Admitting: Family Medicine

## 2020-01-08 MED ORDER — HYDROCODONE-ACETAMINOPHEN 5-325 MG PO TABS: 1.0000 | ORAL_TABLET | Freq: Three times a day (TID) | ORAL | 0 refills | Status: DC | PRN

## 2020-01-08 NOTE — Telephone Encounter (Signed)
Pt needs refill of his pain meds, he is completely out. [facility] on [address]

## 2020-01-08 NOTE — Telephone Encounter (Signed)
Hydrocodone refilled.  

## 2020-01-13 ENCOUNTER — Telehealth: Payer: Self-pay | Admitting: Internal Medicine

## 2020-01-13 NOTE — Telephone Encounter (Signed)
    1.Medication Requested:  rosuvastatin (CRESTOR) 10 MG tablet levothyroxine (SYNTHROID) 50 MCG tablet  2. Pharmacy (Name, Street, [place]): [place] (SE), [place] - [address]  3. On Med List: YES  4. Last Visit with PCP: 11/28/19  5. Next visit date with PCP:   Agent: Please be advised that RX refills may take up to 3 business days. We ask that you follow-up with your pharmacy.

## 2020-01-14 ENCOUNTER — Other Ambulatory Visit: Payer: Self-pay | Admitting: Family Medicine

## 2020-01-14 NOTE — Telephone Encounter (Signed)
F/u  The patient calling checking on the status of the refill prescription.   The patient is aware that a prescription refill may take up to 3 business days.

## 2020-01-15 ENCOUNTER — Other Ambulatory Visit: Payer: Self-pay

## 2020-01-15 MED ORDER — ROSUVASTATIN CALCIUM 10 MG PO TABS: 10.0000 mg | ORAL_TABLET | Freq: Every day | ORAL | 3 refills | Status: DC

## 2020-01-15 MED ORDER — LEVOTHYROXINE SODIUM 50 MCG PO TABS: 50.0000 ug | ORAL_TABLET | Freq: Every day | ORAL | 3 refills | Status: DC

## 2020-01-15 NOTE — Telephone Encounter (Signed)
Rx refill has been sent in  

## 2020-01-16 NOTE — Telephone Encounter (Signed)
1.Medication Requested:  naproxen (NAPROSYN) 500 MG tablet  traMADol (ULTRAM) 50 MG tablet  2. Pharmacy (Name, Street, [place]):[place] (SE), [place] - [address]  3. On Med List: Yes   4. Last Visit with PCP: 4.1.2021   5. Next visit date with PCP: N/a   Agent: Please be advised that RX refills may take up to 3 business days. We ask that you follow-up with your pharmacy.

## 2020-01-17 ENCOUNTER — Telehealth: Payer: Self-pay | Admitting: Family Medicine

## 2020-01-17 MED ORDER — NAPROXEN 500 MG PO TABS
500.0000 mg | ORAL_TABLET | Freq: Two times a day (BID) | ORAL | 2 refills | Status: DC | PRN
Start: 2020-01-17 — End: 2020-04-24

## 2020-01-17 NOTE — Telephone Encounter (Signed)
naproxyn done erx

## 2020-01-17 NOTE — Telephone Encounter (Signed)
Pt informed rx has been sent.  

## 2020-01-17 NOTE — Telephone Encounter (Signed)
Patient called requesting a refill of his HYDROcodone-acetaminophen (NORCO/VICODIN) 5-325 MG tablet sent to [facility] on [address].

## 2020-01-18 ENCOUNTER — Ambulatory Visit
Admission: RE | Admit: 2020-01-18 | Discharge: 2020-01-18 | Disposition: A | Discharge status: Home / Self Care | Payer: 59 | Source: Ambulatory Visit | Attending: Family Medicine | Admitting: Family Medicine

## 2020-01-18 DIAGNOSIS — M25552 Pain in left hip: Secondary | ICD-10-CM

## 2020-01-18 DIAGNOSIS — M25452 Effusion, left hip: Secondary | ICD-10-CM

## 2020-01-20 ENCOUNTER — Other Ambulatory Visit: Payer: Self-pay | Admitting: Family Medicine

## 2020-01-20 DIAGNOSIS — M25452 Effusion, left hip: Secondary | ICD-10-CM

## 2020-01-20 DIAGNOSIS — M25552 Pain in left hip: Secondary | ICD-10-CM

## 2020-01-20 MED ORDER — HYDROCODONE-ACETAMINOPHEN 5-325 MG PO TABS: 1.0000 | ORAL_TABLET | Freq: Three times a day (TID) | ORAL | 0 refills | Status: DC | PRN

## 2020-01-20 NOTE — Telephone Encounter (Signed)
Hydrocodone refilled.  

## 2020-01-21 ENCOUNTER — Ambulatory Visit: Payer: 59 | Admitting: Internal Medicine

## 2020-01-22 ENCOUNTER — Ambulatory Visit: Payer: 59 | Admitting: Family Medicine

## 2020-01-23 ENCOUNTER — Ambulatory Visit: Payer: 59 | Admitting: Internal Medicine

## 2020-01-28 ENCOUNTER — Telehealth: Payer: Self-pay | Admitting: Family Medicine

## 2020-01-28 ENCOUNTER — Telehealth: Payer: Self-pay

## 2020-01-28 MED ORDER — HYDROCODONE-ACETAMINOPHEN 5-325 MG PO TABS: 1.0000 | ORAL_TABLET | Freq: Three times a day (TID) | ORAL | 0 refills | Status: DC | PRN

## 2020-01-28 NOTE — Telephone Encounter (Signed)
Patient called requesting a refill on his HYDROcodone-acetaminophen (NORCO/VICODIN) 5-325 MG tablet. He is still in a lot of pain but has his MRI scheduled for June 24th.

## 2020-01-28 NOTE — Telephone Encounter (Signed)
1.Medication Requested:triamterene-hydrochlorothiazide (MAXZIDE) 75-50 MG tablet  2. Pharmacy (Name, Street, [place]):[place] (SE), [place] - [address]  3. On Med List: Yes   4. Last Visit with PCP: 4.1.21   5. Next visit date with PCP: n/a   Agent: Please be advised that RX refills may take up to 3 business days. We ask that you follow-up with your pharmacy.

## 2020-01-28 NOTE — Telephone Encounter (Signed)
Hydrocodone refilled.  

## 2020-01-28 NOTE — Telephone Encounter (Signed)
Please advise.  You just refilled this medication on 01/20/20.

## 2020-01-29 MED ORDER — TRIAMTERENE-HCTZ 75-50 MG PO TABS: 1.0000 | ORAL_TABLET | Freq: Every day | ORAL | 2 refills | Status: DC

## 2020-01-29 NOTE — Telephone Encounter (Signed)
Reviewed chart pt is up-to-date sent refills to. Pof../lmb 

## 2020-02-05 ENCOUNTER — Telehealth: Payer: Self-pay | Admitting: Family Medicine

## 2020-02-05 ENCOUNTER — Telehealth: Payer: Self-pay | Admitting: Internal Medicine

## 2020-02-05 NOTE — Telephone Encounter (Signed)
Patient called requesting a refill on HYDROcodone-acetaminophen (NORCO/VICODIN) 5-325 MG tablet to be sent to [facility] on [address].

## 2020-02-05 NOTE — Telephone Encounter (Signed)
    1.Medication Requested:Vitamin D, Ergocalciferol, (DRISDOL) 1.25 MG (50000 UNIT) CAPS capsule with more than 1 refill  2. Pharmacy (Name, Street, [place]):[place] (SE),  - [address]  3. On Med List: yes  4. Last Visit with PCP: 11/28/19  5. Next visit date with PCP:   Agent: Please be advised that RX refills may take up to 3 business days. We ask that you follow-up with your pharmacy.

## 2020-02-06 MED ORDER — HYDROCODONE-ACETAMINOPHEN 5-325 MG PO TABS: 1.0000 | ORAL_TABLET | Freq: Three times a day (TID) | ORAL | 0 refills | Status: DC | PRN

## 2020-02-06 NOTE — Telephone Encounter (Signed)
Refilled

## 2020-02-07 NOTE — Telephone Encounter (Signed)
Spoke with pt and informed to start OTC vit D at 2000 once a day. Pt understood and has no questions or concerns at this time.

## 2020-02-14 ENCOUNTER — Telehealth: Payer: Self-pay | Admitting: Family Medicine

## 2020-02-14 MED ORDER — HYDROCODONE-ACETAMINOPHEN 5-325 MG PO TABS: 1.0000 | ORAL_TABLET | Freq: Three times a day (TID) | ORAL | 0 refills | Status: DC | PRN

## 2020-02-14 NOTE — Telephone Encounter (Signed)
Patient called asking for a refill on his HYDROcodone-acetaminophen (NORCO/VICODIN) 5-325 MG tablet to be sent to [facility] on [address].  He asked if the quantity could be increased so that he does not have to call as often to get it refilled.

## 2020-02-14 NOTE — Telephone Encounter (Signed)
Medication refilled

## 2020-02-20 ENCOUNTER — Ambulatory Visit
Admission: RE | Admit: 2020-02-20 | Discharge: 2020-02-20 | Disposition: A | Discharge status: Home / Self Care | Payer: 59 | Source: Ambulatory Visit | Attending: Family Medicine | Admitting: Family Medicine

## 2020-02-20 DIAGNOSIS — M25452 Effusion, left hip: Secondary | ICD-10-CM

## 2020-02-20 DIAGNOSIS — M25552 Pain in left hip: Secondary | ICD-10-CM

## 2020-02-20 IMAGING — CR DG ORBITS FOR FOREIGN BODY
2 series · 2 of 2 positions shown · non-contrast
Comparison: None.

CLINICAL DATA: Metal working/exposure; clearance prior to MRI

EXAM:
ORBITS FOR FOREIGN BODY - 2 VIEW

[w orbit pa (1 of 2)]
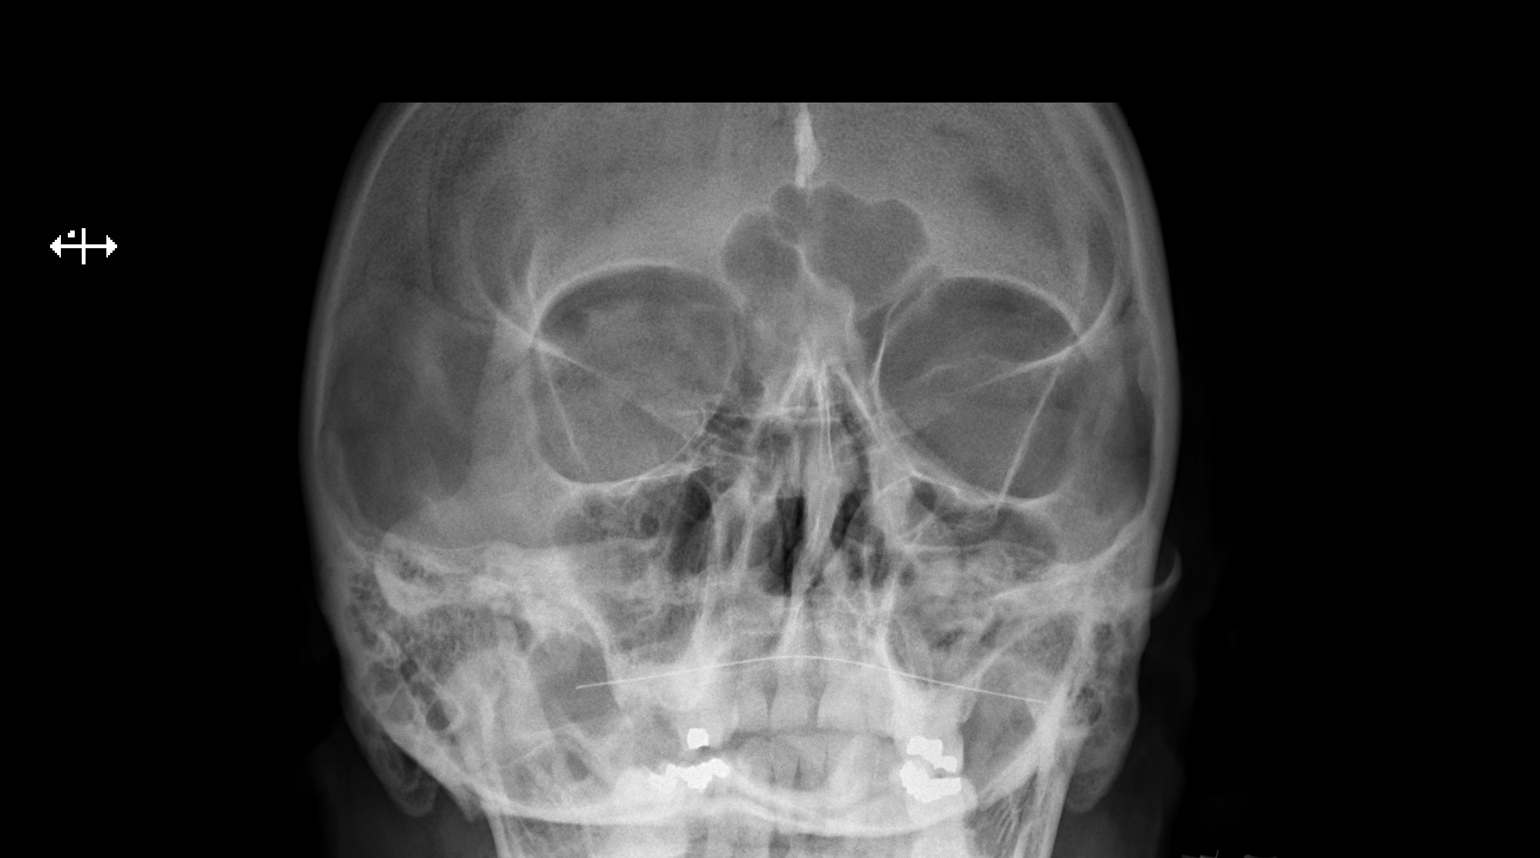

[w orbit pa (2 of 2)]
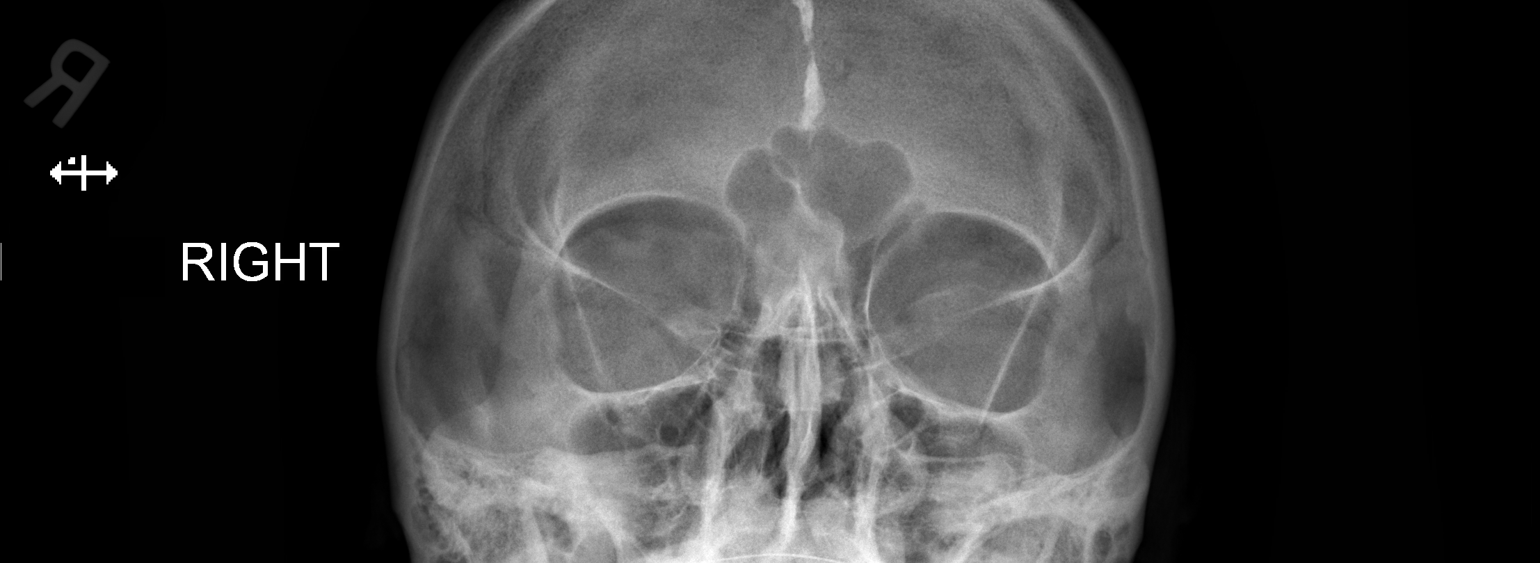

[2 of 2 positions shown; findings below may reference images not displayed]

FINDINGS: There is no evidence of metallic foreign body within the orbits. No
significant bone abnormality identified.
IMPRESSION: No evidence of metallic foreign body within the orbits.

## 2020-02-20 IMAGING — MR MR HIP*L* W/O CM
4 of 5 series · 27 of 40 positions shown · non-contrast
Comparison: Hip radiographs [DATE] and [DATE].

CLINICAL DATA: Left hip pain since falling 1 year ago. No previous
relevant surgery. Suspected femoral head avascular necrosis.

EXAM:
MR OF THE LEFT HIP WITHOUT CONTRAST
TECHNIQUE: Multiplanar, multisequence MR imaging was performed. No intravenous
contrast was administered.

[Series 5: T1 · coronal · 4.0mm · 0.88mm/px · 7 of 18 slices shown]
[im 1/18]
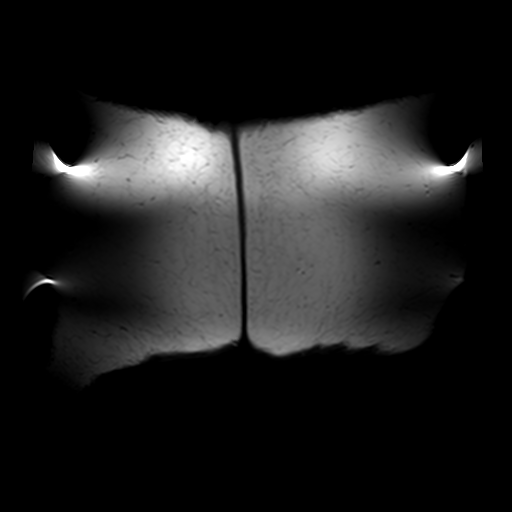
[im 3/18]
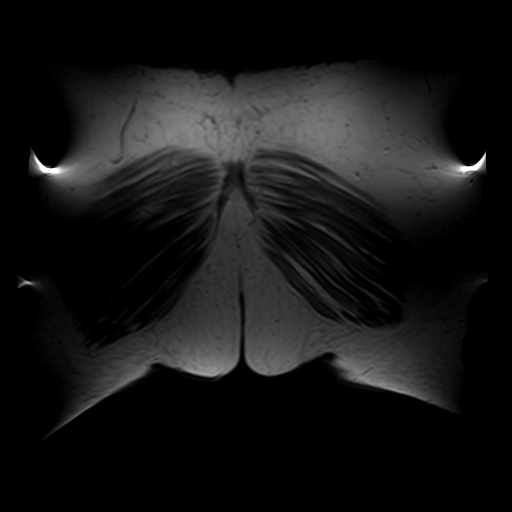
[im 6/18]
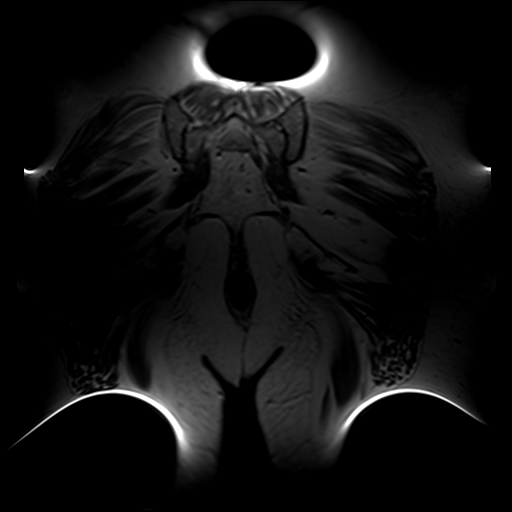
[im 9/18]
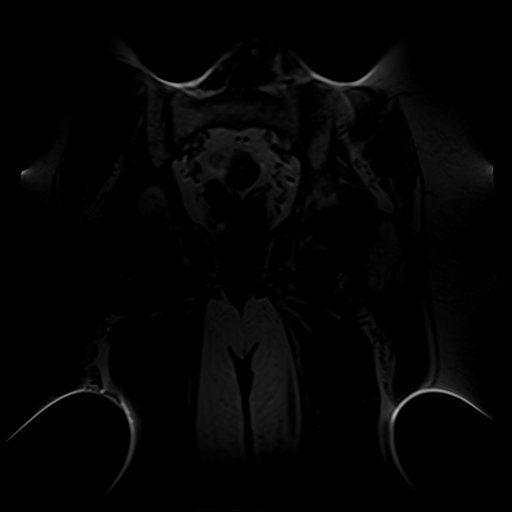
[im 12/18]
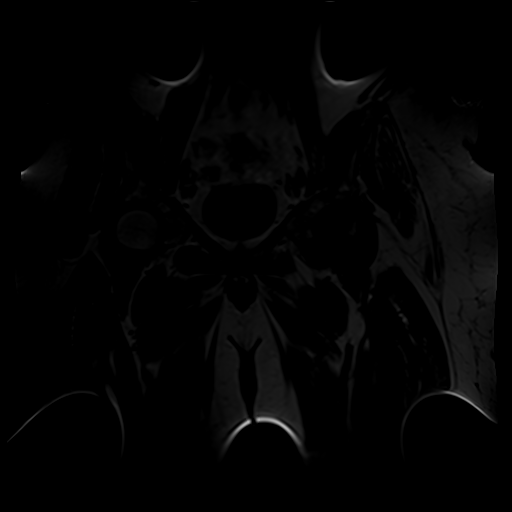
[im 15/18]
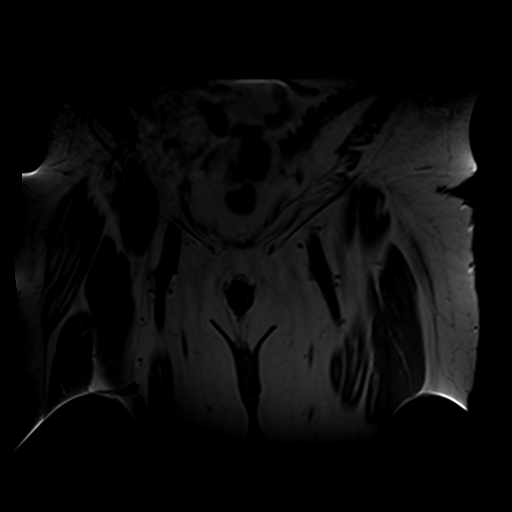
[im 18/18]
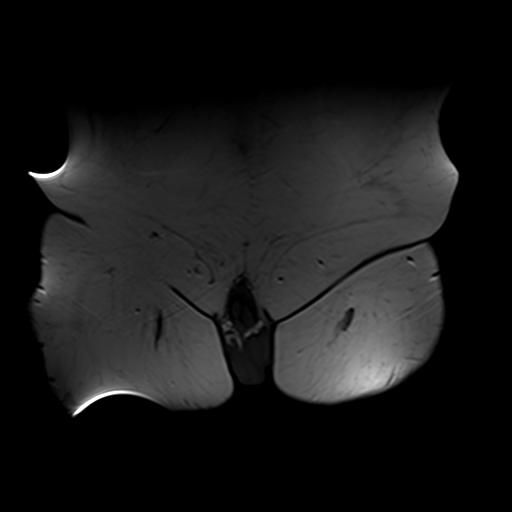

[Series 6: T2 fat-sat · coronal · 4.0mm · 0.88mm/px · 9 of 24 slices shown]
[im 1/24]
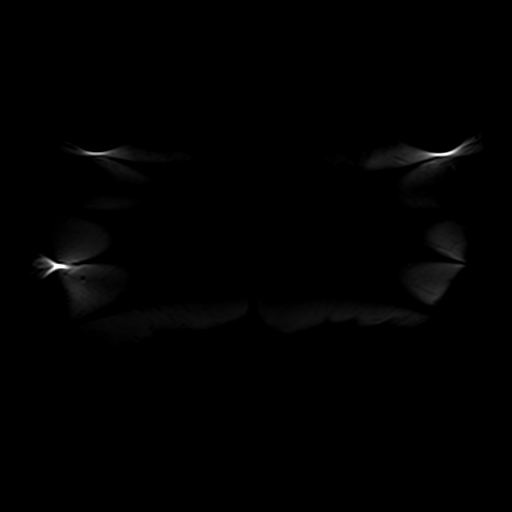
[im 3/24]
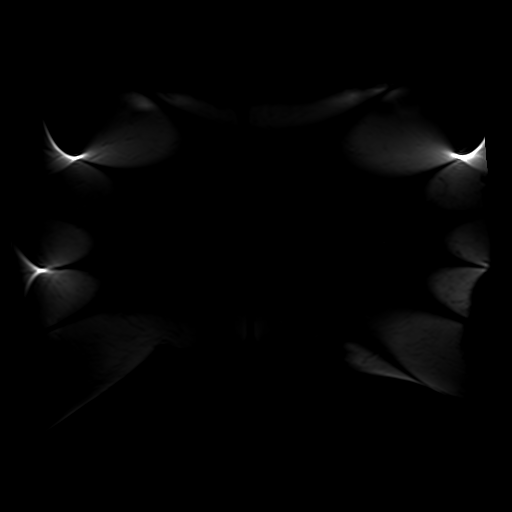
[im 6/24]
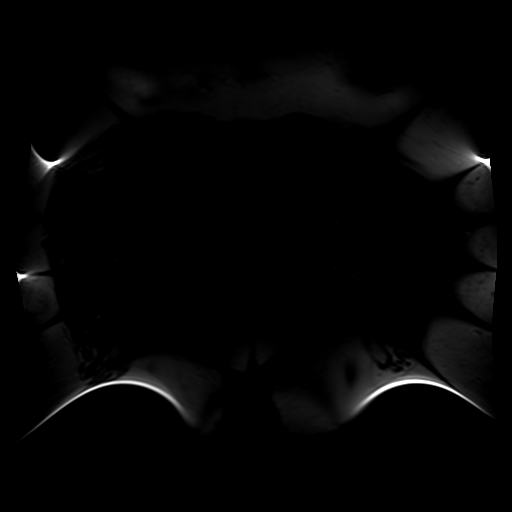
[im 9/24]
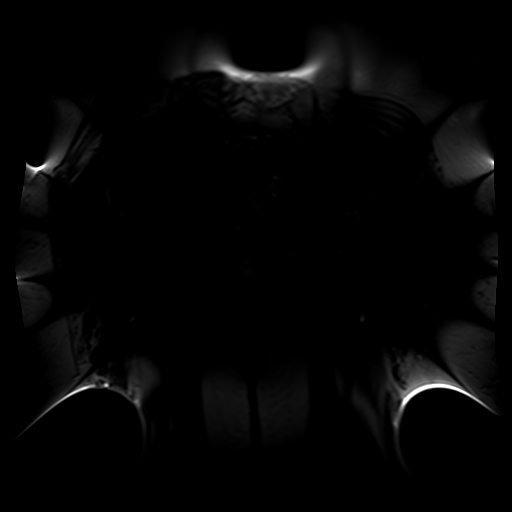
[im 12/24]
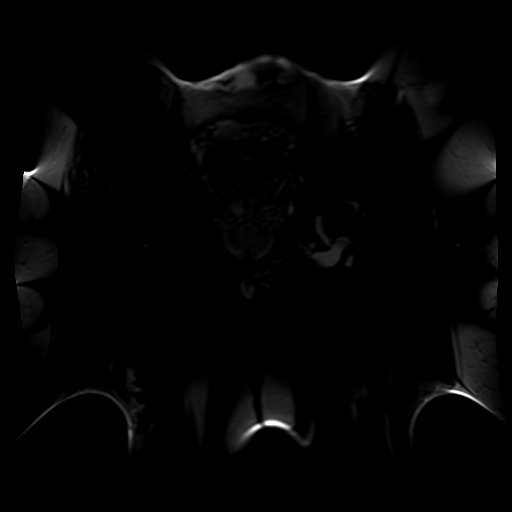
[im 15/24]
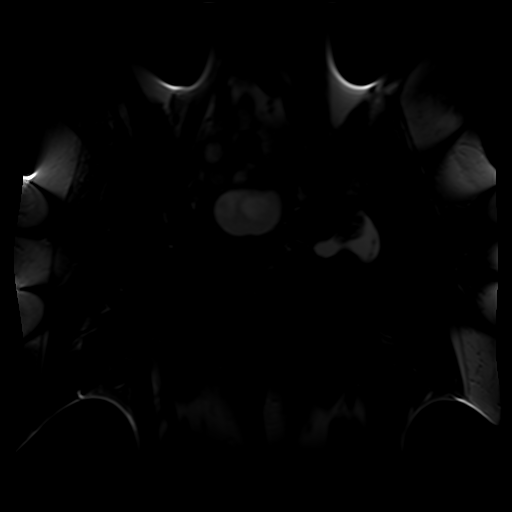
[im 18/24]
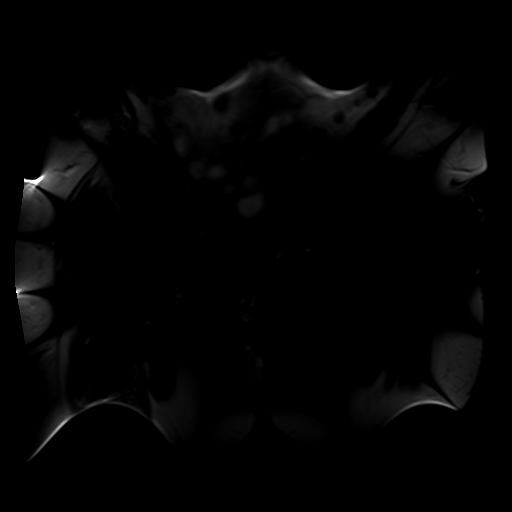
[im 21/24]
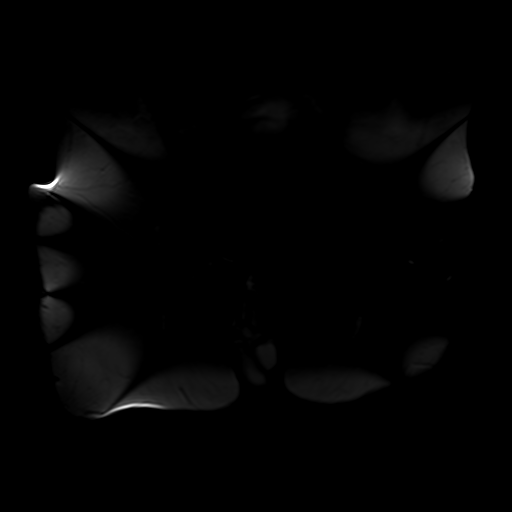
[im 24/24]
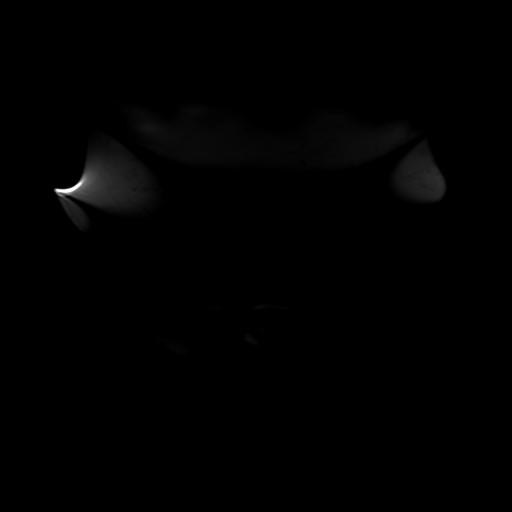

[Series 7: PD fat-sat · sagittal · 4.0mm · 0.70mm/px · 8 of 22 slices shown (1 of 2)]
[im 1/22]
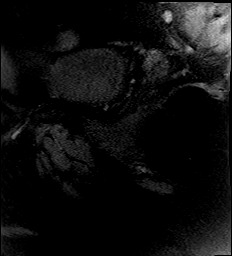
[im 4/22]
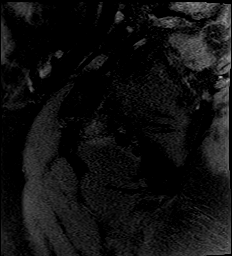
[im 7/22]
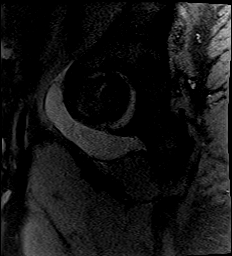
[im 10/22]
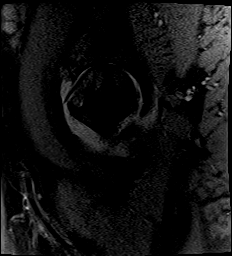
[im 13/22]
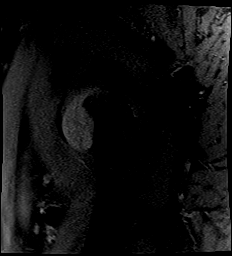
[im 16/22]
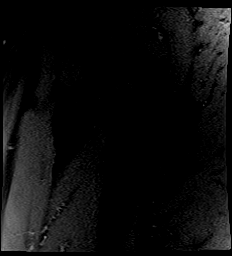
[im 19/22]
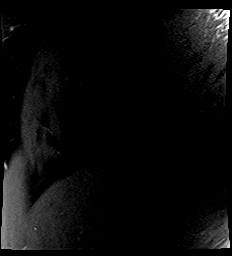
[im 22/22]
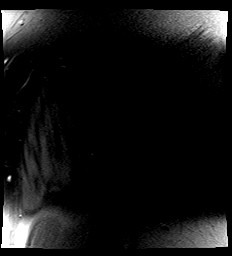

[Series 8: PD fat-sat · coronal · 4.0mm · 0.70mm/px · 3 of 19 slices shown (2 of 2)]
[im 4/19]
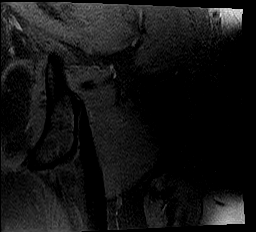
[im 10/19]
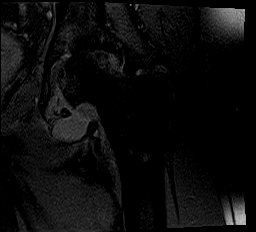
[im 16/19]
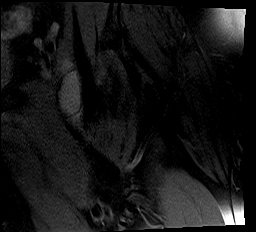

[27 of 40 positions shown; findings below may reference images not displayed]

FINDINGS: Bones: Suspected chronic left femoral head avascular necrosis with
subchondral collapse and marrow edema throughout the left femoral
head and neck. There are moderate secondary degenerative changes of
the left hip joint. No evidence of right femoral head avascular
necrosis. The visualized bony pelvis appears normal. The visualized
sacroiliac joints and symphysis pubis appear normal.

Articular cartilage and labrum

Articular cartilage: Moderate secondary degenerative changes at the
left hip with diffuse chondral thinning and subchondral edema in the
acetabulum and femoral head.

Labrum: Diffuse labral degeneration.

Joint or bursal effusion

Joint effusion: Moderate to large left hip joint effusion. No
significant right hip joint effusion.

Bursae: No focal periarticular fluid collection.

Muscles and tendons

Muscles and tendons: The visualized gluteus, hamstring and iliopsoas
tendons appear normal. The piriformis muscles appear symmetric.

Other findings

Miscellaneous: The visualized internal pelvic contents appear
unremarkable.
IMPRESSION: 1. Suspected chronic left femoral head avascular necrosis with
subchondral collapse and marrow edema throughout the left femoral
head and neck.
2. Moderate secondary degenerative changes of the left hip with a
moderate to large joint effusion.
3. No evidence of right femoral head avascular necrosis or acute
osseous findings.

## 2020-02-21 ENCOUNTER — Telehealth: Payer: Self-pay | Admitting: Family Medicine

## 2020-02-21 NOTE — Telephone Encounter (Signed)
Wants refill on pain meds, hydrocodone. [facility] on [address].  Pt had MRI and is sore from the positioning, also has to work.  I informed patient we may not respond until Monday, pt understood.

## 2020-02-24 ENCOUNTER — Telehealth: Payer: Self-pay | Admitting: Family Medicine

## 2020-02-24 MED ORDER — HYDROCODONE-ACETAMINOPHEN 5-325 MG PO TABS: 1.0000 | ORAL_TABLET | Freq: Three times a day (TID) | ORAL | 0 refills | Status: DC | PRN

## 2020-02-24 NOTE — Telephone Encounter (Signed)
Pt informed refill sent to pharmacy.

## 2020-02-24 NOTE — Telephone Encounter (Signed)
Called pt and relayed his L hip MRI results.  Pt will return call to schedule a f/u appt w/ Dr. Georgina Snell.

## 2020-02-24 NOTE — Progress Notes (Signed)
MRI hip shows avascular necrosis that appears chronic and causing the hip joint itself to fail.  This likely will need total hip replacement.  Return to clinic in person to go over the results in person to discuss treatment plan and options.

## 2020-02-24 NOTE — Telephone Encounter (Signed)
Hydrocodone sent to pharmacy

## 2020-02-24 NOTE — Telephone Encounter (Signed)
Pt calling for MRI results, done Wednesday.

## 2020-02-26 ENCOUNTER — Encounter: Payer: Self-pay | Admitting: Family Medicine

## 2020-02-26 ENCOUNTER — Other Ambulatory Visit: Payer: Self-pay

## 2020-02-26 ENCOUNTER — Ambulatory Visit (INDEPENDENT_AMBULATORY_CARE_PROVIDER_SITE_OTHER): Payer: 59 | Admitting: Family Medicine

## 2020-02-26 VITALS — BP 150/98 | HR 77 | Ht 69.0 in | Wt 295.4 lb

## 2020-02-26 DIAGNOSIS — M25452 Effusion, left hip: Secondary | ICD-10-CM | POA: Diagnosis not present

## 2020-02-26 DIAGNOSIS — M25552 Pain in left hip: Secondary | ICD-10-CM

## 2020-02-26 NOTE — Patient Instructions (Signed)
Thank you for coming in today. Plan for orthopedic surgery evaluation.  Recheck with me as needed. I anticipate a hip replacement.     Total Hip Replacement  Total hip replacement is a surgery to remove damaged bone in your hip joint and replace it with an artificial (prosthetic) hip joint. The hip is a ball-and-socket type of joint. It has two main parts. The ball part of the joint (femoral head) is the top of the thighbone (femur). The socket part of the joint is a large, hollow area on the outer side of your pelvis (acetabulum) where the femur and pelvis meet. During total hip replacement, one or both parts of the hip joint are replaced, depending on the type of joint damage that you have. The purpose of this surgery is to reduce pain and improve your hip function. Tell a health care provider about:  Any allergies you have.  All medicines you are taking, including vitamins, herbs, eye drops, creams, and over-the-counter medicines.  Any problems you or family members have had with anesthetic medicines.  Any blood disorders you have.  Any surgeries you have had.  Any medical conditions you have.  Whether you are pregnant or may be pregnant. What are the risks? Generally, this is a safe procedure. However, problems may occur, including:  Infection.  Bleeding.  Allergic reactions to medicines.  Damage to nerves or other structures.  Dislocation of the prosthetic joint (prosthesis).  Loosening of the prosthesis.  Fracture of the bone.  A blood clot, which can break loose and travel to your lungs (pulmonary embolus).  Compartment syndrome.  Deep vein thrombosis. What happens before the procedure? Staying hydrated Follow instructions from your health care provider about hydration, which may include:  Up to 2 hours before the procedure - you may continue to drink clear liquids, such as water, clear fruit juice, black coffee, and plain tea. Eating and drinking  restrictions  Follow instructions from your health care provider about eating and drinking, which may include: ? 8 hours before the procedure - stop eating heavy meals or foods such as meat, fried foods, or fatty foods. ? 6 hours before the procedure - stop eating light meals or foods, such as toast or cereal. ? 6 hours before the procedure - stop drinking milk or drinks that contain milk. ? 2 hours before the procedure - stop drinking clear liquids. Medicines  Ask your health care provider about: ? Changing or stopping your regular medicines. This is especially important if you are taking diabetes medicines or blood thinners. ? Taking medicines such as aspirin and ibuprofen. These medicines can thin your blood. Do not take these medicines unless your health care provider tells you to take them. ? Taking over-the-counter medicines, vitamins, herbs, or supplements. General instructions  You may have a physical exam.  You may have testing, such as: ? X-rays or an MRI. ? Blood or urine tests.  Plan to have someone take you home from the hospital or clinic.  Plan to have a responsible adult care for you for at least 24 hours after you leave the hospital or clinic. This is important.  Prepare your home so you can be safe and have easy access to what you need.  Keep your body and teeth clean. Germs from anywhere in your body can travel to your new joint and infect it. Tell your health care provider if you: ? Plan to have dental care and routine cleanings. ? Develop any skin infections.  Avoid shaving your legs just before surgery. If any shaving is needed, it will be done in the hospital.  Ask your health care provider how your surgical site will be marked or identified. What happens during the procedure?  To lower your risk of infection: ? Your health care team will wash or sanitize their hands. ? Hair may be removed from the surgical area. ? Your skin will be washed with  soap.  An IV will be inserted into one of your veins.  You will be given one or more of the following: ? A medicine to help you relax (sedative). ? A medicine to make you fall asleep (general anesthetic). ? A medicine that is injected into your spine to numb the area below and slightly above the injection site (spinal anesthetic).  An incision will be made so the surgeon can see the bones and tissue in your hip. The location of the incision will depend on the approach used by the surgeon: ? Posterior approach. The incision will be at the back of the hip. ? Anterior approach. The incision will be at the front of the hip.  Then, your surgeon will: ? Use his or her hands to move your hip out of position (dislocate it). ? Cut and remove damaged pieces of bone and cartilage. ? Insert a prosthetic ball and socket into the hip joint. ? Secure the ball and socket in the hip joint. ? Do an X-ray of the hip joint to confirm proper placement. ? Place a drain to remove excess fluid, if needed. ? Close the incision and apply a bandage (dressing) over the surgical site. The procedure may vary among health care providers and hospitals. What happens after the procedure?  Your blood pressure, heart rate, breathing rate, and blood oxygen level will be monitored until the medicines you were given have worn off.  Your neurovascular status will be monitored.  You will be given pain medicine.  You may have to wear compression stockings. These help to prevent blood clots and reduce swelling in your legs. You may also be given a blood-thinning (anticoagulant) medicine.  You will receive physical therapy until your health care provider feels it is safe for you to go home. You may need to use a walker or crutches.  If you had the posterior approach, you may have to use a wedge (hip abduction) pillow when you are in bed. ? This pillow will protect your hip from dislocation by keeping it straight and will  prevent your legs from turning inward or away from your body. Summary  Total hip replacement is a surgery to remove damaged bone in your hip joint and replace it with an artificial (prosthetic) hip joint.  Before the procedure, follow instructions from your health care provider about eating and drinking.  Plan to have someone take you home from the hospital or clinic.  After the surgery, you may have to wear compression stockings. These help to prevent blood clots and reduce swelling in your legs. This information is not intended to replace advice given to you by your health care provider. Make sure you discuss any questions you have with your health care provider. Document Revised: 12/24/2018 Document Reviewed: 05/10/2017 Elsevier Patient Education  2020 [employer].

## 2020-02-26 NOTE — Progress Notes (Signed)
I, Michael Khan, LAT, ATC, am serving as scribe for Dr. Lynne Khan.  Michael Khan is a 64 y.o. male who presents to [place] at [facility] today for f/u of L hip and groin pain that radiates into his L ant thigh.  He is also here to review his L hip MRI.  He was last seen by Dr. Georgina Snell on 12/05/19 and had a L hip injection.  Since his last visit, pt reports no change in his L hip/groin and also notes some newer pain in his L ant knee.  He is also having trouble w/ sleeping.  He has been intermittently using a cane for ambulation but does not have it w/ him today.  Diagnostic imaging: L hip MRI- 02/20/20; L hip XR- 12/05/19   Pertinent review of systems: No fevers or chills  Relevant historical information: Hypertension.  History of Guillain-Barr syndrome.   Exam:  BP (!) 150/98 (BP Location: Right Arm, Patient Position: Sitting, Cuff Size: Large)    Pulse 77    Ht 5\' 9"  (1.753 m)    Wt 295 lb 6.4 oz (134 kg)    SpO2 97%    BMI 43.62 kg/m  General: Well Developed, well nourished, and in no acute distress.   MSK: Left hip normal-appearing decreased motion.  Antalgic gait.    Lab and Radiology Results  EXAM: MR OF THE LEFT HIP WITHOUT CONTRAST  TECHNIQUE: Multiplanar, multisequence MR imaging was performed. No intravenous contrast was administered.  COMPARISON:  Hip radiographs 12/05/2019 and 07/24/2019.  FINDINGS: Bones: Suspected chronic left femoral head avascular necrosis with subchondral collapse and marrow edema throughout the left femoral head and neck. There are moderate secondary degenerative changes of the left hip joint. No evidence of right femoral head avascular necrosis. The visualized bony pelvis appears normal. The visualized sacroiliac joints and symphysis pubis appear normal.  Articular cartilage and labrum  Articular cartilage: Moderate secondary degenerative changes at the left hip with diffuse chondral thinning and subchondral  edema in the acetabulum and femoral head.  Labrum: Diffuse labral degeneration.  Joint or bursal effusion  Joint effusion: Moderate to large left hip joint effusion. No significant right hip joint effusion.  Bursae: No focal periarticular fluid collection.  Muscles and tendons  Muscles and tendons: The visualized gluteus, hamstring and iliopsoas tendons appear normal. The piriformis muscles appear symmetric.  Other findings  Miscellaneous: The visualized internal pelvic contents appear unremarkable.  IMPRESSION: 1. Suspected chronic left femoral head avascular necrosis with subchondral collapse and marrow edema throughout the left femoral head and neck. 2. Moderate secondary degenerative changes of the left hip with a moderate to large joint effusion. 3. No evidence of right femoral head avascular necrosis or acute osseous findings.   Electronically Signed   By: Richardean Sale M.D.   On: 02/21/2020 20:44 I, Michael Khan, personally (independently) visualized and performed the interpretation of the images attached in this note.     Assessment and Plan: 64 y.o. male with left hip pain due to AVN DJD and effusion.  Failing conservative management.  At this point only real definitive option is a total hip replacement.  Refer to orthopedic surgery for surgical consultation.  His obesity may be a factor here.  Recheck back as needed.   PDMP not reviewed this encounter. Orders Placed This Encounter  Procedures   Ambulatory referral to Orthopedic Surgery    Referral Priority:   Routine    Referral Type:   Surgical  Referral Reason:   Specialty Services Required    Requested Specialty:   Orthopedic Surgery    Number of Visits Requested:   1   No orders of the defined types were placed in this encounter.    Discussed warning signs or symptoms. Please see discharge instructions. Patient expresses understanding.   The above documentation has been reviewed  and is accurate and complete Michael Khan, M.D.

## 2020-03-04 ENCOUNTER — Encounter: Payer: Self-pay | Admitting: Orthopaedic Surgery

## 2020-03-04 ENCOUNTER — Ambulatory Visit (INDEPENDENT_AMBULATORY_CARE_PROVIDER_SITE_OTHER): Payer: 59 | Admitting: Orthopaedic Surgery

## 2020-03-04 VITALS — Ht 68.0 in | Wt 287.8 lb

## 2020-03-04 DIAGNOSIS — M87052 Idiopathic aseptic necrosis of left femur: Secondary | ICD-10-CM

## 2020-03-04 NOTE — Progress Notes (Signed)
Office Visit Note   Patient: Michael Khan           Date of Birth: 05/12/1956           MRN: [id number] Visit Date: 03/04/2020              Requested by: Michael Hams, MD [place],  [place] [address] PCP: Michael Borg, MD   Assessment & Plan: Visit Diagnoses:  1. Avascular necrosis of bone of left hip (HCC)     Plan: My impression is left hip AVN with early femoral head collapse.  We reviewed the imaging studies at length today and given the lack of relief from conservative treatment I have recommended a total hip replacement in the future.  Unfortunately his current BMI is greater than 40 pounds to be a surgical candidate.  He understands the potential grave consequences of increased BMI and total joint replacements.  We will see him back once he has achieved his target weight to schedule hip replacement surgery.  Follow-Up Instructions: Return if symptoms worsen or fail to improve.   Orders:  No orders of the defined types were placed in this encounter.  No orders of the defined types were placed in this encounter.     Procedures: No procedures performed   Clinical Data: No additional findings.   Subjective: Chief Complaint  Patient presents with  . Left Hip - Pain    Michael Khan is a 64 year old gentleman comes in for evaluation of left hip AVN.  He is a referral from PCP.  He denies any risk factors for AVN.  He has had a previous cortisone injection which gave him 1 week of relief.  Denies any numbness and tingling or radiculopathy.  The pain is constant and his groin hip and radiates to the knee.  He does work from his desk.   Review of Systems  Constitutional: Negative.   All other systems reviewed and are negative.    Objective: Vital Signs: Ht 5\' 8"  (1.727 m)   Wt 287 lb 12.8 oz (130.5 kg)   BMI 43.76 kg/m   Physical Exam Vitals and nursing note reviewed.  Constitutional:      Appearance: He is well-developed.  HENT:     Head:  Normocephalic and atraumatic.  Eyes:     Pupils: Pupils are equal, round, and reactive to light.  Pulmonary:     Effort: Pulmonary effort is normal.  Abdominal:     Palpations: Abdomen is soft.  Musculoskeletal:        General: Normal range of motion.     Cervical back: Neck supple.  Skin:    General: Skin is warm.  Neurological:     Mental Status: He is alert and oriented to person, place, and time.  Psychiatric:        Behavior: Behavior normal.        Thought Content: Thought content normal.        Judgment: Judgment normal.     Ortho Exam Significant pain with internal and external rotation.  Positive Stinchfield sign.  Lateral hip is nontender. Specialty Comments:  No specialty comments available.  Imaging: No results found.   PMFS History: Patient Active Problem List   Diagnosis Date Noted  . Left hip pain 11/28/2019  . Hypothyroidism 07/24/2019  . Umbilical hernia without obstruction and without gangrene 07/24/2019  . Wart 07/24/2019  . Left groin pain 07/24/2019  . Low back pain 01/14/2019  . Acute  sinus infection 05/16/2018  . Hypertension 01/27/2018  . Leg swelling 01/24/2018  . Hyperglycemia 11/08/2017  . Corns/callosities 11/08/2017  . HLD (hyperlipidemia)   . Muscle cramps 01/30/2017  . Hx of Guillain-Barre syndrome 06/15/2016  . Encounter for routine adult medical exam with abnormal findings 02/17/2016  . Rash and nonspecific skin eruption 02/17/2016  . Allergic rhinitis 04/10/2015   Past Medical History:  Diagnosis Date  . Guillain Barr syndrome ([place])    When he was 3, he received a flu shot caused him to develop Guillain Barre Syndrome  . Hyperlipemia   . Hypothyroidism 07/24/2019  . Measles     Family History  Problem Relation Age of Onset  . Liver cancer Mother   . Healthy Father   . Colon cancer Neg Hx     Past Surgical History:  Procedure Laterality Date  . ANKLE RECONSTRUCTION    . CHOLECYSTECTOMY    . NOSE SURGERY    .  WRIST RECONSTRUCTION     Social History   Occupational History  . Occupation: Security  Tobacco Use  . Smoking status: Never Smoker  . Smokeless tobacco: Never Used  Substance and Sexual Activity  . Alcohol use: No  . Drug use: No  . Sexual activity: Yes

## 2020-03-06 ENCOUNTER — Telehealth: Payer: Self-pay | Admitting: Family Medicine

## 2020-03-06 MED ORDER — HYDROCODONE-ACETAMINOPHEN 5-325 MG PO TABS: 1.0000 | ORAL_TABLET | Freq: Three times a day (TID) | ORAL | 0 refills | Status: DC | PRN

## 2020-03-06 NOTE — Telephone Encounter (Signed)
Left message for patient regarding rx.

## 2020-03-06 NOTE — Telephone Encounter (Signed)
Patient called requesting a refill on his HYDROcodone-acetaminophen (NORCO/VICODIN) 5-325 MG tablet.  Please advise.

## 2020-03-06 NOTE — Telephone Encounter (Signed)
Refilled

## 2020-03-20 ENCOUNTER — Telehealth: Payer: Self-pay | Admitting: Family Medicine

## 2020-03-20 MED ORDER — HYDROCODONE-ACETAMINOPHEN 5-325 MG PO TABS: 1.0000 | ORAL_TABLET | Freq: Three times a day (TID) | ORAL | 0 refills | Status: DC | PRN

## 2020-03-20 NOTE — Telephone Encounter (Signed)
Medication refilled

## 2020-03-20 NOTE — Telephone Encounter (Signed)
Patient called requesting a refill on his HYDROcodone-acetaminophen (NORCO/VICODIN) 5-325 MG tablet.  Please advise.

## 2020-03-30 ENCOUNTER — Telehealth: Payer: Self-pay | Admitting: *Deleted

## 2020-03-30 NOTE — Telephone Encounter (Signed)
Pt called requesting a refill for Hydrocodone.

## 2020-03-31 MED ORDER — HYDROCODONE-ACETAMINOPHEN 5-325 MG PO TABS: 1.0000 | ORAL_TABLET | Freq: Three times a day (TID) | ORAL | 0 refills | Status: DC | PRN

## 2020-03-31 NOTE — Telephone Encounter (Signed)
Refilled

## 2020-04-13 ENCOUNTER — Telehealth: Payer: Self-pay | Admitting: Family Medicine

## 2020-04-13 NOTE — Telephone Encounter (Signed)
Pt requesting refill of pain meds to [facility] on [address]. Pt informed provider out of office today.

## 2020-04-14 MED ORDER — HYDROCODONE-ACETAMINOPHEN 5-325 MG PO TABS: 1.0000 | ORAL_TABLET | Freq: Three times a day (TID) | ORAL | 0 refills | Status: DC | PRN

## 2020-04-14 NOTE — Telephone Encounter (Signed)
Called Michael Khan and relayed Dr. Clovis Riley advice.  Michael Khan states that he will call back to schedule a f/u visit w/ Dr. Georgina Snell once he knows his work schedule.

## 2020-04-14 NOTE — Telephone Encounter (Signed)
I have refilled this medicine however I am getting need to transfer to pain management as this is becoming chronic pain management.  Recommend you schedule follow-up appoint with me so we can discuss the plan.

## 2020-04-24 ENCOUNTER — Other Ambulatory Visit: Payer: Self-pay

## 2020-04-24 ENCOUNTER — Telehealth: Payer: Self-pay | Admitting: Internal Medicine

## 2020-04-24 ENCOUNTER — Other Ambulatory Visit: Payer: 59

## 2020-04-24 DIAGNOSIS — Z20822 Contact with and (suspected) exposure to covid-19: Secondary | ICD-10-CM

## 2020-04-24 MED ORDER — NAPROXEN 500 MG PO TABS: 500.0000 mg | ORAL_TABLET | Freq: Two times a day (BID) | ORAL | 2 refills | Status: AC | PRN

## 2020-04-24 NOTE — Telephone Encounter (Signed)
Done erx 

## 2020-04-24 NOTE — Telephone Encounter (Signed)
Sent to Dr. John. 

## 2020-04-24 NOTE — Telephone Encounter (Signed)
    Patient requesting refill on naproxen (NAPROSYN) 500 MG tablet for hip pain [facility] ([place] [place] [address] Last ov 53/79/43

## 2020-04-26 LAB — NOVEL CORONAVIRUS, NAA: SARS-CoV-2, NAA: NOT DETECTED

## 2020-04-26 LAB — SARS-COV-2, NAA 2 DAY TAT

## 2020-05-12 ENCOUNTER — Telehealth: Payer: Self-pay | Admitting: Internal Medicine

## 2020-05-12 ENCOUNTER — Other Ambulatory Visit: Payer: Self-pay | Admitting: Internal Medicine

## 2020-05-12 NOTE — Telephone Encounter (Signed)
Please refill as per office routine med refill policy (all routine meds refilled for 3 mo or monthly per pt preference up to one year from last visit, then month to month grace period for 3 mo, then further med refills will have to be denied)  

## 2020-05-12 NOTE — Telephone Encounter (Signed)
    1.Medication Requested: rosuvastatin (CRESTOR) 10 MG tablet levothyroxine (SYNTHROID) 50 MCG tablet  2. Pharmacy (Name, Street, [place]):[facility] ([place]), [place] - [address]  3. On Med List: yes  4. Last Visit with PCP: 11/28/19  5. Next visit date with PCP: n/a   Agent: Please be advised that RX refills may take up to 3 business days. We ask that you follow-up with your pharmacy.

## 2020-05-13 ENCOUNTER — Other Ambulatory Visit: Payer: Self-pay

## 2020-05-14 NOTE — Telephone Encounter (Signed)
Patient calling about refill. Please call the pharmacy, or advise if the patient needs to come in.

## 2020-06-11 ENCOUNTER — Telehealth: Payer: Self-pay | Admitting: Internal Medicine

## 2020-06-11 NOTE — Telephone Encounter (Signed)
Please refill as per office routine med refill policy (all routine meds refilled for 3 mo or monthly per pt preference up to one year from last visit, then month to month grace period for 3 mo, then further med refills will have to be denied)  

## 2020-06-15 ENCOUNTER — Other Ambulatory Visit: Payer: Self-pay | Admitting: Internal Medicine

## 2020-06-15 NOTE — Telephone Encounter (Signed)
Please refill as per office routine med refill policy (all routine meds refilled for 3 mo or monthly per pt preference up to one year from last visit, then month to month grace period for 3 mo, then further med refills will have to be denied)  

## 2020-06-15 NOTE — Telephone Encounter (Signed)
Patient has made an appointment for 10/27, Requesting enough refill until his appointment.

## 2020-06-16 ENCOUNTER — Other Ambulatory Visit: Payer: Self-pay

## 2020-06-16 MED ORDER — ROSUVASTATIN CALCIUM 10 MG PO TABS: ORAL_TABLET | ORAL | 0 refills | Status: DC

## 2020-06-24 ENCOUNTER — Ambulatory Visit (INDEPENDENT_AMBULATORY_CARE_PROVIDER_SITE_OTHER): Payer: 59 | Admitting: Internal Medicine

## 2020-06-24 ENCOUNTER — Other Ambulatory Visit: Payer: Self-pay

## 2020-06-24 ENCOUNTER — Encounter: Payer: Self-pay | Admitting: Internal Medicine

## 2020-06-24 VITALS — BP 130/60 | HR 81 | Temp 98.2°F | Ht 68.0 in | Wt 291.0 lb

## 2020-06-24 DIAGNOSIS — R739 Hyperglycemia, unspecified: Secondary | ICD-10-CM

## 2020-06-24 DIAGNOSIS — M25552 Pain in left hip: Secondary | ICD-10-CM | POA: Diagnosis not present

## 2020-06-24 DIAGNOSIS — I10 Essential (primary) hypertension: Secondary | ICD-10-CM

## 2020-06-24 DIAGNOSIS — E559 Vitamin D deficiency, unspecified: Secondary | ICD-10-CM

## 2020-06-24 DIAGNOSIS — L600 Ingrowing nail: Secondary | ICD-10-CM | POA: Diagnosis not present

## 2020-06-24 DIAGNOSIS — E039 Hypothyroidism, unspecified: Secondary | ICD-10-CM

## 2020-06-24 DIAGNOSIS — E785 Hyperlipidemia, unspecified: Secondary | ICD-10-CM

## 2020-06-24 MED ORDER — ROSUVASTATIN CALCIUM 10 MG PO TABS: ORAL_TABLET | ORAL | 3 refills | Status: DC

## 2020-06-24 MED ORDER — TRIAMTERENE-HCTZ 75-50 MG PO TABS: 1.0000 | ORAL_TABLET | Freq: Every day | ORAL | 3 refills | Status: DC

## 2020-06-24 MED ORDER — LEVOTHYROXINE SODIUM 50 MCG PO TABS
50.0000 ug | ORAL_TABLET | Freq: Every day | ORAL | 3 refills | Status: DC
Start: 2020-06-24 — End: 2020-10-03

## 2020-06-24 MED ORDER — PHENTERMINE HCL 37.5 MG PO CAPS: 37.5000 mg | ORAL_CAPSULE | ORAL | 2 refills | Status: DC

## 2020-06-24 NOTE — Patient Instructions (Signed)
Please take all new medication as prescribed - the phentermine for weight loss x 3 mo (then we have to stop)  You will be contacted regarding the referral for: [facility] for the left hip  You will be contacted regarding the referral for: podiatry for the left great toe nail  Please continue all other medications as before, and refills have been done if requested.  Please have the pharmacy call with any other refills you may need.  Please continue your efforts at being more active, low cholesterol diet, and weight control.  Please keep your appointments with your specialists as you may have planned  Please make an Appointment to return in 6 months, or sooner if needed

## 2020-06-24 NOTE — Progress Notes (Signed)
Subjective:    Patient ID: Michael Khan, male    DOB: March 03, 1956, 64 y.o.   MRN: [id number]  HPI  Here to f/u; overall doing ok,  Pt denies chest pain, increasing sob or doe, wheezing, orthopnea, PND, increased LE swelling, palpitations, dizziness or syncope.  Pt denies new neurological symptoms such as new headache, or facial or extremity weakness or numbness.  Pt denies polydipsia, polyuria, or low sugar episode.  Pt states overall good compliance with meds, mostly trying to follow appropriate diet, with wt overall stable,  but little exercise however, due to chronic left hip pain and needs to lose 30 lbs to get the left hip THR surgury Dr Erlinda Hong (already lost 13 lbs) to a goal of 265, But hard to ose more due to cant exercise.  Asks for second opinion.  Has not taken pain med for 3 mo, though anti-inflammatory does help as well. BP Readings from Last 3 Encounters:  06/24/20 130/60  02/26/20 (!) 150/98  12/05/19 (!) 148/90   Wt Readings from Last 3 Encounters:  06/24/20 291 lb (132 kg)  03/04/20 287 lb 12.8 oz (130.5 kg)  02/26/20 295 lb 6.4 oz (134 kg)  Also has left great toe ingrown nail, cant bend over to get to it, but needs attention with podiatry.   Denies hyper or hypo thyroid symptoms such as voice, skin or hair change. Past Medical History:  Diagnosis Date  . Guillain Barr syndrome ([place])    When he was 52, he received a flu shot caused him to develop Guillain Barre Syndrome  . Hyperlipemia   . Hypothyroidism 07/24/2019  . Measles    Past Surgical History:  Procedure Laterality Date  . ANKLE RECONSTRUCTION    . CHOLECYSTECTOMY    . NOSE SURGERY    . WRIST RECONSTRUCTION      reports that he has never smoked. He has never used smokeless tobacco. He reports that he does not drink alcohol and does not use drugs. family history includes Healthy in his father; Liver cancer in his mother. Allergies  Allergen Reactions  . Drug Class [Haemophilus Influenzae Vaccines] Other (See  Comments)    Caused to not walk  . Lipitor [Atorvastatin Calcium]     discomfort   Current Outpatient Medications on File Prior to Visit  Medication Sig Dispense Refill  . aspirin 81 MG EC tablet Take 1 tablet (81 mg total) by mouth daily. Swallow whole. 30 tablet 12  . cyclobenzaprine (FLEXERIL) 5 MG tablet Take 1 tablet (5 mg total) by mouth 3 (three) times daily as needed for muscle spasms. 40 tablet 1  . HYDROcodone-acetaminophen (NORCO/VICODIN) 5-325 MG tablet Take 1 tablet by mouth every 8 (eight) hours as needed. 15 tablet 0  . naproxen (NAPROSYN) 500 MG tablet Take 1 tablet (500 mg total) by mouth 2 (two) times daily as needed for moderate pain. 60 tablet 2   No current facility-administered medications on file prior to visit.   Review of Systems All otherwise neg per pt     Objective:   Physical Exam BP 130/60 (BP Location: Left Arm, Patient Position: Sitting, Cuff Size: Large)   Pulse 81   Temp 98.2 F (36.8 C) (Oral)   Ht 5\' 8"  (1.727 m)   Wt 291 lb (132 kg)   SpO2 96%   BMI 44.25 kg/m  VS noted,  Constitutional: Pt appears in NAD HENT: Head: NCAT.  Right Ear: External ear normal.  Left Ear: External ear normal.  Eyes: . Pupils are equal, round, and reactive to light. Conjunctivae and EOM are normal Nose: without d/c or deformity Neck: Neck supple. Gross normal ROM Cardiovascular: Normal rate and regular rhythm.   Pulmonary/Chest: Effort normal and breath sounds without rales or wheezing.  Abd:  Soft, NT, ND, + BS, no organomegaly Neurological: Pt is alert. At baseline orientation, motor grossly intact Skin: Skin is warm. No rashes, other new lesions, no LE edema but has ingrown nail left great toe Psychiatric: Pt behavior is normal without agitation  All otherwise neg per pt Lab Results  Component Value Date   WBC 11.1 (H) 11/28/2019   HGB 14.4 11/28/2019   HCT 44.5 11/28/2019   PLT 216.0 11/28/2019   GLUCOSE 114 (H) 11/28/2019   CHOL 143 11/28/2019    TRIG 176.0 (H) 11/28/2019   HDL 38.90 (L) 11/28/2019   LDLCALC 69 11/28/2019   ALT 29 11/28/2019   AST 24 11/28/2019   NA 138 11/28/2019   K 4.3 11/28/2019   CL 102 11/28/2019   CREATININE 1.20 11/28/2019   BUN 26 (H) 11/28/2019   CO2 29 11/28/2019   TSH 6.25 (H) 11/28/2019   PSA 0.50 11/28/2019   INR 1.0 10/03/2008   HGBA1C 6.1 11/28/2019   MICROALBUR <0.7 11/28/2019      Assessment & Plan:

## 2020-06-28 ENCOUNTER — Encounter: Payer: Self-pay | Admitting: Internal Medicine

## 2020-06-28 NOTE — Assessment & Plan Note (Signed)
,  stable overall by history and exam, recent data reviewed with pt, and pt to continue medical treatment as before,  to f/u any worsening symptoms or concerns

## 2020-06-28 NOTE — Assessment & Plan Note (Signed)
For phentermine 37.5 qd to assist for short term wt loss

## 2020-06-28 NOTE — Assessment & Plan Note (Signed)
Ok for podiatry referral 

## 2020-06-28 NOTE — Assessment & Plan Note (Signed)
stable overall by history and exam, recent data reviewed with pt, and pt to continue medical treatment as before,  to f/u any worsening symptoms or concerns  

## 2020-06-28 NOTE — Assessment & Plan Note (Signed)
Cont oral repalcement

## 2020-06-28 NOTE — Assessment & Plan Note (Addendum)
C/w end stage left hip djd - for referral to [facility] for second opinion, may be able to do surgury there with higher BMI  I spent 41 minutes in preparing to see the patient by review of recent labs, imaging and procedures, obtaining and reviewing separately obtained history, communicating with the patient and family or caregiver, ordering medications, tests or procedures, and documenting clinical information in the EHR including the differential Dx, treatment, and any further evaluation and other management of left hip pain, morbid obesity, vit d def, hypothyroidism, htn, hld, hyperglycemia, ingrown toenail

## 2020-07-01 ENCOUNTER — Ambulatory Visit (INDEPENDENT_AMBULATORY_CARE_PROVIDER_SITE_OTHER): Payer: Medicare (Managed Care) | Admitting: Podiatry

## 2020-07-01 ENCOUNTER — Other Ambulatory Visit: Payer: Self-pay

## 2020-07-01 DIAGNOSIS — L600 Ingrowing nail: Secondary | ICD-10-CM

## 2020-07-01 MED ORDER — GENTAMICIN SULFATE 0.1 % EX CREA: 1.0000 "application " | TOPICAL_CREAM | Freq: Two times a day (BID) | CUTANEOUS | 1 refills | Status: AC

## 2020-07-01 NOTE — Progress Notes (Signed)
   Subjective: Patient presents today for evaluation of pain to the medial border left great toe. Patient is concerned for possible ingrown nail. Patient presents today for further treatment and evaluation.  Past Medical History:  Diagnosis Date  . Guillain Barr syndrome ([place])    When he was 29, he received a flu shot caused him to develop Guillain Barre Syndrome  . Hyperlipemia   . Hypothyroidism 07/24/2019  . Measles     Objective:  General: Well developed, nourished, in no acute distress, alert and oriented x3   Dermatology: Skin is warm, dry and supple bilateral.  Medial border left great toe appears to be erythematous with evidence of an ingrowing nail. Pain on palpation noted to the border of the nail fold. The remaining nails appear unremarkable at this time. There are no open sores, lesions.  Vascular: Dorsalis Pedis artery and Posterior Tibial artery pedal pulses palpable. No lower extremity edema noted.   Neruologic: Grossly intact via light touch bilateral.  Musculoskeletal: Muscular strength within normal limits in all groups bilateral. Normal range of motion noted to all pedal and ankle joints.   Assesement: #1 Paronychia with ingrowing nail medial border left great toe #2 Pain in toe #3 Incurvated nail  Plan of Care:  1. Patient evaluated.  2. Discussed treatment alternatives and plan of care. Explained nail avulsion procedure and post procedure course to patient. 3. Patient opted for permanent partial nail avulsion of the medial border left great toe.  4. Prior to procedure, local anesthesia infiltration utilized using 3 ml of a 50:50 mixture of 2% plain lidocaine and 0.5% plain marcaine in a normal hallux block fashion and a betadine prep performed.  5. Partial permanent nail avulsion with chemical matrixectomy performed using [id number] applications of phenol followed by alcohol flush.  6. Light dressing applied. 7.  Prescription for gentamicin cream applied 2 times  daily  8.  Return to clinic 2 weeks.  Edrick Kins, DPM [facility]  Dr. Edrick Kins, [address]                                        [place], [place] [address]                Office [phone]  Fax [phone]

## 2020-07-01 NOTE — Patient Instructions (Signed)

## 2020-07-13 ENCOUNTER — Other Ambulatory Visit: Payer: Self-pay

## 2020-07-13 ENCOUNTER — Ambulatory Visit (INDEPENDENT_AMBULATORY_CARE_PROVIDER_SITE_OTHER): Payer: 59 | Admitting: Podiatry

## 2020-07-13 DIAGNOSIS — L600 Ingrowing nail: Secondary | ICD-10-CM | POA: Diagnosis not present

## 2020-07-13 NOTE — Progress Notes (Signed)
   Subjective: 64 y.o. male presents today status post permanent nail avulsion procedure of the medial border left great toe that was performed on 07/01/2020.  Patient states he is feeling much better.  He has been soaking his foot and applying the antibiotic cream as instructed.  No new complaints at this time.   Past Medical History:  Diagnosis Date  . Guillain Barr syndrome ([place])    When he was 27, he received a flu shot caused him to develop Guillain Barre Syndrome  . Hyperlipemia   . Hypothyroidism 07/24/2019  . Measles     Objective: Skin is warm, dry and supple. Nail and respective nail fold appears to be healing appropriately. Open wound to the associated nail fold with a granular wound base and moderate amount of fibrotic tissue. Minimal drainage noted. Mild erythema around the periungual region likely due to phenol chemical matricectomy.  Assessment: #1 postop permanent partial nail avulsion medial border left great toe #2 open wound periungual nail fold of respective digit.   Plan of care: #1 patient was evaluated  #2 debridement of open wound was performed to the periungual border of the respective toe using a currette. Antibiotic ointment and Band-Aid was applied. #3 patient is to return to clinic on a PRN basis.   Edrick Kins, DPM [facility]  Dr. Edrick Kins, DPM    [address], [place] [address]                Office [phone]  Fax [phone]

## 2020-08-05 DIAGNOSIS — E785 Hyperlipidemia, unspecified: Secondary | ICD-10-CM | POA: Insufficient documentation

## 2020-08-12 ENCOUNTER — Encounter: Payer: Self-pay | Admitting: Internal Medicine

## 2020-09-21 ENCOUNTER — Telehealth: Payer: Self-pay | Admitting: Internal Medicine

## 2020-09-21 ENCOUNTER — Other Ambulatory Visit: Payer: Self-pay | Admitting: Internal Medicine

## 2020-09-21 NOTE — Telephone Encounter (Signed)
1.Medication Requested: phentermine 37.5 MG capsule    2. Pharmacy (Name, Street, [place]): [facility] ([facility]), [place] - [address]  3. On Med List: yes   4. Last Visit with PCP: 10.27.21  5. Next visit date with PCP: 4.27.22   Agent: Please be advised that RX refills may take up to 3 business days. We ask that you follow-up with your pharmacy.

## 2020-09-23 MED ORDER — PHENTERMINE HCL 37.5 MG PO CAPS
ORAL_CAPSULE | ORAL | 2 refills | Status: DC
Start: 2020-09-23 — End: 2020-10-23

## 2020-09-23 NOTE — Addendum Note (Signed)
Addended by: Biagio Borg on: 09/23/2020 01:03 PM   Modules accepted: Orders

## 2020-10-01 ENCOUNTER — Other Ambulatory Visit: Payer: Self-pay

## 2020-10-02 ENCOUNTER — Encounter: Payer: Self-pay | Admitting: Internal Medicine

## 2020-10-02 ENCOUNTER — Ambulatory Visit (INDEPENDENT_AMBULATORY_CARE_PROVIDER_SITE_OTHER): Payer: 59 | Admitting: Internal Medicine

## 2020-10-02 VITALS — BP 138/80 | HR 90 | Temp 98.3°F | Ht 68.0 in | Wt 267.0 lb

## 2020-10-02 DIAGNOSIS — E559 Vitamin D deficiency, unspecified: Secondary | ICD-10-CM | POA: Diagnosis not present

## 2020-10-02 DIAGNOSIS — R739 Hyperglycemia, unspecified: Secondary | ICD-10-CM

## 2020-10-02 DIAGNOSIS — E538 Deficiency of other specified B group vitamins: Secondary | ICD-10-CM | POA: Diagnosis not present

## 2020-10-02 DIAGNOSIS — R350 Frequency of micturition: Secondary | ICD-10-CM

## 2020-10-02 DIAGNOSIS — E039 Hypothyroidism, unspecified: Secondary | ICD-10-CM

## 2020-10-02 DIAGNOSIS — M25552 Pain in left hip: Secondary | ICD-10-CM

## 2020-10-02 DIAGNOSIS — I10 Essential (primary) hypertension: Secondary | ICD-10-CM

## 2020-10-02 DIAGNOSIS — Z0000 Encounter for general adult medical examination without abnormal findings: Secondary | ICD-10-CM | POA: Diagnosis not present

## 2020-10-02 DIAGNOSIS — Z0001 Encounter for general adult medical examination with abnormal findings: Secondary | ICD-10-CM

## 2020-10-02 LAB — CBC WITH DIFFERENTIAL/PLATELET
Basophils Absolute: 0.1 10*3/uL (ref 0.0–0.1)
Basophils Relative: 0.7 % (ref 0.0–3.0)
Eosinophils Absolute: 0.4 10*3/uL (ref 0.0–0.7)
Eosinophils Relative: 4.1 % (ref 0.0–5.0)
HCT: 41 % (ref 39.0–52.0)
Hemoglobin: 14 g/dL (ref 13.0–17.0)
Lymphocytes Relative: 20.4 % (ref 12.0–46.0)
Lymphs Abs: 1.9 10*3/uL (ref 0.7–4.0)
MCHC: 34.1 g/dL (ref 30.0–36.0)
MCV: 90.3 fl (ref 78.0–100.0)
Monocytes Absolute: 1.3 10*3/uL — ABNORMAL HIGH (ref 0.1–1.0)
Monocytes Relative: 14.4 % — ABNORMAL HIGH (ref 3.0–12.0)
Neutro Abs: 5.6 10*3/uL (ref 1.4–7.7)
Neutrophils Relative %: 60.4 % (ref 43.0–77.0)
Platelets: 297 10*3/uL (ref 150.0–400.0)
RBC: 4.54 Mil/uL (ref 4.22–5.81)
RDW: 14.2 % (ref 11.5–15.5)
WBC: 9.2 10*3/uL (ref 4.0–10.5)

## 2020-10-02 LAB — BASIC METABOLIC PANEL
BUN: 22 mg/dL (ref 6–23)
CO2: 30 mEq/L (ref 19–32)
Calcium: 9.9 mg/dL (ref 8.4–10.5)
Chloride: 99 mEq/L (ref 96–112)
Creatinine, Ser: 1.42 mg/dL (ref 0.40–1.50)
GFR: 51.9 mL/min — ABNORMAL LOW (ref >60.00)
Glucose, Bld: 88 mg/dL (ref 70–99)
Potassium: 4.2 mEq/L (ref 3.5–5.1)
Sodium: 137 mEq/L (ref 135–145)

## 2020-10-02 LAB — LIPID PANEL
Cholesterol: 119 mg/dL (ref 0–200)
HDL: 37.9 mg/dL — ABNORMAL LOW (ref >39.00)
LDL Cholesterol: 52 mg/dL (ref 0–99)
NonHDL: 81.03
Total CHOL/HDL Ratio: 3
Triglycerides: 143 mg/dL (ref 0.0–149.0)
VLDL: 28.6 mg/dL (ref 0.0–40.0)

## 2020-10-02 LAB — URINALYSIS, ROUTINE W REFLEX MICROSCOPIC
Bilirubin Urine: NEGATIVE
Hgb urine dipstick: NEGATIVE
Ketones, ur: NEGATIVE
Leukocytes,Ua: NEGATIVE
Nitrite: NEGATIVE
Specific Gravity, Urine: 1.025 (ref 1.000–1.030)
Total Protein, Urine: NEGATIVE
Urine Glucose: NEGATIVE
Urobilinogen, UA: 1 (ref 0.0–1.0)
pH: 6 (ref 5.0–8.0)

## 2020-10-02 LAB — HEPATIC FUNCTION PANEL
ALT: 27 U/L (ref 0–53)
AST: 24 U/L (ref 0–37)
Albumin: 4.2 g/dL (ref 3.5–5.2)
Alkaline Phosphatase: 77 U/L (ref 39–117)
Bilirubin, Direct: 0.1 mg/dL (ref 0.0–0.3)
Total Bilirubin: 0.8 mg/dL (ref 0.2–1.2)
Total Protein: 7.2 g/dL (ref 6.0–8.3)

## 2020-10-02 LAB — HEMOGLOBIN A1C: Hgb A1c MFr Bld: 5.8 % (ref 4.6–6.5)

## 2020-10-02 LAB — TSH: TSH: 5.07 u[IU]/mL — ABNORMAL HIGH (ref 0.35–4.50)

## 2020-10-02 LAB — VITAMIN B12: Vitamin B-12: 392 pg/mL (ref 211–911)

## 2020-10-02 LAB — VITAMIN D 25 HYDROXY (VIT D DEFICIENCY, FRACTURES): VITD: 36.64 ng/mL (ref 30.00–100.00)

## 2020-10-02 LAB — MICROALBUMIN / CREATININE URINE RATIO
Creatinine,U: 146.3 mg/dL
Microalb Creat Ratio: 0.6 mg/g (ref 0.0–30.0)
Microalb, Ur: 0.9 mg/dL (ref 0.0–1.9)

## 2020-10-02 LAB — PSA: PSA: 0.47 ng/mL (ref 0.10–4.00)

## 2020-10-02 NOTE — Patient Instructions (Signed)
Your urinary frequency may be due to increase taking of fluids, but we will need to make sure of infection as well  Please continue all other medications as before, and refills have been done if requested.  Please have the pharmacy call with any other refills you may need.  Please continue your efforts at being more active, low cholesterol diet, and weight control.  You are otherwise up to date with prevention measures today.  Please keep your appointments with your specialists as you may have planned  Please go to the LAB at the blood drawing area for the tests to be done  You will be contacted by phone if any changes need to be made immediately.  Otherwise, you will receive a letter about your results with an explanation, but please check with MyChart first.  Please remember to sign up for MyChart if you have not done so, as this will be important to you in the future with finding out test results, communicating by private email, and scheduling acute appointments online when needed.  Please make an Appointment to return in 6 months, or sooner if needed

## 2020-10-02 NOTE — Progress Notes (Signed)
Established Patient Office Visit  Subjective:  Patient ID: Michael Khan, male    DOB: 09-Dec-1955  Age: 65 y.o. MRN: [id number]       Chief Complaint:: wellness exam and Urinary Frequency with recent wt loss, left hip pain and hyperglycemia       HPI:  Michael Khan is a 65 y.o. male here for wellness exam; plans to call for eye exam soon, declines referral.   Has lost wt with phentermine, now scheduled for left hip THR apr 5 at [place].  Goal wt is 260 for wt loss and surgury. Unfortunately cannot do even stationaly bike exercise due to left hip pain.  .Pt denies chest pain, increased sob or doe, wheezing, orthopnea, PND, increased LE swelling, palpitations, dizziness or syncope.  Pt denies new neurological symptoms such as new headache, or facial or extremity weakness or numbness   Pt denies polydipsia, polyuria,   BP has overall been improved since last visit.  Declines pneumovax due to hx of Guillian Barre.   Wt Readings from Last 3 Encounters:  10/02/20 267 lb (121.1 kg)  06/24/20 291 lb (132 kg)  03/04/20 287 lb 12.8 oz (130.5 kg)   BP Readings from Last 3 Encounters:  10/02/20 138/80  06/24/20 130/60  02/26/20 (!) 150/98   Immunization History  Administered Date(s) Administered  . PFIZER(Purple Top)SARS-COV-2 Vaccination 11/28/2019, 12/23/2019, 06/23/2020   Health Maintenance Due  Topic Date Due  . OPHTHALMOLOGY EXAM  10/09/2018        Also pt c/o increased urinary frequency up to 4-5 times per day, 3 times at night, gets the urge and almost wetting accidents sometimes. On going mild to mod for 1 mo.  DIet no change to account for increased sugar per pt.   Lab Results  Component Value Date   HGBA1C 6.1 11/28/2019  Does drink plenty of fluids trying to lose wt, has cotton mouth dry often recent, wondering about phentermine. Stream is usually fine and strong, color mostly clear with drinking more fluids, and Denies urinary symptoms such as dysuria, flank pain, hematuria or  n/v, fever, chills.  Nothing else seems to make better or worse.  Past Medical History:  Diagnosis Date  . Guillain Barr syndrome ([place])    When he was 23, he received a flu shot caused him to develop Guillain Barre Syndrome  . Hyperlipemia   . Hypothyroidism 07/24/2019  . Measles    Past Surgical History:  Procedure Laterality Date  . ANKLE RECONSTRUCTION    . CHOLECYSTECTOMY    . NOSE SURGERY    . WRIST RECONSTRUCTION      reports that he has never smoked. He has never used smokeless tobacco. He reports that he does not drink alcohol and does not use drugs. family history includes Healthy in his father; Liver cancer in his mother. Allergies  Allergen Reactions  . Drug Class [Haemophilus Influenzae Vaccines] Other (See Comments)    Caused to not walk  . Lipitor [Atorvastatin Calcium]     discomfort   Current Outpatient Medications on File Prior to Visit  Medication Sig Dispense Refill  . aspirin 81 MG EC tablet Take 1 tablet (81 mg total) by mouth daily. Swallow whole. 30 tablet 12  . cyclobenzaprine (FLEXERIL) 5 MG tablet Take 1 tablet (5 mg total) by mouth 3 (three) times daily as needed for muscle spasms. 40 tablet 1  . naproxen (NAPROSYN) 500 MG tablet Take 1 tablet (500 mg total) by mouth 2 (two)  times daily as needed for moderate pain. 60 tablet 2  . phentermine 37.5 MG capsule Take 1 capsule by mouth in the morning 30 capsule 2  . rosuvastatin (CRESTOR) 10 MG tablet TAKE 1 TABLET BY MOUTH ONCE DAILY 90 tablet 3  . triamterene-hydrochlorothiazide (MAXZIDE) 75-50 MG tablet Take 1 tablet by mouth daily. 90 tablet 3  . gentamicin cream (GARAMYCIN) 0.1 % Apply 1 application topically 2 (two) times daily. (Patient not taking: Reported on 10/02/2020) 30 g 1  . HYDROcodone-acetaminophen (NORCO/VICODIN) 5-325 MG tablet Take 1 tablet by mouth every 8 (eight) hours as needed. (Patient not taking: Reported on 10/02/2020) 15 tablet 0   No current facility-administered medications on file  prior to visit.        ROS:  All others reviewed and negative.  Objective        PE:  BP 138/80   Pulse 90   Temp 98.3 F (36.8 C) (Oral)   Ht 5\' 8"  (1.727 m)   Wt 267 lb (121.1 kg)   SpO2 98%   BMI 40.60 kg/m                 Constitutional: Pt appears in NAD               HENT: Head: NCAT.                Right Ear: External ear normal.                 Left Ear: External ear normal.                Eyes: . Pupils are equal, round, and reactive to light. Conjunctivae and EOM are normal               Nose: without d/c or deformity               Neck: Neck supple. Gross normal ROM               Cardiovascular: Normal rate and regular rhythm.                 Pulmonary/Chest: Effort normal and breath sounds without rales or wheezing.                Abd:  Soft, NT, ND, + BS, no organomegaly               Neurological: Pt is alert. At baseline orientation, motor grossly intact               Skin: Skin is warm. No rashes, no other new lesions, LE edema - trace bilateral               Psychiatric: Pt behavior is normal without agitation   Assessment/Plan:  Michael Khan is a 65 y.o. Black or African American [2] male with  has a past medical history of Guillain Barr syndrome ([place]), Hyperlipemia, Hypothyroidism (07/24/2019), and Measles.  Micro: none  Cardiac tracings I have personally interpreted today:  none  Pertinent Radiological findings (summarize): MRI left hip Jan 18 2020 IMPRESSION: 1. Suspected chronic left femoral head avascular necrosis with subchondral collapse and marrow edema throughout the left femoral head and neck. 2. Moderate secondary degenerative changes of the left hip with a moderate to large joint effusion. 3. No evidence of right femoral head avascular necrosis or acute osseous findings.    Lab Results  Component Value Date   WBC 9.2 10/02/2020  HGB 14.0 10/02/2020   HCT 41.0 10/02/2020   PLT 297.0 10/02/2020   GLUCOSE 88 10/02/2020   CHOL 119  10/02/2020   TRIG 143.0 10/02/2020   HDL 37.90 (L) 10/02/2020   LDLCALC 52 10/02/2020   ALT 27 10/02/2020   AST 24 10/02/2020   NA 137 10/02/2020   K 4.2 10/02/2020   CL 99 10/02/2020   CREATININE 1.42 10/02/2020   BUN 22 10/02/2020   CO2 30 10/02/2020   TSH 5.07 (H) 10/02/2020   PSA 0.47 10/02/2020   INR 1.0 10/03/2008   HGBA1C 5.8 10/02/2020   MICROALBUR 0.9 10/02/2020      Assessment & Plan:   Problem List Items Addressed This Visit      High   Encounter for routine adult medical exam with abnormal findings - Primary    Age and sex appropriate education and counseling updated with regular exercise and diet Referrals for preventative services - none needed - up to date Immunizations addressed - none needed - declines pneumovax Smoking counseling  - none needed Evidence for depression or other mood disorder - none significant Most recent labs reviewed. I have personally reviewed and have noted: 1) the patient's medical and social history 2) The patient's current medications and supplements 3) The patient's height, weight, and BMI have been recorded in the chart       Relevant Orders   PSA (Completed)     Medium   Vitamin D deficiency    Last vitamin D Lab Results  Component Value Date   VD25OH 36.64 10/02/2020   Stable, cont oral replacement      Relevant Orders   VITAMIN D 25 Hydroxy (Vit-D Deficiency, Fractures) (Completed)   Urinary frequency    I suspect possible increase due to increased fluids with trying to lose wt, cant r/o infection or other, for urine culture with labs      Relevant Orders   Urine Culture   Left hip pain    Now s/p effective wt loss, to f/u [facility] as planned, continue phentermine for now      Hypothyroidism    Lab Results  Component Value Date   TSH 5.07 (H) 10/02/2020   Stable, pt to continue levothyroxine       Hypertension    BP Readings from Last 3 Encounters:  10/02/20 138/80  06/24/20 130/60  02/26/20  (!) 150/98   Stable, pt to continue medical treatment maxide       Relevant Orders   Lipid panel (Completed)   Hepatic function panel (Completed)   CBC with Differential/Platelet (Completed)   TSH (Completed)   Urinalysis, Routine w reflex microscopic (Completed)   Basic metabolic panel (Completed)   Hyperglycemia    Lab Results  Component Value Date   HGBA1C 5.8 10/02/2020   Stable, pt to continue current medical treatment  - diet  Current Outpatient Medications (Endocrine & Metabolic):  .  levothyroxine (SYNTHROID) 75 MCG tablet, Take 1 tablet (75 mcg total) by mouth daily.  Current Outpatient Medications (Cardiovascular):  .  rosuvastatin (CRESTOR) 10 MG tablet, TAKE 1 TABLET BY MOUTH ONCE DAILY .  triamterene-hydrochlorothiazide (MAXZIDE) 75-50 MG tablet, Take 1 tablet by mouth daily.   Current Outpatient Medications (Analgesics):  .  aspirin 81 MG EC tablet, Take 1 tablet (81 mg total) by mouth daily. Swallow whole. .  naproxen (NAPROSYN) 500 MG tablet, Take 1 tablet (500 mg total) by mouth 2 (two) times daily as needed for moderate pain. Marland Kitchen  HYDROcodone-acetaminophen (NORCO/VICODIN) 5-325 MG tablet, Take 1 tablet by mouth every 8 (eight) hours as needed. (Patient not taking: Reported on 10/02/2020)   Current Outpatient Medications (Other):  .  cyclobenzaprine (FLEXERIL) 5 MG tablet, Take 1 tablet (5 mg total) by mouth 3 (three) times daily as needed for muscle spasms. .  phentermine 37.5 MG capsule, Take 1 capsule by mouth in the morning .  gentamicin cream (GARAMYCIN) 0.1 %, Apply 1 application topically 2 (two) times daily. (Patient not taking: Reported on 10/02/2020)       Relevant Orders   Microalbumin / creatinine urine ratio (Completed)   Hemoglobin A1c (Completed)    Other Visit Diagnoses    B12 deficiency       Relevant Orders   Vitamin B12 (Completed)      No orders of the defined types were placed in this encounter.   Follow-up: Return in about 6  months (around 04/01/2021).   Cathlean Cower, MD 10/03/2020 3:03 PM [facility]

## 2020-10-03 ENCOUNTER — Other Ambulatory Visit: Payer: Self-pay | Admitting: Internal Medicine

## 2020-10-03 ENCOUNTER — Encounter: Payer: Self-pay | Admitting: Internal Medicine

## 2020-10-03 LAB — URINE CULTURE: Result:: NO GROWTH

## 2020-10-03 MED ORDER — LEVOTHYROXINE SODIUM 75 MCG PO TABS: 75.0000 ug | ORAL_TABLET | Freq: Every day | ORAL | 3 refills | Status: DC

## 2020-10-03 NOTE — Assessment & Plan Note (Signed)
I suspect possible increase due to increased fluids with trying to lose wt, cant r/o infection or other, for urine culture with labs

## 2020-10-03 NOTE — Assessment & Plan Note (Signed)
Lab Results  Component Value Date   HGBA1C 5.8 10/02/2020   Stable, pt to continue current medical treatment  - diet  Current Outpatient Medications (Endocrine & Metabolic):  .  levothyroxine (SYNTHROID) 75 MCG tablet, Take 1 tablet (75 mcg total) by mouth daily.  Current Outpatient Medications (Cardiovascular):  .  rosuvastatin (CRESTOR) 10 MG tablet, TAKE 1 TABLET BY MOUTH ONCE DAILY .  triamterene-hydrochlorothiazide (MAXZIDE) 75-50 MG tablet, Take 1 tablet by mouth daily.   Current Outpatient Medications (Analgesics):  .  aspirin 81 MG EC tablet, Take 1 tablet (81 mg total) by mouth daily. Swallow whole. .  naproxen (NAPROSYN) 500 MG tablet, Take 1 tablet (500 mg total) by mouth 2 (two) times daily as needed for moderate pain. Marland Kitchen  HYDROcodone-acetaminophen (NORCO/VICODIN) 5-325 MG tablet, Take 1 tablet by mouth every 8 (eight) hours as needed. (Patient not taking: Reported on 10/02/2020)   Current Outpatient Medications (Other):  .  cyclobenzaprine (FLEXERIL) 5 MG tablet, Take 1 tablet (5 mg total) by mouth 3 (three) times daily as needed for muscle spasms. .  phentermine 37.5 MG capsule, Take 1 capsule by mouth in the morning .  gentamicin cream (GARAMYCIN) 0.1 %, Apply 1 application topically 2 (two) times daily. (Patient not taking: Reported on 10/02/2020)

## 2020-10-03 NOTE — Assessment & Plan Note (Signed)
Last vitamin D Lab Results  Component Value Date   VD25OH 36.64 10/02/2020   Stable, cont oral replacement

## 2020-10-03 NOTE — Assessment & Plan Note (Signed)
Now s/p effective wt loss, to f/u [facility] as planned, continue phentermine for now

## 2020-10-03 NOTE — Assessment & Plan Note (Signed)
BP Readings from Last 3 Encounters:  10/02/20 138/80  06/24/20 130/60  02/26/20 (!) 150/98   Stable, pt to continue medical treatment maxide

## 2020-10-03 NOTE — Assessment & Plan Note (Signed)
Lab Results  Component Value Date   TSH 5.07 (H) 10/02/2020   Stable, pt to continue levothyroxine

## 2020-10-03 NOTE — Assessment & Plan Note (Addendum)
Age and sex appropriate education and counseling updated with regular exercise and diet Referrals for preventative services - none needed - up to date Immunizations addressed - none needed - declines pneumovax Smoking counseling  - none needed Evidence for depression or other mood disorder - none significant Most recent labs reviewed. I have personally reviewed and have noted: 1) the patient's medical and social history 2) The patient's current medications and supplements 3) The patient's height, weight, and BMI have been recorded in the chart

## 2020-10-04 ENCOUNTER — Encounter: Payer: Self-pay | Admitting: Internal Medicine

## 2020-10-21 ENCOUNTER — Telehealth: Payer: Self-pay | Admitting: Internal Medicine

## 2020-10-21 NOTE — Telephone Encounter (Signed)
phentermine 37.5 MG capsule [facility] [place] [facility] [place] Phone:  [phone]  Fax:  [phone]     Requesting a refill  Last seen- 02.04.22 Next apt- n/a

## 2020-10-23 MED ORDER — PHENTERMINE HCL 37.5 MG PO CAPS: ORAL_CAPSULE | ORAL | 0 refills | Status: DC

## 2020-11-05 DIAGNOSIS — Z9189 Other specified personal risk factors, not elsewhere classified: Secondary | ICD-10-CM | POA: Insufficient documentation

## 2020-12-01 DIAGNOSIS — G8918 Other acute postprocedural pain: Secondary | ICD-10-CM | POA: Insufficient documentation

## 2020-12-01 DIAGNOSIS — M87052 Idiopathic aseptic necrosis of left femur: Secondary | ICD-10-CM | POA: Insufficient documentation

## 2020-12-23 ENCOUNTER — Ambulatory Visit (INDEPENDENT_AMBULATORY_CARE_PROVIDER_SITE_OTHER): Payer: 59 | Admitting: Internal Medicine

## 2020-12-23 ENCOUNTER — Encounter: Payer: Self-pay | Admitting: Internal Medicine

## 2020-12-23 ENCOUNTER — Other Ambulatory Visit: Payer: Self-pay

## 2020-12-23 VITALS — BP 136/74 | HR 88 | Temp 98.1°F | Ht 68.0 in | Wt 264.0 lb

## 2020-12-23 DIAGNOSIS — E7800 Pure hypercholesterolemia, unspecified: Secondary | ICD-10-CM

## 2020-12-23 DIAGNOSIS — I10 Essential (primary) hypertension: Secondary | ICD-10-CM | POA: Diagnosis not present

## 2020-12-23 DIAGNOSIS — R739 Hyperglycemia, unspecified: Secondary | ICD-10-CM | POA: Diagnosis not present

## 2020-12-23 DIAGNOSIS — M79605 Pain in left leg: Secondary | ICD-10-CM

## 2020-12-23 DIAGNOSIS — M79604 Pain in right leg: Secondary | ICD-10-CM | POA: Diagnosis not present

## 2020-12-23 DIAGNOSIS — R202 Paresthesia of skin: Secondary | ICD-10-CM | POA: Diagnosis not present

## 2020-12-23 DIAGNOSIS — M79671 Pain in right foot: Secondary | ICD-10-CM

## 2020-12-23 DIAGNOSIS — E538 Deficiency of other specified B group vitamins: Secondary | ICD-10-CM

## 2020-12-23 DIAGNOSIS — M79672 Pain in left foot: Secondary | ICD-10-CM

## 2020-12-23 DIAGNOSIS — E039 Hypothyroidism, unspecified: Secondary | ICD-10-CM

## 2020-12-23 MED ORDER — TRIAMTERENE-HCTZ 75-50 MG PO TABS: 1.0000 | ORAL_TABLET | Freq: Every day | ORAL | 3 refills | Status: DC

## 2020-12-23 NOTE — Progress Notes (Signed)
Patient ID: Michael Khan, male   DOB: [birth year], 65 y.o.   MRN: [id number]        Chief Complaint: cold toes       HPI:  Michael Khan is a 65 y.o. male here with concern of over 1 mo onset sensation mostly constant mild cold like but when he touches the toes they are warm.  Otherwise doing ok after left hip surgury, without falls or significant pain.  Denies significant worsening LBP or radicular symptoms.  Hs low b12, has been taking b12.  Pt denies chest pain, increased sob or doe, wheezing, orthopnea, PND, increased LE swelling, palpitations, dizziness or syncope.  Pt denies polydipsia, polyuria, or other new focal neuro s/s.  No other new complaints     Denies hyper or hypo thyroid symptoms such as voice, skin or hair change. Wt Readings from Last 3 Encounters:  12/23/20 264 lb (119.7 kg)  10/02/20 267 lb (121.1 kg)  06/24/20 291 lb (132 kg)   BP Readings from Last 3 Encounters:  12/23/20 136/74  10/02/20 138/80  06/24/20 130/60         Past Medical History:  Diagnosis Date  . Guillain Barr syndrome ([place])    When he was 8, he received a flu shot caused him to develop Guillain Barre Syndrome  . Hyperlipemia   . Hypothyroidism 07/24/2019  . Measles    Past Surgical History:  Procedure Laterality Date  . ANKLE RECONSTRUCTION    . CHOLECYSTECTOMY    . NOSE SURGERY    . WRIST RECONSTRUCTION      reports that he has never smoked. He has never used smokeless tobacco. He reports that he does not drink alcohol and does not use drugs. family history includes Healthy in his father; Liver cancer in his mother. Allergies  Allergen Reactions  . Drug Class [Haemophilus Influenzae Vaccines] Other (See Comments)    Caused to not walk  . Lipitor [Atorvastatin Calcium]     discomfort   Current Outpatient Medications on File Prior to Visit  Medication Sig Dispense Refill  . ascorbic acid (VITAMIN C) 500 MG tablet Take by mouth.    Marland Kitchen aspirin 81 MG EC tablet Take 1 tablet (81 mg  total) by mouth daily. Swallow whole. 30 tablet 12  . cyclobenzaprine (FLEXERIL) 5 MG tablet Take 1 tablet (5 mg total) by mouth 3 (three) times daily as needed for muscle spasms. 40 tablet 1  . gentamicin cream (GARAMYCIN) 0.1 % Apply 1 application topically 2 (two) times daily. 30 g 1  . HYDROcodone-acetaminophen (NORCO/VICODIN) 5-325 MG tablet Take 1 tablet by mouth every 8 (eight) hours as needed. 15 tablet 0  . levothyroxine (SYNTHROID) 75 MCG tablet Take 1 tablet (75 mcg total) by mouth daily. 90 tablet 3  . naproxen (NAPROSYN) 500 MG tablet Take 1 tablet (500 mg total) by mouth 2 (two) times daily as needed for moderate pain. 60 tablet 2  . pantoprazole (PROTONIX) 40 MG tablet Take by mouth.    . phentermine 37.5 MG capsule Take 1 capsule by mouth in the morning 30 capsule 0  . rosuvastatin (CRESTOR) 10 MG tablet TAKE 1 TABLET BY MOUTH ONCE DAILY 90 tablet 3  . Cholecalciferol 50 MCG (2000 UT) CAPS Take by mouth.     No current facility-administered medications on file prior to visit.        ROS:  All others reviewed and negative.  Objective        PE:  BP 136/74 (BP Location: Left Arm, Patient Position: Sitting, Cuff Size: Large)   Pulse 88   Temp 98.1 F (36.7 C) (Oral)   Ht 5\' 8"  (1.727 m)   Wt 264 lb (119.7 kg)   SpO2 98%   BMI 40.14 kg/m                 Constitutional: Pt appears in NAD               HENT: Head: NCAT.                Right Ear: External ear normal.                 Left Ear: External ear normal.                Eyes: . Pupils are equal, round, and reactive to light. Conjunctivae and EOM are normal               Nose: without d/c or deformity               Neck: Neck supple. Gross normal ROM               Cardiovascular: Normal rate and regular rhythm.                 Pulmonary/Chest: Effort normal and breath sounds without rales or wheezing.                Abd:  Soft, NT, ND, + BS, no organomegaly               Neurological: Pt is alert. At baseline  orientation, motor grossly intact               Skin: Skin is warm. No rashes, no other new lesions, LE edema - none, dorsalis pedis trace to 1+ bilat               Psychiatric: Pt behavior is normal without agitation   Micro: none  Cardiac tracings I have personally interpreted today:  none  Pertinent Radiological findings (summarize): none   Lab Results  Component Value Date   WBC 9.2 10/02/2020   HGB 14.0 10/02/2020   HCT 41.0 10/02/2020   PLT 297.0 10/02/2020   GLUCOSE 88 10/02/2020   CHOL 119 10/02/2020   TRIG 143.0 10/02/2020   HDL 37.90 (L) 10/02/2020   LDLCALC 52 10/02/2020   ALT 27 10/02/2020   AST 24 10/02/2020   NA 137 10/02/2020   K 4.2 10/02/2020   CL 99 10/02/2020   CREATININE 1.42 10/02/2020   BUN 22 10/02/2020   CO2 30 10/02/2020   TSH 5.07 (H) 10/02/2020   PSA 0.47 10/02/2020   INR 1.0 10/03/2008   HGBA1C 5.8 10/02/2020   MICROALBUR 0.9 10/02/2020   Assessment/Plan:  Michael Khan is a 65 y.o. Black or African American [2] male with  has a past medical history of Guillain Barr syndrome ([place]), Hyperlipemia, Hypothyroidism (07/24/2019), and Measles.  Bilateral leg and foot pain With cold toes - ? Neuritic but cant r/o PAD  - for LE arterial study   Paresthesia of both lower extremities Also consider LE emg/ncs or neuro referral but declines for now  Hypothyroidism Lab Results  Component Value Date   TSH 5.07 (H) 10/02/2020   TSH slight hig but pt to continue levothyroxine   Hypertension BP Readings from Last 3 Encounters:  12/23/20 136/74  10/02/20 138/80  06/24/20  130/60   Stable, pt to continue medical treatment maxide   Hyperglycemia Lab Results  Component Value Date   HGBA1C 5.8 10/02/2020   Stable, pt to continue current medical treatment  - diet   HLD (hyperlipidemia) Lab Results  Component Value Date   LDLCALC 52 10/02/2020   Stable, pt to continue current statin crestor 10   B12 deficiency Lab Results  Component  Value Date   VITAMINB12 392 10/02/2020   Stable, cont oral replacement - b12 1000 mcg qd  Followup: Return in about 6 months (around 06/24/2021).  Cathlean Cower, MD 12/27/2020 7:35 PM [facility]

## 2020-12-23 NOTE — Patient Instructions (Signed)
Please continue all other medications as before, and refills have been done if requested.  Please have the pharmacy call with any other refills you may need.  Please continue your efforts at being more active, low cholesterol diet, and weight control.  Please keep your appointments with your specialists as you may have planned  You will be contacted regarding the referral for: leg circulation testing  Please start B12 1000 mcg per day for about 6 months  Please make an Appointment to return in 6 months, or sooner if needed

## 2020-12-27 ENCOUNTER — Encounter: Payer: Self-pay | Admitting: Internal Medicine

## 2020-12-27 NOTE — Assessment & Plan Note (Signed)
Lab Results  Component Value Date   HGBA1C 5.8 10/02/2020   Stable, pt to continue current medical treatment  - diet

## 2020-12-27 NOTE — Assessment & Plan Note (Signed)
Lab Results  Component Value Date   TSH 5.07 (H) 10/02/2020   TSH slight hig but pt to continue levothyroxine

## 2020-12-27 NOTE — Assessment & Plan Note (Signed)
Lab Results  Component Value Date   VITAMINB12 392 10/02/2020   Stable, cont oral replacement - b12 1000 mcg qd  

## 2020-12-27 NOTE — Assessment & Plan Note (Signed)
BP Readings from Last 3 Encounters:  12/23/20 136/74  10/02/20 138/80  06/24/20 130/60   Stable, pt to continue medical treatment maxide

## 2020-12-27 NOTE — Assessment & Plan Note (Signed)
Also consider LE emg/ncs or neuro referral but declines for now

## 2020-12-27 NOTE — Assessment & Plan Note (Signed)
With cold toes - ? Neuritic but cant r/o PAD  - for LE arterial study

## 2020-12-27 NOTE — Assessment & Plan Note (Signed)
Lab Results  Component Value Date   LDLCALC 52 10/02/2020   Stable, pt to continue current statin crestor 10

## 2021-01-01 ENCOUNTER — Ambulatory Visit (HOSPITAL_COMMUNITY)
Admission: RE | Admit: 2021-01-01 | Discharge: 2021-01-01 | Disposition: A | Discharge status: Home / Self Care | Payer: 59 | Source: Ambulatory Visit | Attending: Cardiology | Admitting: Cardiology

## 2021-01-01 ENCOUNTER — Telehealth: Payer: Self-pay | Admitting: Internal Medicine

## 2021-01-01 ENCOUNTER — Other Ambulatory Visit: Payer: Self-pay

## 2021-01-01 DIAGNOSIS — M79672 Pain in left foot: Secondary | ICD-10-CM | POA: Insufficient documentation

## 2021-01-01 DIAGNOSIS — M79671 Pain in right foot: Secondary | ICD-10-CM | POA: Diagnosis not present

## 2021-01-01 DIAGNOSIS — M79605 Pain in left leg: Secondary | ICD-10-CM | POA: Diagnosis not present

## 2021-01-01 DIAGNOSIS — M79604 Pain in right leg: Secondary | ICD-10-CM | POA: Insufficient documentation

## 2021-01-01 MED ORDER — PANTOPRAZOLE SODIUM 40 MG PO TBEC: 40.0000 mg | DELAYED_RELEASE_TABLET | Freq: Every day | ORAL | 3 refills | Status: DC

## 2021-01-01 NOTE — Telephone Encounter (Signed)
[facility]   1.Medication Requested:  pantoprazole (PROTONIX) 40 MG tablet  Meloxicam 7.5 mg   2. Pharmacy (Name, Street, [place]): [facility] [place] - [id number] Phone:  [phone]  Fax:  [phone]      3. On Med List: 1st: Yes 2nd: No  4. Last Visit with PCP: 12/23/2020  5. Next visit date with PCP: 06/24/2021   Agent: Please be advised that RX refills may take up to 3 business days. We ask that you follow-up with your pharmacy.

## 2021-01-02 ENCOUNTER — Encounter: Payer: Self-pay | Admitting: Internal Medicine

## 2021-06-24 ENCOUNTER — Ambulatory Visit: Payer: 59 | Admitting: Internal Medicine

## 2021-07-16 ENCOUNTER — Other Ambulatory Visit: Payer: Self-pay | Admitting: Internal Medicine

## 2021-07-16 NOTE — Telephone Encounter (Signed)
Please refill as per office routine med refill policy (all routine meds to be refilled for 3 mo or monthly (per pt preference) up to one year from last visit, then month to month grace period for 3 mo, then further med refills will have to be denied) ? ?

## 2021-07-22 ENCOUNTER — Other Ambulatory Visit: Payer: Self-pay | Admitting: Internal Medicine

## 2021-07-22 NOTE — Telephone Encounter (Signed)
Please refill as per office routine med refill policy (all routine meds to be refilled for 3 mo or monthly (per pt preference) up to one year from last visit, then month to month grace period for 3 mo, then further med refills will have to be denied) ? ?

## 2021-07-26 NOTE — Telephone Encounter (Signed)
Patient calling to follow-up on his medication refill request for triamterene-hydrochlorothiazide (MAXZIDE) 75-50 MG tablet  Patient states he took his last pill today  Please f/u with the patient

## 2021-07-28 ENCOUNTER — Other Ambulatory Visit: Payer: Self-pay

## 2021-07-28 ENCOUNTER — Ambulatory Visit (INDEPENDENT_AMBULATORY_CARE_PROVIDER_SITE_OTHER): Payer: 59 | Admitting: Internal Medicine

## 2021-07-28 ENCOUNTER — Encounter: Payer: Self-pay | Admitting: Internal Medicine

## 2021-07-28 VITALS — BP 148/70 | HR 77 | Temp 98.2°F | Ht 68.0 in | Wt 282.0 lb

## 2021-07-28 DIAGNOSIS — M79604 Pain in right leg: Secondary | ICD-10-CM | POA: Diagnosis not present

## 2021-07-28 DIAGNOSIS — R202 Paresthesia of skin: Secondary | ICD-10-CM | POA: Diagnosis not present

## 2021-07-28 DIAGNOSIS — R739 Hyperglycemia, unspecified: Secondary | ICD-10-CM

## 2021-07-28 DIAGNOSIS — E7800 Pure hypercholesterolemia, unspecified: Secondary | ICD-10-CM

## 2021-07-28 DIAGNOSIS — I10 Essential (primary) hypertension: Secondary | ICD-10-CM

## 2021-07-28 DIAGNOSIS — E559 Vitamin D deficiency, unspecified: Secondary | ICD-10-CM | POA: Diagnosis not present

## 2021-07-28 DIAGNOSIS — M79605 Pain in left leg: Secondary | ICD-10-CM

## 2021-07-28 DIAGNOSIS — E538 Deficiency of other specified B group vitamins: Secondary | ICD-10-CM

## 2021-07-28 DIAGNOSIS — Z0001 Encounter for general adult medical examination with abnormal findings: Secondary | ICD-10-CM

## 2021-07-28 NOTE — Progress Notes (Signed)
Patient ID: Michael Khan, male   DOB: June 01, 1956, 65 y.o.   MRN: [id number]        Chief Complaint: follow up HTN, obesity, persistent RLE pain below the knee       HPI:  Michael Khan is a 65 y.o. male here with c/o persistent RLE pain below the knee for > 6 mo, described today as a sort of tightness, discomfort and intermittent swelling, but also has some occasional muscle cramp as well as numbness discomfort in the setting of intermittent lower back pain as well, which is worse to lie down at night. Nothing else makes better or worse.  Conts to work [employer] for [employer] with sitting primarily for work.  Daughter incidentally killed in recent MVA so he is trying to care for her 3yo child, helpin several hours per day in addition to father of child living with him as well.  Also has recent left hip robotic surgury after AVN but this seems to be doing very well.  Also has hx of venoous insufficiency, has compression stockings but hard to wear.  Has had right knee pain in the past, but this has not been an issue recently. Wt back up after down to 261 with appetite suppressants, asks for refills.  Did have 2022 Arterial dopplers neg., as well as hx of venous dopplers neg for DVT in 2019.  Also has hx of GB with chronic persistent numbness of plantar foot.  Also BP at home has been < 140/90, declines need for change today.  Hard to lose wt, in fact gained more than ever recently due to being slowed down for the left hip surgury.  Pt denies chest pain, increased sob or doe, wheezing, orthopnea, PND, increased LE swelling, palpitations, dizziness or syncope.   Pt denies polydipsia, polyuria    Wt Readings from Last 3 Encounters:  07/28/21 282 lb (127.9 kg)  12/23/20 264 lb (119.7 kg)  10/02/20 267 lb (121.1 kg)   BP Readings from Last 3 Encounters:  07/28/21 (!) 148/70  12/23/20 136/74  10/02/20 138/80         Past Medical History:  Diagnosis Date   Guillain Barr syndrome [facility])    When he  was 30, he received a flu shot caused him to develop Guillain Barre Syndrome   Hyperlipemia    Hypothyroidism 07/24/2019   Measles    Past Surgical History:  Procedure Laterality Date   ANKLE RECONSTRUCTION     CHOLECYSTECTOMY     NOSE SURGERY     WRIST RECONSTRUCTION      reports that he has never smoked. He has never used smokeless tobacco. He reports that he does not drink alcohol and does not use drugs. family history includes Healthy in his father; Liver cancer in his mother. Allergies  Allergen Reactions   Drug Class [Haemophilus Influenzae Vaccines] Other (See Comments)    Caused to not walk   Lipitor [Atorvastatin Calcium]     discomfort   Current Outpatient Medications on File Prior to Visit  Medication Sig Dispense Refill   aspirin 81 MG EC tablet Take 1 tablet (81 mg total) by mouth daily. Swallow whole. 30 tablet 12   Cholecalciferol 50 MCG (2000 UT) CAPS Take by mouth.     levothyroxine (SYNTHROID) 75 MCG tablet Take 1 tablet (75 mcg total) by mouth daily. 90 tablet 3   naproxen (NAPROSYN) 500 MG tablet Take 1 tablet (500 mg total) by mouth 2 (two) times daily as  needed for moderate pain. 60 tablet 2   rosuvastatin (CRESTOR) 10 MG tablet Take 1 tablet by mouth once daily 30 tablet 0   triamterene-hydrochlorothiazide (MAXZIDE) 75-50 MG tablet Take 1 tablet by mouth once daily 30 tablet 0   gentamicin cream (GARAMYCIN) 0.1 % Apply 1 application topically 2 (two) times daily. (Patient not taking: Reported on 07/28/2021) 30 g 1   pantoprazole (PROTONIX) 40 MG tablet Take 1 tablet (40 mg total) by mouth daily. 90 tablet 3   phentermine 37.5 MG capsule Take 1 capsule by mouth in the morning (Patient not taking: Reported on 07/28/2021) 30 capsule 0   No current facility-administered medications on file prior to visit.        ROS:  All others reviewed and negative.  Objective        PE:  BP (!) 148/70 (BP Location: Left Arm, Patient Position: Sitting, Cuff Size: Large)    Pulse 77   Temp 98.2 F (36.8 C) (Oral)   Ht 5\' 8"  (1.727 m)   Wt 282 lb (127.9 kg)   SpO2 97%   BMI 42.88 kg/m                 Constitutional: Pt appears in NAD               HENT: Head: NCAT.                Right Ear: External ear normal.                 Left Ear: External ear normal.                Eyes: . Pupils are equal, round, and reactive to light. Conjunctivae and EOM are normal               Nose: without d/c or deformity               Neck: Neck supple. Gross normal ROM               Cardiovascular: Normal rate and regular rhythm.                 Pulmonary/Chest: Effort normal and breath sounds without rales or wheezing.                Abd:  Soft, NT, ND, + BS, no organomegaly               Neurological: Pt is alert. At baseline orientation, motor grossly intact               Skin: Skin is warm. No rashes, no other new lesions, LE edema - trace right > left               Psychiatric: Pt behavior is normal without agitation   Micro: none  Cardiac tracings I have personally interpreted today:  none  Pertinent Radiological findings (summarize): none   Lab Results  Component Value Date   WBC 9.2 10/02/2020   HGB 14.0 10/02/2020   HCT 41.0 10/02/2020   PLT 297.0 10/02/2020   GLUCOSE 88 10/02/2020   CHOL 119 10/02/2020   TRIG 143.0 10/02/2020   HDL 37.90 (L) 10/02/2020   LDLCALC 52 10/02/2020   ALT 27 10/02/2020   AST 24 10/02/2020   NA 137 10/02/2020   K 4.2 10/02/2020   CL 99 10/02/2020   CREATININE 1.42 10/02/2020   BUN 22 10/02/2020   CO2 30 10/02/2020  TSH 5.07 (H) 10/02/2020   PSA 0.47 10/02/2020   INR 1.0 10/03/2008   HGBA1C 5.8 10/02/2020   MICROALBUR 0.9 10/02/2020   Assessment/Plan:  Michael Khan is a 65 y.o. Black or African American [2] male with  has a past medical history of Guillain Barr syndrome ([place]), Hyperlipemia, Hypothyroidism (07/24/2019), and Measles.  HLD (hyperlipidemia) Lab Results  Component Value Date   LDLCALC 52  10/02/2020   Stable, pt to continue current statin crestor   Hyperglycemia Lab Results  Component Value Date   HGBA1C 5.8 10/02/2020   Stable, pt to continue current medical treatment  - diet   Hypertension BP Readings from Last 3 Encounters:  07/28/21 (!) 148/70  12/23/20 136/74  10/02/20 138/80   Mild elevated, pt states bp at home < 140/90, pt to continue medical treatment maxide as declines change   Vitamin D deficiency Last vitamin D Lab Results  Component Value Date   VD25OH 36.64 10/02/2020   Low normal, to start oral replacement   Morbid obesity ([place]) With contd worsening despite activity and diet - for referral Medical Wt Management  Paresthesia of both lower extremities With discomfort right >left  - for neurology referral for hopeful NCS/EMG  B12 deficiency Lab Results  Component Value Date   VITAMINB12 392 10/02/2020   Stable, cont oral replacement - b12 1000 mcg qd   Right leg pain Pt has multiple possible elements primarily msk vs neuritic - for neurology referral as above  Followup: Return in about 3 months (around 10/26/2021).  Cathlean Cower, MD 08/01/2021 6:03 PM [facility]

## 2021-07-28 NOTE — Patient Instructions (Signed)
Please continue all other medications as before, and refills have been done if requested.  Please have the pharmacy call with any other refills you may need.  Please continue your efforts at being more active, low cholesterol diet, and weight control  Please keep your appointments with your specialists as you may have planned  You will be contacted regarding the referral for: neurology, and weight management clinic  Please make an Appointment to return in 3 months, or sooner if needed, also with Lab Appointment for testing done 3-5 days before at the [facility] (so this is for TWO appointments - please see the scheduling desk as you leave)  Due to the ongoing Covid 19 pandemic, our lab now requires an appointment for any labs done at our office.  If you need labs done and do not have an appointment, please call our office ahead of time to schedule before presenting to the lab for your testing.

## 2021-08-01 ENCOUNTER — Encounter: Payer: Self-pay | Admitting: Internal Medicine

## 2021-08-01 DIAGNOSIS — M79604 Pain in right leg: Secondary | ICD-10-CM | POA: Insufficient documentation

## 2021-08-01 NOTE — Assessment & Plan Note (Signed)
BP Readings from Last 3 Encounters:  07/28/21 (!) 148/70  12/23/20 136/74  10/02/20 138/80   Mild elevated, pt states bp at home < 140/90, pt to continue medical treatment maxide as declines change

## 2021-08-01 NOTE — Assessment & Plan Note (Signed)
Last vitamin D Lab Results  Component Value Date   VD25OH 36.64 10/02/2020   Low normal, to start oral replacement

## 2021-08-01 NOTE — Assessment & Plan Note (Signed)
Lab Results  Component Value Date   HGBA1C 5.8 10/02/2020   Stable, pt to continue current medical treatment  - diet

## 2021-08-01 NOTE — Assessment & Plan Note (Signed)
Lab Results  Component Value Date   LDLCALC 52 10/02/2020   Stable, pt to continue current statin crestor

## 2021-08-01 NOTE — Assessment & Plan Note (Signed)
Pt has multiple possible elements primarily msk vs neuritic - for neurology referral as above

## 2021-08-01 NOTE — Assessment & Plan Note (Signed)
With discomfort right >left  - for neurology referral for hopeful NCS/EMG

## 2021-08-01 NOTE — Assessment & Plan Note (Signed)
With contd worsening despite activity and diet - for referral Medical Wt Management

## 2021-08-01 NOTE — Assessment & Plan Note (Signed)
Lab Results  Component Value Date   VITAMINB12 392 10/02/2020   Stable, cont oral replacement - b12 1000 mcg qd

## 2021-08-16 ENCOUNTER — Other Ambulatory Visit: Payer: Self-pay | Admitting: Internal Medicine

## 2021-08-16 ENCOUNTER — Other Ambulatory Visit: Payer: Self-pay

## 2021-08-16 NOTE — Telephone Encounter (Signed)
Please refill as per office routine med refill policy (all routine meds to be refilled for 3 mo or monthly (per pt preference) up to one year from last visit, then month to month grace period for 3 mo, then further med refills will have to be denied) ? ?

## 2021-08-19 ENCOUNTER — Other Ambulatory Visit: Payer: Self-pay | Admitting: Internal Medicine

## 2021-08-19 NOTE — Telephone Encounter (Signed)
Please refill as per office routine med refill policy (all routine meds to be refilled for 3 mo or monthly (per pt preference) up to one year from last visit, then month to month grace period for 3 mo, then further med refills will have to be denied) ? ?

## 2021-08-19 NOTE — Telephone Encounter (Signed)
Patient calling to check status of refill request  Informed patient request may take up to 3bds

## 2021-09-15 ENCOUNTER — Other Ambulatory Visit: Payer: Self-pay | Admitting: Internal Medicine

## 2021-09-15 NOTE — Telephone Encounter (Signed)
Please refill as per office routine med refill policy (all routine meds to be refilled for 3 mo or monthly (per pt preference) up to one year from last visit, then month to month grace period for 3 mo, then further med refills will have to be denied) ? ?

## 2021-09-20 ENCOUNTER — Other Ambulatory Visit: Payer: Self-pay | Admitting: Internal Medicine

## 2021-09-20 NOTE — Telephone Encounter (Signed)
Please refill as per office routine med refill policy (all routine meds to be refilled for 3 mo or monthly (per pt preference) up to one year from last visit, then month to month grace period for 3 mo, then further med refills will have to be denied) ? ?

## 2021-10-03 ENCOUNTER — Other Ambulatory Visit: Payer: Self-pay | Admitting: Internal Medicine

## 2021-10-03 NOTE — Telephone Encounter (Signed)
Please refill as per office routine med refill policy (all routine meds to be refilled for 3 mo or monthly (per pt preference) up to one year from last visit, then month to month grace period for 3 mo, then further med refills will have to be denied) ? ?

## 2021-10-05 ENCOUNTER — Encounter: Payer: Self-pay | Admitting: Internal Medicine

## 2021-10-05 ENCOUNTER — Ambulatory Visit (INDEPENDENT_AMBULATORY_CARE_PROVIDER_SITE_OTHER): Payer: 59

## 2021-10-05 ENCOUNTER — Other Ambulatory Visit: Payer: Self-pay | Admitting: Internal Medicine

## 2021-10-05 ENCOUNTER — Ambulatory Visit (INDEPENDENT_AMBULATORY_CARE_PROVIDER_SITE_OTHER): Payer: 59 | Admitting: Internal Medicine

## 2021-10-05 ENCOUNTER — Other Ambulatory Visit: Payer: Self-pay

## 2021-10-05 VITALS — BP 132/68 | HR 90 | Temp 98.5°F | Ht 68.0 in | Wt 294.0 lb

## 2021-10-05 DIAGNOSIS — R350 Frequency of micturition: Secondary | ICD-10-CM | POA: Diagnosis not present

## 2021-10-05 DIAGNOSIS — Z0001 Encounter for general adult medical examination with abnormal findings: Secondary | ICD-10-CM

## 2021-10-05 DIAGNOSIS — E559 Vitamin D deficiency, unspecified: Secondary | ICD-10-CM | POA: Diagnosis not present

## 2021-10-05 DIAGNOSIS — M545 Low back pain, unspecified: Secondary | ICD-10-CM

## 2021-10-05 DIAGNOSIS — H538 Other visual disturbances: Secondary | ICD-10-CM | POA: Diagnosis not present

## 2021-10-05 DIAGNOSIS — M25551 Pain in right hip: Secondary | ICD-10-CM | POA: Diagnosis not present

## 2021-10-05 DIAGNOSIS — R739 Hyperglycemia, unspecified: Secondary | ICD-10-CM | POA: Diagnosis not present

## 2021-10-05 DIAGNOSIS — E7800 Pure hypercholesterolemia, unspecified: Secondary | ICD-10-CM | POA: Diagnosis not present

## 2021-10-05 DIAGNOSIS — E538 Deficiency of other specified B group vitamins: Secondary | ICD-10-CM | POA: Diagnosis not present

## 2021-10-05 DIAGNOSIS — Z86010 Personal history of colonic polyps: Secondary | ICD-10-CM

## 2021-10-05 DIAGNOSIS — R1031 Right lower quadrant pain: Secondary | ICD-10-CM

## 2021-10-05 DIAGNOSIS — I10 Essential (primary) hypertension: Secondary | ICD-10-CM

## 2021-10-05 LAB — CBC WITH DIFFERENTIAL/PLATELET
Basophils Absolute: 0.1 10*3/uL (ref 0.0–0.1)
Basophils Relative: 1 % (ref 0.0–3.0)
Eosinophils Absolute: 0.3 10*3/uL (ref 0.0–0.7)
Eosinophils Relative: 3.2 % (ref 0.0–5.0)
HCT: 41.1 % (ref 39.0–52.0)
Hemoglobin: 13.7 g/dL (ref 13.0–17.0)
Lymphocytes Relative: 19.4 % (ref 12.0–46.0)
Lymphs Abs: 1.6 10*3/uL (ref 0.7–4.0)
MCHC: 33.3 g/dL (ref 30.0–36.0)
MCV: 89.1 fl (ref 78.0–100.0)
Monocytes Absolute: 1.2 10*3/uL — ABNORMAL HIGH (ref 0.1–1.0)
Monocytes Relative: 14.9 % — ABNORMAL HIGH (ref 3.0–12.0)
Neutro Abs: 5 10*3/uL (ref 1.4–7.7)
Neutrophils Relative %: 61.5 % (ref 43.0–77.0)
Platelets: 234 10*3/uL (ref 150.0–400.0)
RBC: 4.62 Mil/uL (ref 4.22–5.81)
RDW: 14.5 % (ref 11.5–15.5)
WBC: 8.1 10*3/uL (ref 4.0–10.5)

## 2021-10-05 LAB — LIPID PANEL
Cholesterol: 138 mg/dL (ref 0–200)
HDL: 36.6 mg/dL — ABNORMAL LOW (ref >39.00)
NonHDL: 101.27
Total CHOL/HDL Ratio: 4
Triglycerides: 284 mg/dL — ABNORMAL HIGH (ref 0.0–149.0)
VLDL: 56.8 mg/dL — ABNORMAL HIGH (ref 0.0–40.0)

## 2021-10-05 LAB — URINALYSIS, ROUTINE W REFLEX MICROSCOPIC
Bilirubin Urine: NEGATIVE
Hgb urine dipstick: NEGATIVE
Ketones, ur: NEGATIVE
Leukocytes,Ua: NEGATIVE
Nitrite: NEGATIVE
RBC / HPF: NONE SEEN (ref 0–?)
Specific Gravity, Urine: 1.02 (ref 1.000–1.030)
Total Protein, Urine: NEGATIVE
Urine Glucose: NEGATIVE
Urobilinogen, UA: 4 — AB (ref 0.0–1.0)
pH: 6 (ref 5.0–8.0)

## 2021-10-05 LAB — BASIC METABOLIC PANEL
BUN: 27 mg/dL — ABNORMAL HIGH (ref 6–23)
CO2: 29 mEq/L (ref 19–32)
Calcium: 9.7 mg/dL (ref 8.4–10.5)
Chloride: 102 mEq/L (ref 96–112)
Creatinine, Ser: 1.23 mg/dL (ref 0.40–1.50)
GFR: 61.23 mL/min (ref >60.00)
Glucose, Bld: 110 mg/dL — ABNORMAL HIGH (ref 70–99)
Potassium: 3.6 mEq/L (ref 3.5–5.1)
Sodium: 139 mEq/L (ref 135–145)

## 2021-10-05 LAB — HEMOGLOBIN A1C: Hgb A1c MFr Bld: 6.3 % (ref 4.6–6.5)

## 2021-10-05 LAB — HEPATIC FUNCTION PANEL
ALT: 27 U/L (ref 0–53)
AST: 23 U/L (ref 0–37)
Albumin: 4.1 g/dL (ref 3.5–5.2)
Alkaline Phosphatase: 64 U/L (ref 39–117)
Bilirubin, Direct: 0.1 mg/dL (ref 0.0–0.3)
Total Bilirubin: 0.7 mg/dL (ref 0.2–1.2)
Total Protein: 7.1 g/dL (ref 6.0–8.3)

## 2021-10-05 LAB — VITAMIN B12: Vitamin B-12: >1504 pg/mL — ABNORMAL HIGH (ref 211–911)

## 2021-10-05 LAB — LDL CHOLESTEROL, DIRECT: Direct LDL: 79 mg/dL

## 2021-10-05 LAB — MICROALBUMIN / CREATININE URINE RATIO
Creatinine,U: 101.8 mg/dL
Microalb Creat Ratio: 0.7 mg/g (ref 0.0–30.0)
Microalb, Ur: <0.7 mg/dL (ref 0.0–1.9)

## 2021-10-05 LAB — TSH: TSH: 4.62 u[IU]/mL (ref 0.35–5.50)

## 2021-10-05 LAB — VITAMIN D 25 HYDROXY (VIT D DEFICIENCY, FRACTURES): VITD: 44.82 ng/mL (ref 30.00–100.00)

## 2021-10-05 LAB — PSA: PSA: 0.31 ng/mL (ref 0.10–4.00)

## 2021-10-05 IMAGING — DX DG LUMBAR SPINE COMPLETE 4+V
5 series · 5 of 5 positions shown · non-contrast
Comparison: [DATE]

CLINICAL DATA: Pain right lower back and right hip

EXAM:
LUMBAR SPINE - COMPLETE 4+ VIEW

[l-spine ap]
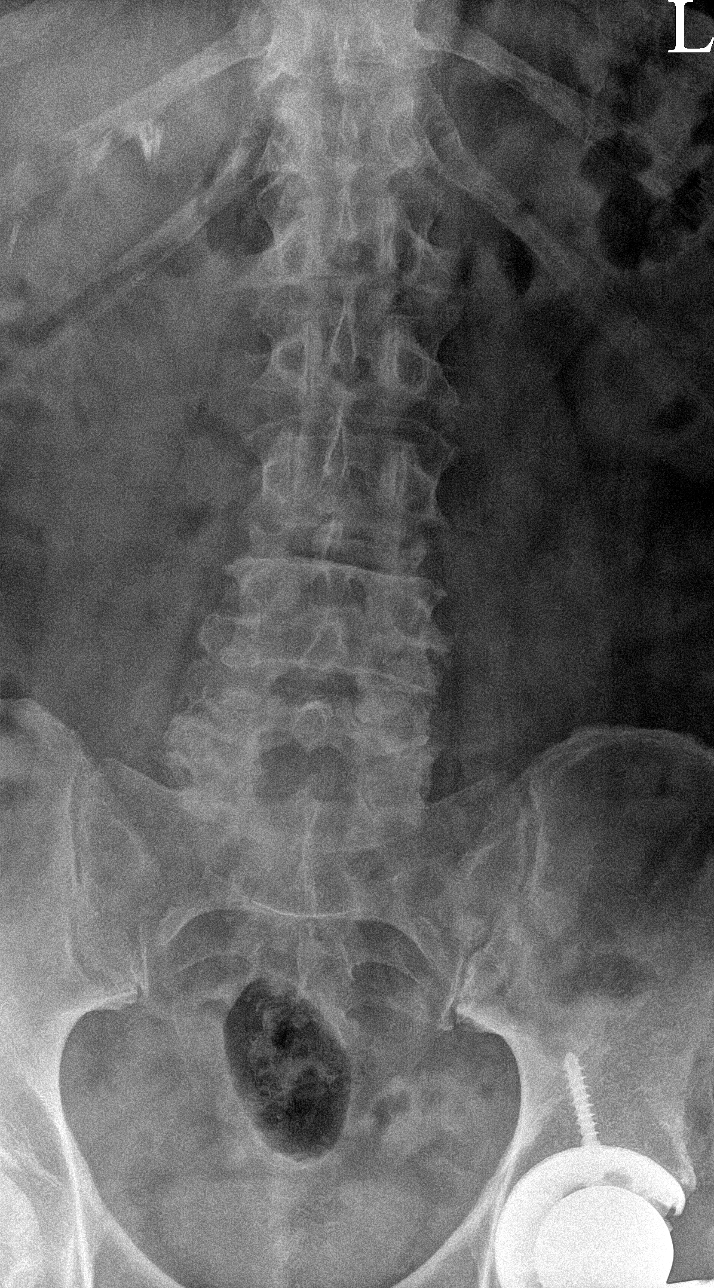

[l-spine obl (1 of 2)]
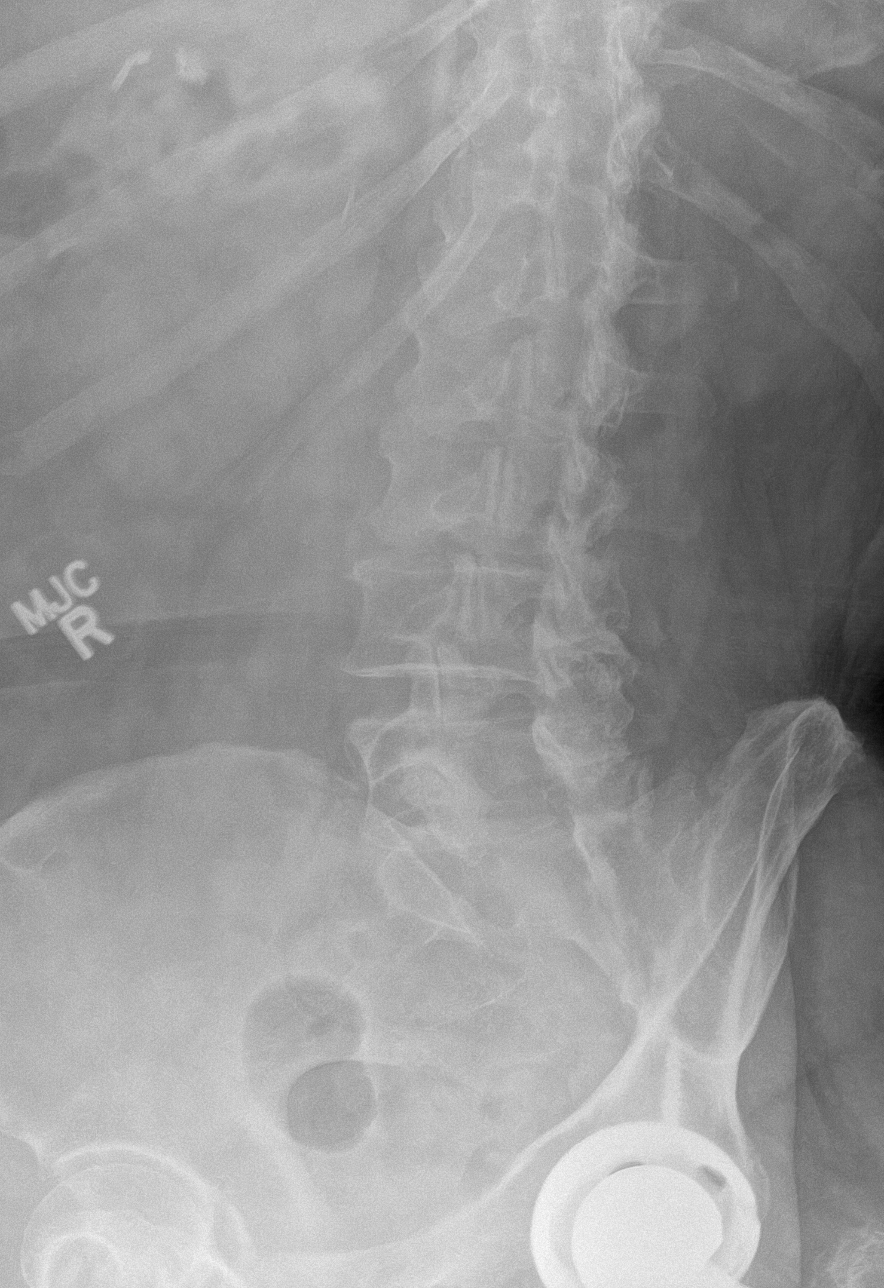

[l-spine obl (2 of 2)]
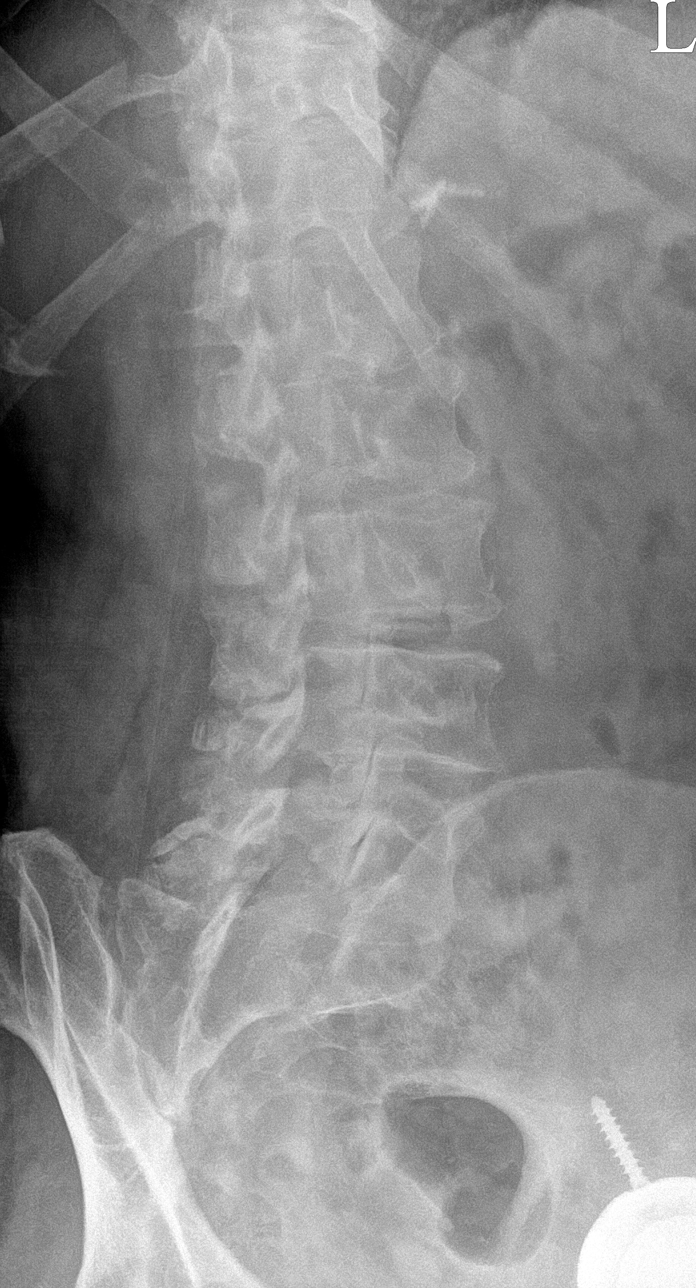

[l-spine lateral]
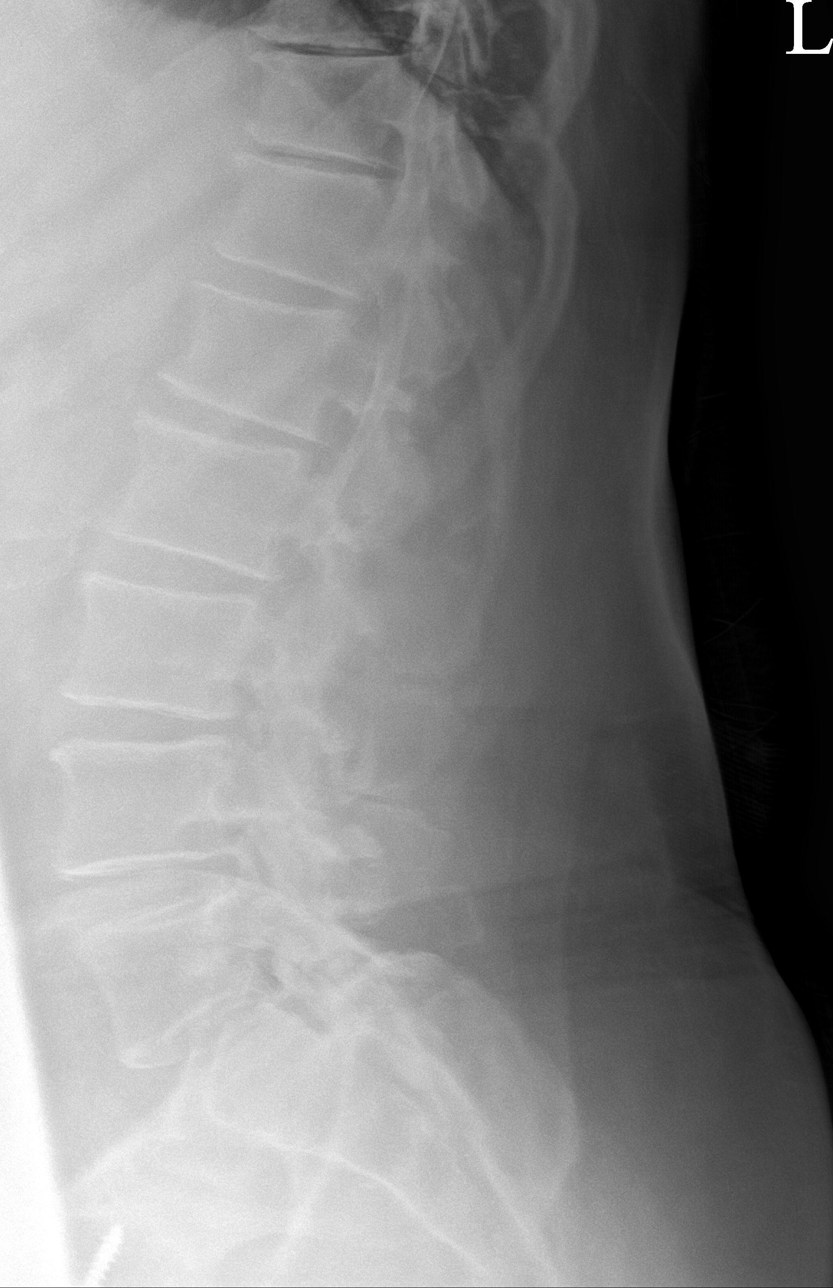

[l-spine spot]
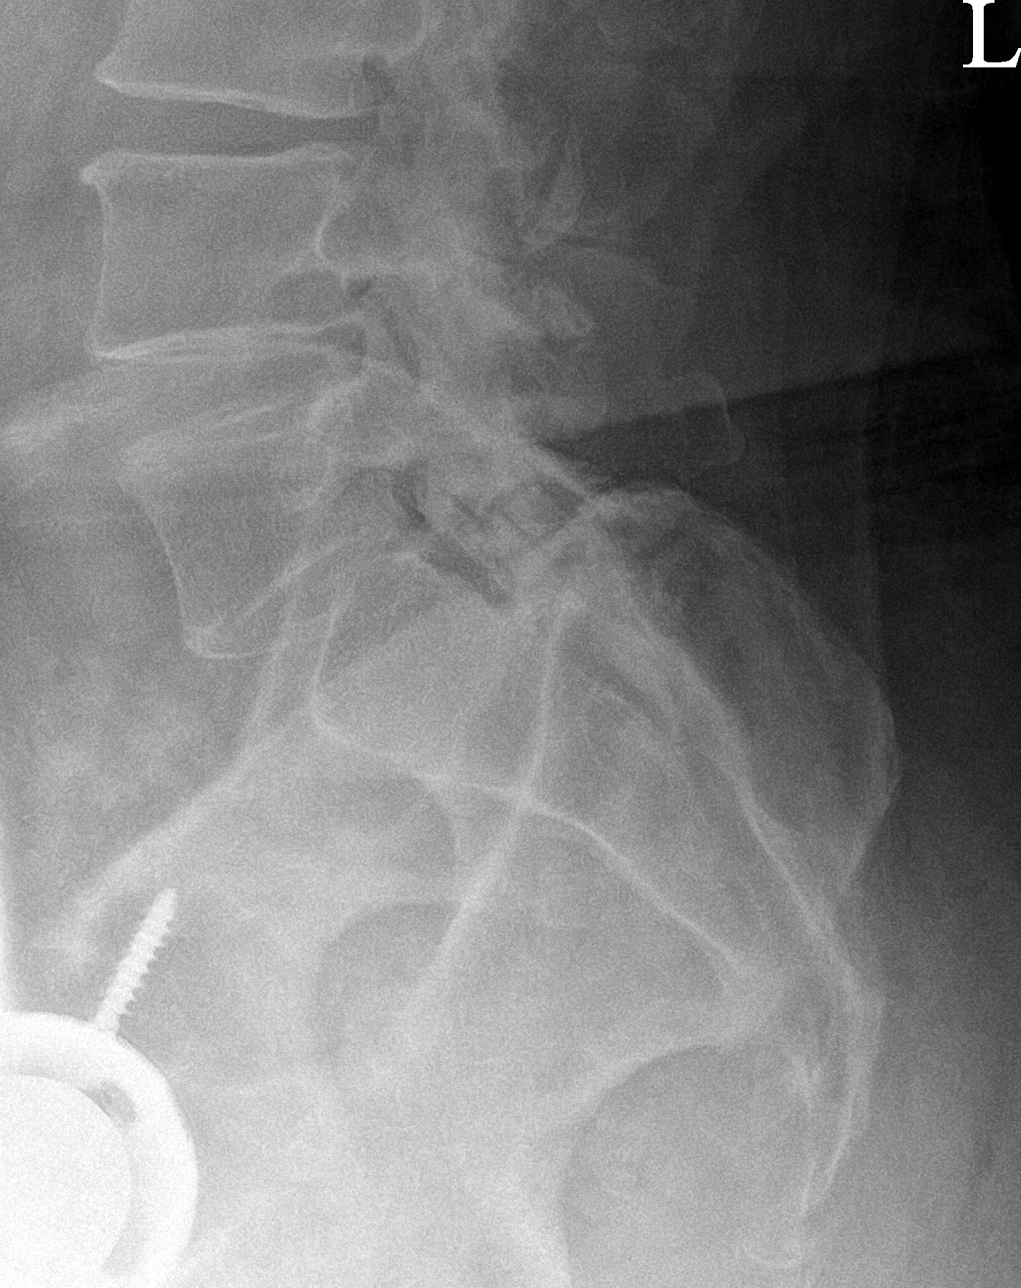

[5 of 5 positions shown; findings below may reference images not displayed]

FINDINGS: No recent fracture is seen. Degenerative changes are noted with bony
spurs and facet hypertrophy, more so in the lower lumbar spine.
Surgical clips are seen in the right upper quadrant. There is
previous left hip arthroplasty.
IMPRESSION: No recent fracture is seen. Lumbar spondylosis, more so in the lower
lumbar spine.

## 2021-10-05 IMAGING — DX DG HIP (WITH OR WITHOUT PELVIS) 2-3V*R*
3 series · 3 of 3 positions shown · non-contrast
Comparison: None.
COMPARISON: Pelvis and bilateral hip radiographs [DATE]
COMPARISON: None.

Addendum:
CLINICAL DATA: L4-5 [place]

EXAM:
DG HIP (WITH OR WITHOUT PELVIS) 2-3V RIGHT
CLINICAL DATA: Acute right hip pain with groin involvement. Worse
to walk.

[pelvis ap]
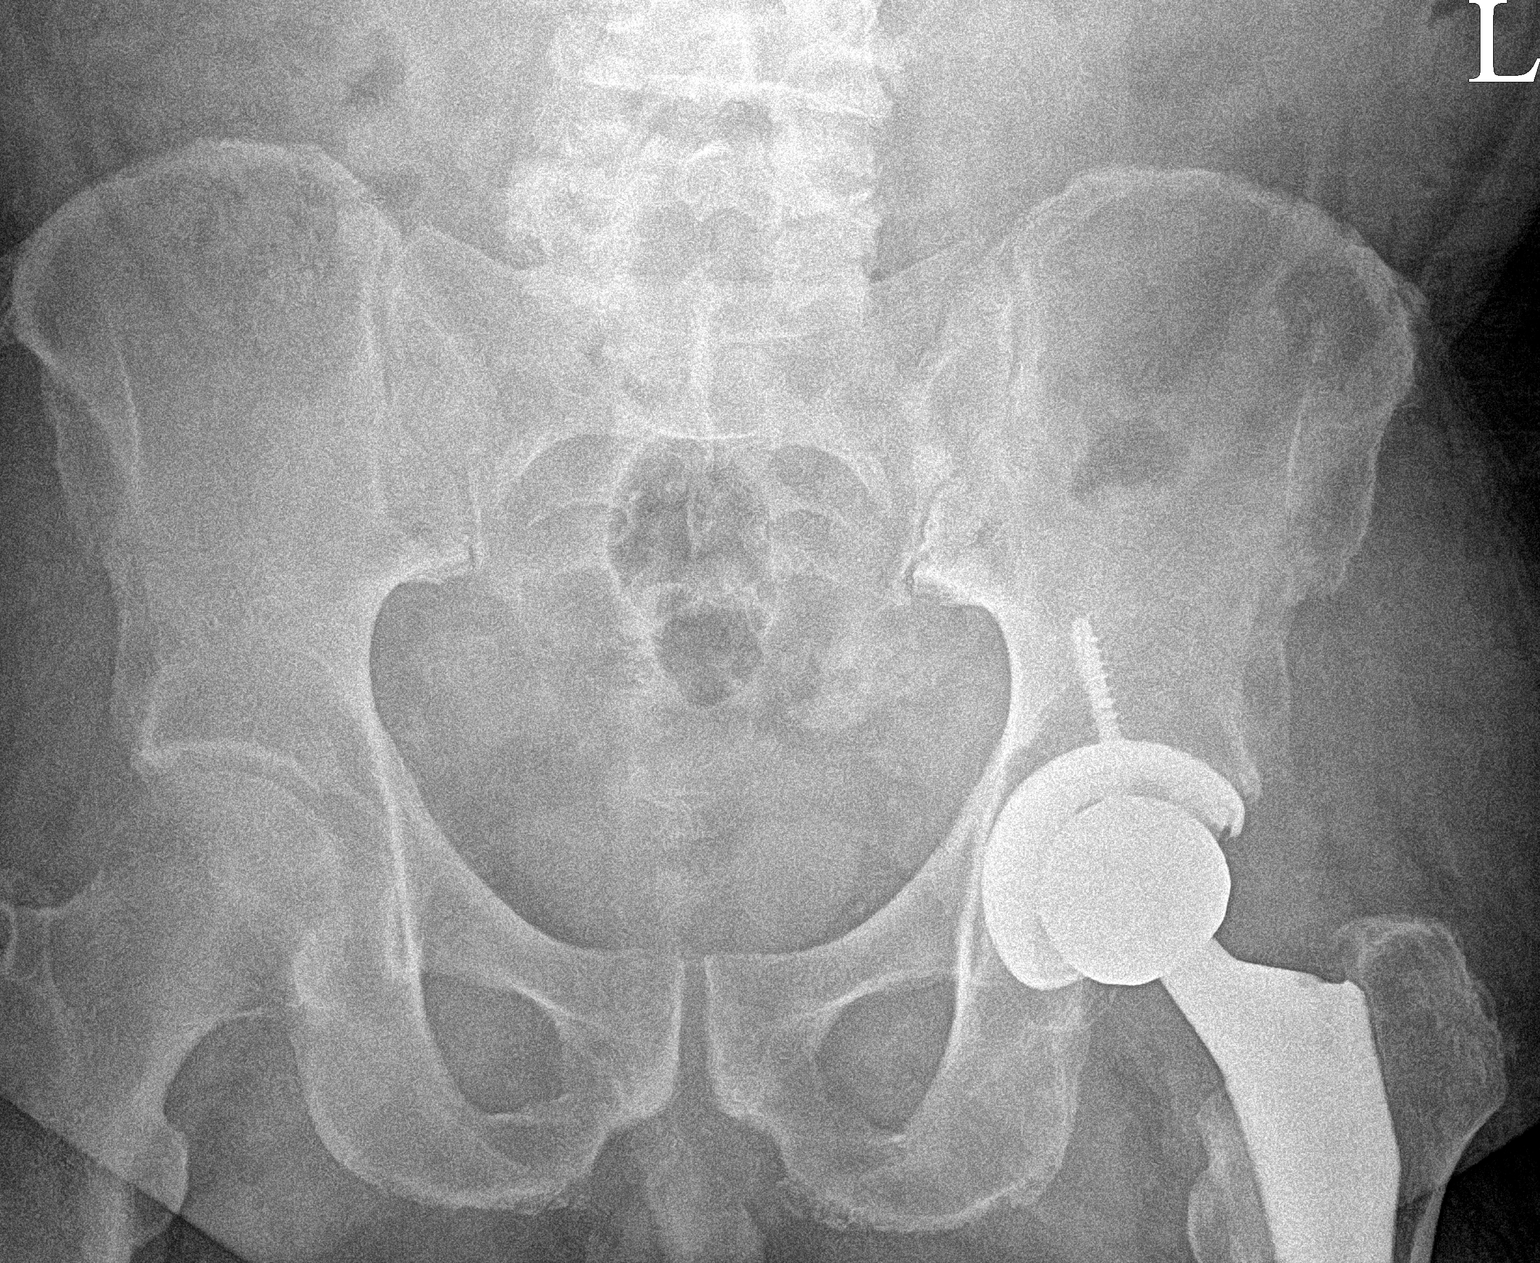

[hip ap]
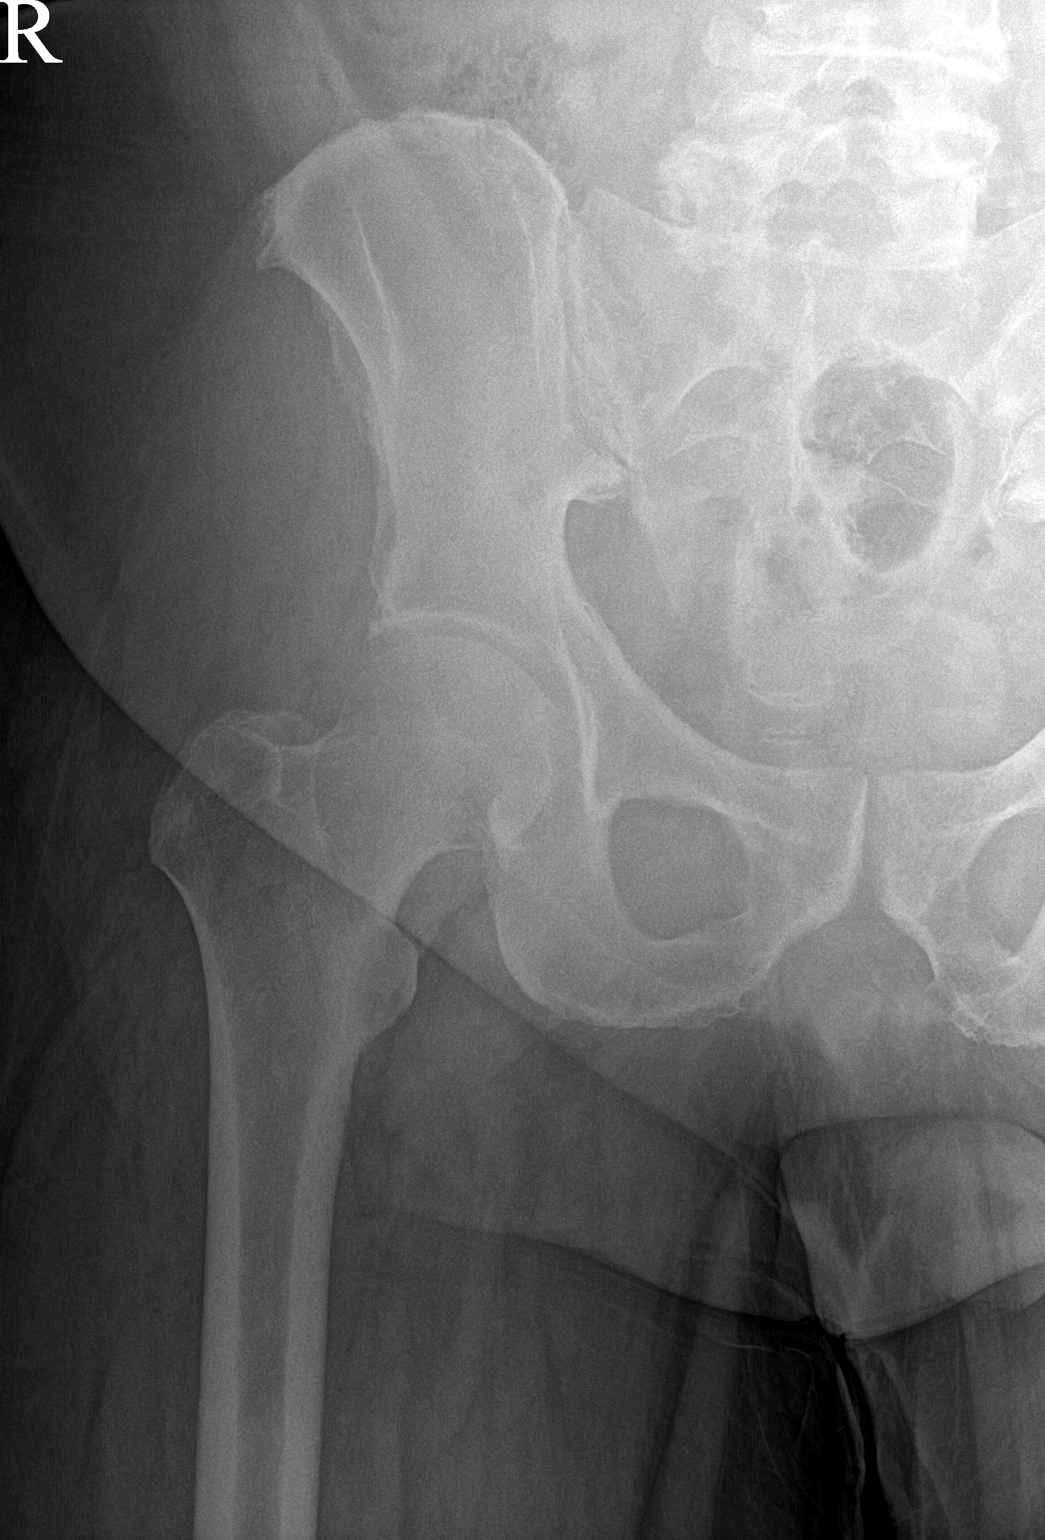

[hip frog leg]
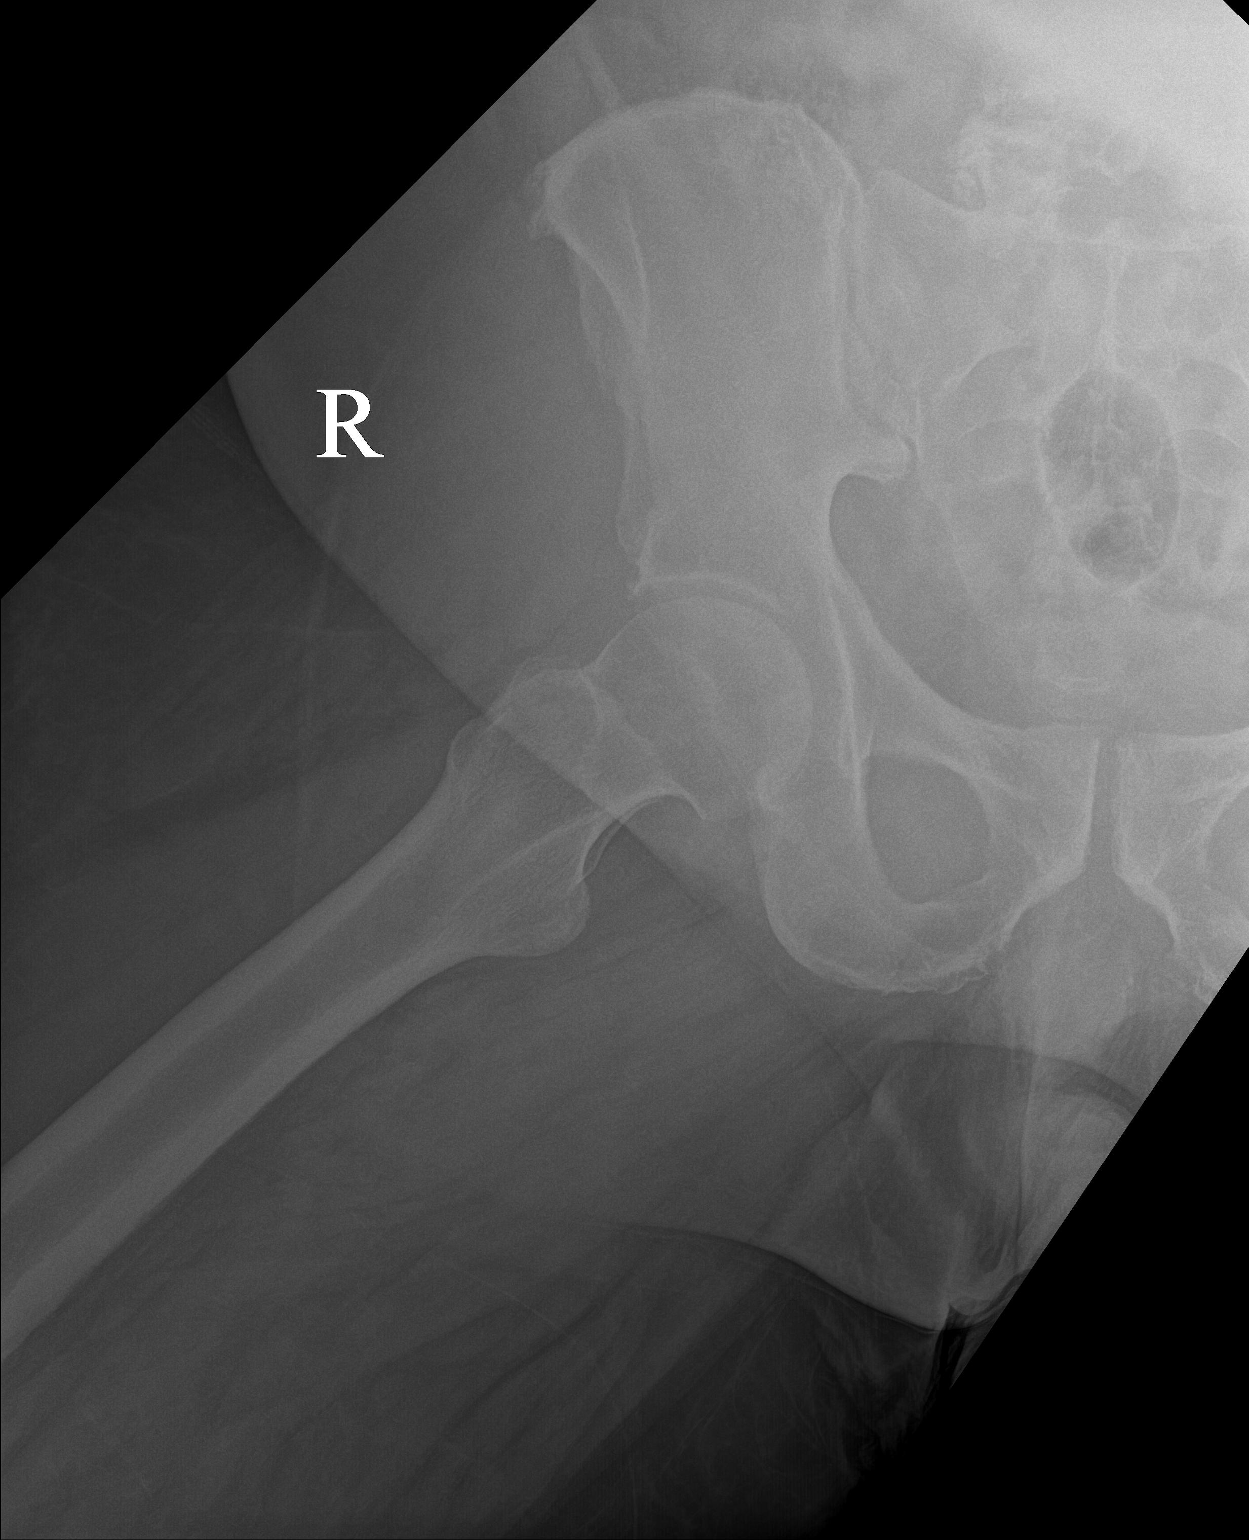

[3 of 3 positions shown; findings below may reference images not displayed]

FINDINGS: Total fluoro time 4 minutes 19 seconds

Dose: 257.2 mGy.

Images were performed intraoperatively without the presence of a
radiologist. There appears to be bilateral transpedicular rod and
screw fusion hardware at L4-5 with associated intervertebral disc
spacer.

Please see intraoperative findings for further detail.
IMPRESSION: Fluoroscopy for L4-5 posterior fusion.

ADDENDUM:
The wrong report was initially connected to this exam. The correct
report follows.
FINDINGS: There is diffuse decreased bone mineralization. Interval total left
hip arthroplasty. No perihardware lucency is seen to indicate
hardware failure or loosening however the distal aspect of the
femoral stem is not imaged.

Moderate left greater than right sacroiliac joint subchondral
sclerosis and joint space narrowing.

The pubic symphysis joint space is maintained.

The right femoroacetabular joint space is preserved. Minimal
superolateral right acetabular degenerative osteophytosis is similar
to prior.

No acute fracture or dislocation.
IMPRESSION: 1. Interval total left hip arthroplasty.
2. Only minimal right femoroacetabular osteoarthritis, similar to
prior.

*** End of Addendum ***
FINDINGS: Total fluoro time 4 minutes 19 seconds

Dose: 257.2 mGy.

Images were performed intraoperatively without the presence of a
radiologist. There appears to be bilateral transpedicular rod and
screw fusion hardware at L4-5 with associated intervertebral disc
spacer.

Please see intraoperative findings for further detail.
IMPRESSION: Fluoroscopy for L4-5 posterior fusion.

## 2021-10-05 MED ORDER — SOLIFENACIN SUCCINATE 5 MG PO TABS: 5.0000 mg | ORAL_TABLET | Freq: Every day | ORAL | 3 refills | Status: DC

## 2021-10-05 NOTE — Assessment & Plan Note (Signed)
BP Readings from Last 3 Encounters:  10/05/21 132/68  07/28/21 (!) 148/70  12/23/20 136/74   Stable, pt to continue medical treatment maxide

## 2021-10-05 NOTE — Progress Notes (Signed)
Patient ID: Michael Khan, male   DOB: 12/29/1955, 66 y.o.   MRN: [id number]         Chief Complaint:: wellness exam and acute on chronic LBP/right hip pain/groin and upper right leg pain, and urinary frequency       HPI:  Michael Khan is a 66 y.o. male here for wellness exam; due for eye exam, colonoscopy, declines covid booster, shingrix, pneumovax o/w up to date                        Also has hx of left AVN s/p left hip replacement and ongoing low back, right hip, groin and leg pain worse in the last 2-3 wks, mild to mod, intermittent, worse to stand and walk, no falls, concerned about AVN now to the right hip as seems similar.  No clear distal Right leg radicular pain weakness or numbness below the knee.  Better to lie down.  Also incidentally with worsening urinary frequency and urgency every 2-3 hrs sometimes small amounts, Denies urinary symptoms such as dysuria, flank pain, hematuria or n/v, fever, chills.     Wt Readings from Last 3 Encounters:  10/05/21 294 lb (133.4 kg)  07/28/21 282 lb (127.9 kg)  12/23/20 264 lb (119.7 kg)   BP Readings from Last 3 Encounters:  10/05/21 132/68  07/28/21 (!) 148/70  12/23/20 136/74   Immunization History  Administered Date(s) Administered   PFIZER(Purple Top)SARS-COV-2 Vaccination 11/28/2019, 12/23/2019, 06/23/2020   Health Maintenance Due  Topic Date Due   OPHTHALMOLOGY EXAM  10/09/2018   COLONOSCOPY (Pts 45-24yrs Insurance coverage will need to be confirmed)  05/16/2021      Past Medical History:  Diagnosis Date   Guillain Barr syndrome [facility])    When he was 51, he received a flu shot caused him to develop Guillain Barre Syndrome   Hyperlipemia    Hypothyroidism 07/24/2019   Measles    Past Surgical History:  Procedure Laterality Date   ANKLE RECONSTRUCTION     CHOLECYSTECTOMY     NOSE SURGERY     WRIST RECONSTRUCTION      reports that he has never smoked. He has never used smokeless tobacco. He reports that he does not  drink alcohol and does not use drugs. family history includes Healthy in his father; Liver cancer in his mother. Allergies  Allergen Reactions   Drug Class [Haemophilus Influenzae Vaccines] Other (See Comments)    Caused to not walk   Lipitor [Atorvastatin Calcium]     discomfort   Current Outpatient Medications on File Prior to Visit  Medication Sig Dispense Refill   aspirin 81 MG EC tablet Take 1 tablet (81 mg total) by mouth daily. Swallow whole. 30 tablet 12   Cholecalciferol 50 MCG (2000 UT) CAPS Take by mouth.     levothyroxine (SYNTHROID) 75 MCG tablet Take 1 tablet by mouth once daily 30 tablet 0   naproxen (NAPROSYN) 500 MG tablet Take 1 tablet (500 mg total) by mouth 2 (two) times daily as needed for moderate pain. 60 tablet 2   rosuvastatin (CRESTOR) 10 MG tablet TAKE 1 TABLET BY MOUTH ONCE DAILY . APPOINTMENT REQUIRED FOR FUTURE REFILLS 30 tablet 0   triamterene-hydrochlorothiazide (MAXZIDE) 75-50 MG tablet Take 1 tablet by mouth once daily 30 tablet 0   gentamicin cream (GARAMYCIN) 0.1 % Apply 1 application topically 2 (two) times daily. (Patient not taking: Reported on 07/28/2021) 30 g 1   pantoprazole (PROTONIX) 40  MG tablet Take 1 tablet (40 mg total) by mouth daily. 90 tablet 3   phentermine 37.5 MG capsule Take 1 capsule by mouth in the morning (Patient not taking: Reported on 07/28/2021) 30 capsule 0   No current facility-administered medications on file prior to visit.        ROS:  All others reviewed and negative.  Objective        PE:  BP 132/68 (BP Location: Right Arm, Patient Position: Sitting, Cuff Size: Large)    Pulse 90    Temp 98.5 F (36.9 C) (Oral)    Ht 5\' 8"  (1.727 m)    Wt 294 lb (133.4 kg)    SpO2 97%    BMI 44.70 kg/m                 Constitutional: Pt appears in NAD               HENT: Head: NCAT.                Right Ear: External ear normal.                 Left Ear: External ear normal.                Eyes: . Pupils are equal, round, and  reactive to light. Conjunctivae and EOM are normal               Nose: without d/c or deformity               Neck: Neck supple. Gross normal ROM               Cardiovascular: Normal rate and regular rhythm.                 Pulmonary/Chest: Effort normal and breath sounds without rales or wheezing.                Abd:  Soft, NT, ND, + BS, no organomegaly               Neurological: Pt is alert. At baseline orientation, motor grossly intact               Skin: Skin is warm. No rashes, no other new lesions, LE edema - none               Psychiatric: Pt behavior is normal without agitation   Micro: none  Cardiac tracings I have personally interpreted today:  none  Pertinent Radiological findings (summarize): none   Lab Results  Component Value Date   WBC 8.1 10/05/2021   HGB 13.7 10/05/2021   HCT 41.1 10/05/2021   PLT 234.0 10/05/2021   GLUCOSE 110 (H) 10/05/2021   CHOL 138 10/05/2021   TRIG 284.0 (H) 10/05/2021   HDL 36.60 (L) 10/05/2021   LDLDIRECT 79.0 10/05/2021   LDLCALC 52 10/02/2020   ALT 27 10/05/2021   AST 23 10/05/2021   NA 139 10/05/2021   K 3.6 10/05/2021   CL 102 10/05/2021   CREATININE 1.23 10/05/2021   BUN 27 (H) 10/05/2021   CO2 29 10/05/2021   TSH 4.62 10/05/2021   PSA 0.31 10/05/2021   INR 1.0 10/03/2008   HGBA1C 6.3 10/05/2021   MICROALBUR <0.7 10/05/2021   Assessment/Plan:  Michael Khan is a 66 y.o. Black or African American [2] male with  has a past medical history of Guillain Barr syndrome ([place]), Hyperlipemia, Hypothyroidism (07/24/2019), and Measles.  Low back pain Acute right without sciatica - etiology unclear, no falls, for f/u xray, f/u sport medicine  Encounter for routine adult medical exam with abnormal findings Age and sex appropriate education and counseling updated with regular exercise and diet Referrals for preventative services - due for eye exam and colonoscopy Immunizations addressed - declines covid booster, shingrix,  pneumovax Smoking counseling  - none needed Evidence for depression or other mood disorder - none significant Most recent labs reviewed. I have personally reviewed and have noted: 1) the patient's medical and social history 2) The patient's current medications and supplements 3) The patient's height, weight, and BMI have been recorded in the chart   B12 deficiency Lab Results  Component Value Date   VITAMINB12 >1504 (H) 10/05/2021   Stable, cont oral replacement - b12 1000 mcg qd   HLD (hyperlipidemia) Lab Results  Component Value Date   LDLCALC 52 10/02/2020   Stable, pt to continue current statin crestor   Hyperglycemia Lab Results  Component Value Date   HGBA1C 6.3 10/05/2021   Stable, pt to continue current medical treatment  - diet   Hypertension BP Readings from Last 3 Encounters:  10/05/21 132/68  07/28/21 (!) 148/70  12/23/20 136/74   Stable, pt to continue medical treatment maxide   Right groin pain Exam benign, for xray, f/u sport med  Right hip pain Etiology unclear, cant r/o avn right hip, for film, for MRI, consdier ortho referral  Urinary frequency Exam benign, for ua, likely OAB related, for vesicare 5 qd  Vitamin D deficiency Last vitamin D Lab Results  Component Value Date   VD25OH 44.82 10/05/2021   Stable, cont oral replacement  Followup: Return in about 6 months (around 04/04/2022).  Cathlean Cower, MD 10/05/2021 10:07 PM [facility]

## 2021-10-05 NOTE — Assessment & Plan Note (Signed)
Lab Results  Component Value Date   HGBA1C 6.3 10/05/2021   Stable, pt to continue current medical treatment  - diet

## 2021-10-05 NOTE — Patient Instructions (Addendum)
Please take all new medication as prescribed - the generic vesicare for bladder  Please continue all other medications as before, and refills have been done if requested.  Please have the pharmacy call with any other refills you may need.  Please continue your efforts at being more active, low cholesterol diet, and weight control.  You are otherwise up to date with prevention measures today.  Please keep your appointments with your specialists as you may have planned  Please go to the XRAY Department in the first floor for the x-ray testing - for the lower back, hip and pelvis  You will be contacted regarding the referral for: MRI for the right hip, the eye doctor, and colonoscopy  Please go to the LAB at the blood drawing area for the tests to be done  You will be contacted by phone if any changes need to be made immediately.  Otherwise, you will receive a letter about your results with an explanation, but please check with MyChart first.  Please remember to sign up for MyChart if you have not done so, as this will be important to you in the future with finding out test results, communicating by private email, and scheduling acute appointments online when needed.  Please make an Appointment to return in 6 months, or sooner if needed

## 2021-10-05 NOTE — Assessment & Plan Note (Signed)
Etiology unclear, cant r/o avn right hip, for film, for MRI, consdier ortho referral

## 2021-10-05 NOTE — Assessment & Plan Note (Addendum)
Acute right without sciatica - etiology unclear, no falls, for f/u xray, f/u sport medicine

## 2021-10-05 NOTE — Assessment & Plan Note (Signed)
Exam benign, for ua, likely OAB related, for vesicare 5 qd

## 2021-10-05 NOTE — Assessment & Plan Note (Signed)
Exam benign, for xray, f/u sport med

## 2021-10-05 NOTE — Assessment & Plan Note (Signed)
Lab Results  Component Value Date   LDLCALC 52 10/02/2020   Stable, pt to continue current statin crestor

## 2021-10-05 NOTE — Assessment & Plan Note (Signed)
Lab Results  Component Value Date   VITAMINB12 >1504 (H) 10/05/2021   Stable, cont oral replacement - b12 1000 mcg qd

## 2021-10-05 NOTE — Assessment & Plan Note (Signed)
Last vitamin D Lab Results  Component Value Date   VD25OH 44.82 10/05/2021   Stable, cont oral replacement  

## 2021-10-05 NOTE — Assessment & Plan Note (Signed)
Age and sex appropriate education and counseling updated with regular exercise and diet Referrals for preventative services - due for eye exam and colonoscopy Immunizations addressed - declines covid booster, shingrix, pneumovax Smoking counseling  - none needed Evidence for depression or other mood disorder - none significant Most recent labs reviewed. I have personally reviewed and have noted: 1) the patient's medical and social history 2) The patient's current medications and supplements 3) The patient's height, weight, and BMI have been recorded in the chart

## 2021-10-08 ENCOUNTER — Other Ambulatory Visit: Payer: Self-pay

## 2021-10-08 ENCOUNTER — Ambulatory Visit (INDEPENDENT_AMBULATORY_CARE_PROVIDER_SITE_OTHER): Payer: 59 | Admitting: Family Medicine

## 2021-10-08 VITALS — BP 152/88 | HR 87 | Ht 68.0 in | Wt 297.0 lb

## 2021-10-08 DIAGNOSIS — M5416 Radiculopathy, lumbar region: Secondary | ICD-10-CM | POA: Diagnosis not present

## 2021-10-08 DIAGNOSIS — R1031 Right lower quadrant pain: Secondary | ICD-10-CM

## 2021-10-08 MED ORDER — PREDNISONE 50 MG PO TABS: ORAL_TABLET | ORAL | 0 refills | Status: DC

## 2021-10-08 MED ORDER — GABAPENTIN 300 MG PO CAPS: 300.0000 mg | ORAL_CAPSULE | Freq: Three times a day (TID) | ORAL | 2 refills | Status: DC

## 2021-10-08 NOTE — Patient Instructions (Addendum)
Thank you for coming in today.   I've sent the medications to your pharmacy.  Hold off on the hip MRI  If not getting better, please let me know.

## 2021-10-08 NOTE — Progress Notes (Signed)
I, Peterson Lombard, LAT, ATC acting as a scribe for Lynne Leader, MD.  Michael Khan is a 66 y.o. male who presents to [place] at [facility] today for R hip and low back pain. Pt was previously seen by Dr. Georgina Snell on 6/30/21due to AVN, DJD and effusion and was referred to orthopedic surgery and had a L total hip replacement in March 2022. Today, pt c/o R hip and low back pain ongoing for 3-4 weeks. Pt woke up one morning w/ severe pain in his R buttock, which resolved. Today, pt locates pain to R side of the low back, into lateral hip, groin, w/ numbness in his R lower leg  Radiating pain: yes LE numbness/tingling: yes LE weakness: no Aggravates: walking, getting into car, hip flexion at end range Treatments tried: Tylenol, pain rx  Dx imaging: R hip MRI- ordered by PCP  10/05/21 R hip & L-spine XR   Pertinent review of systems: No fevers or chills  Relevant historical information: .  History of avascular necrosis of the left hip ultimately resolved with a left hip replacement.   Exam:  BP (!) 152/88    Pulse 87    Ht 5\' 8"  (1.727 m)    Wt 297 lb (134.7 kg)    SpO2 98%    BMI 45.16 kg/m  General: Well Developed, well nourished, and in no acute distress.   MSK: L-spine: Nontender midline. Decreased lumbar motion. Positive right-sided slump test.  Right hip normal-appearing normal hip motion. Intact strength right hip. Motion and strength is intact. Reflexes are intact distally.    Lab and Radiology Results  EXAM: LUMBAR SPINE - COMPLETE 4+ VIEW   COMPARISON:  01/16/2019   FINDINGS: No recent fracture is seen. Degenerative changes are noted with bony spurs and facet hypertrophy, more so in the lower lumbar spine. Surgical clips are seen in the right upper quadrant. There is previous left hip arthroplasty.   IMPRESSION: No recent fracture is seen. Lumbar spondylosis, more so in the lower lumbar spine.     Electronically Signed   By: Elmer Picker M.D.   On: 10/06/2021 20:45   I, Lynne Leader, personally (independently) visualized and performed the interpretation of the images attached in this note.  Hip x-ray images obtained on October 05, 2021 personally and independently interpreted today.  X-ray read is still pending. Well-appearing left hip replacement prosthesis with well-appearing hardware. No fractures are visible right hip. Minimal DJD.  No AVN.    Assessment and Plan: 66 y.o. male with   Right anterior thigh pain radiating down to the medial calf is consistent with L3 lumbar radiculopathy. His right groin pain is concerning for hip etiology especially given his AVN of his left hip history however his symptoms are much more consistent with lumbar radiculopathy at L3.  Plan to treat with course of prednisone and gabapentin.  Next step if not improving would probably be a lumbar spine MRI. His primary care provider has ordered an MRI of his right hip.  I think he can probably get hold off on that for now and if he had to pick an MRI I think lumbar spine MRI would probably be first choice but hip MRI is a reasonable choice especially given his AVN history.   PDMP not reviewed this encounter. No orders of the defined types were placed in this encounter.  Meds ordered this encounter  Medications   gabapentin (NEURONTIN) 300 MG capsule    Sig:  Take 1 capsule (300 mg total) by mouth 3 (three) times daily.    Dispense:  90 capsule    Refill:  2   predniSONE (DELTASONE) 50 MG tablet    Sig: Take 1 pill daily for 5 days    Dispense:  5 tablet    Refill:  0     Discussed warning signs or symptoms. Please see discharge instructions. Patient expresses understanding.   The above documentation has been reviewed and is accurate and complete Lynne Leader, M.D.

## 2021-10-15 ENCOUNTER — Other Ambulatory Visit: Payer: Self-pay | Admitting: Internal Medicine

## 2021-10-15 NOTE — Telephone Encounter (Signed)
Please refill as per office routine med refill policy (all routine meds to be refilled for 3 mo or monthly (per pt preference) up to one year from last visit, then month to month grace period for 3 mo, then further med refills will have to be denied) ? ?

## 2021-10-23 ENCOUNTER — Other Ambulatory Visit: Payer: Self-pay | Admitting: Internal Medicine

## 2021-10-23 NOTE — Telephone Encounter (Signed)
Please refill as per office routine med refill policy (all routine meds to be refilled for 3 mo or monthly (per pt preference) up to one year from last visit, then month to month grace period for 3 mo, then further med refills will have to be denied) ? ?

## 2021-11-02 ENCOUNTER — Other Ambulatory Visit: Payer: 59

## 2021-11-04 ENCOUNTER — Other Ambulatory Visit: Payer: Self-pay | Admitting: Internal Medicine

## 2021-11-04 ENCOUNTER — Other Ambulatory Visit: Payer: Self-pay

## 2021-11-04 ENCOUNTER — Ambulatory Visit
Admission: RE | Admit: 2021-11-04 | Discharge: 2021-11-04 | Disposition: A | Discharge status: Home / Self Care | Payer: 59 | Source: Ambulatory Visit | Attending: Internal Medicine | Admitting: Internal Medicine

## 2021-11-04 DIAGNOSIS — M545 Low back pain, unspecified: Secondary | ICD-10-CM

## 2021-11-04 DIAGNOSIS — M1611 Unilateral primary osteoarthritis, right hip: Secondary | ICD-10-CM | POA: Diagnosis not present

## 2021-11-04 DIAGNOSIS — M7061 Trochanteric bursitis, right hip: Secondary | ICD-10-CM | POA: Diagnosis not present

## 2021-11-04 DIAGNOSIS — R1031 Right lower quadrant pain: Secondary | ICD-10-CM

## 2021-11-04 DIAGNOSIS — M25551 Pain in right hip: Secondary | ICD-10-CM

## 2021-11-04 DIAGNOSIS — Z96642 Presence of left artificial hip joint: Secondary | ICD-10-CM | POA: Diagnosis not present

## 2021-11-04 IMAGING — MR MR HIP*R* W/O CM
5 series · 34 of 40 positions shown · non-contrast
Comparison: Pelvis and right hip radiographs [DATE]

CLINICAL DATA: Hip pain. Osteonecrosis suspected. Right groin and
hip pain.

EXAM:
MR OF THE RIGHT HIP WITHOUT CONTRAST
TECHNIQUE: Multiplanar, multisequence MR imaging was performed. No intravenous
contrast was administered.

[Series 7: T2 fat-sat · coronal · right · 3.0mm · 0.89mm/px · 8 of 30 slices shown (1 of 2)]
[im 1/30]
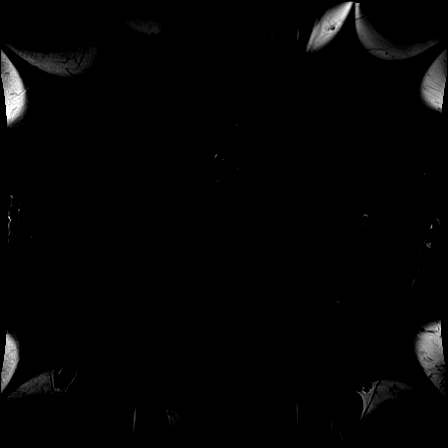
[im 5/30]
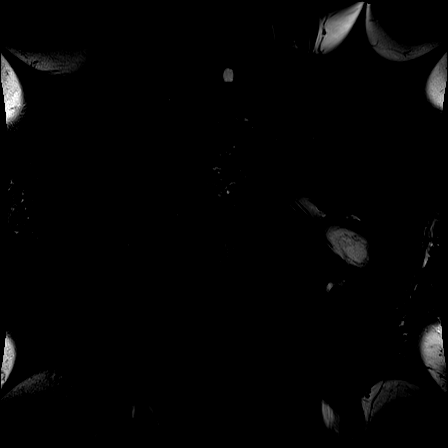
[im 9/30]
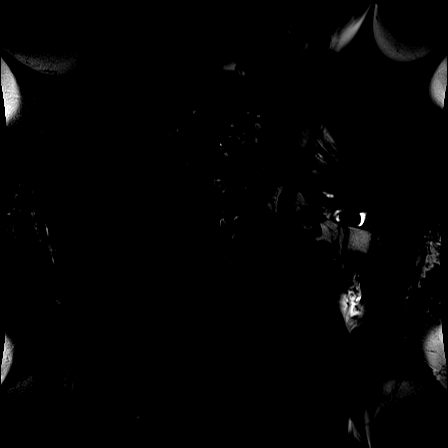
[im 13/30]
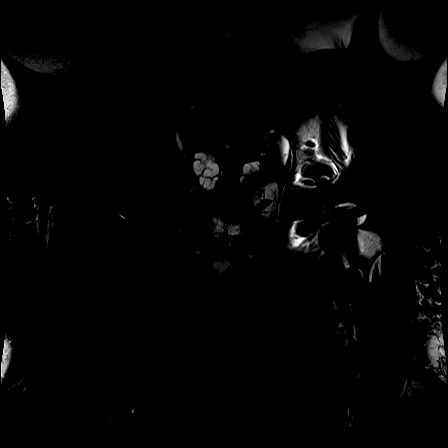
[im 17/30]
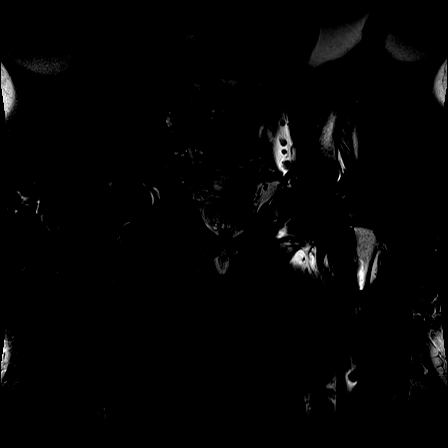
[im 21/30]
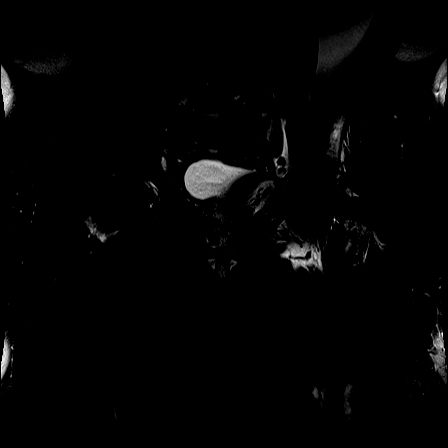
[im 25/30]
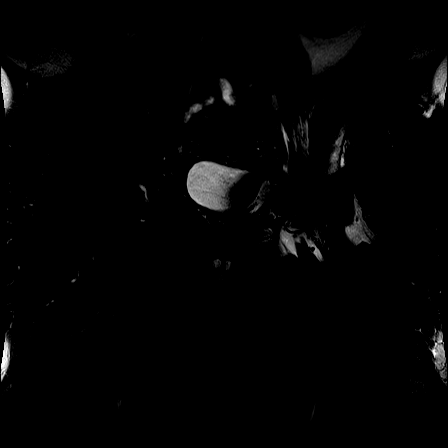
[im 30/30]
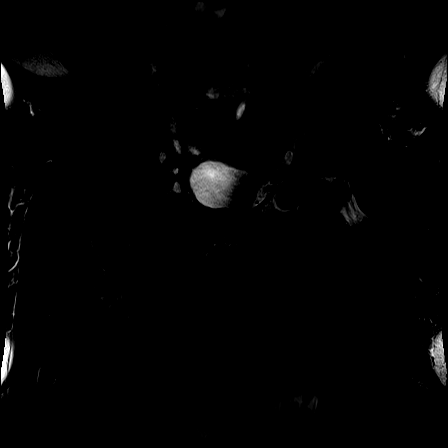

[Series 8: T1 · coronal · right · 3.0mm · 0.89mm/px · 2 of 36 slices shown]
[im 1/36]
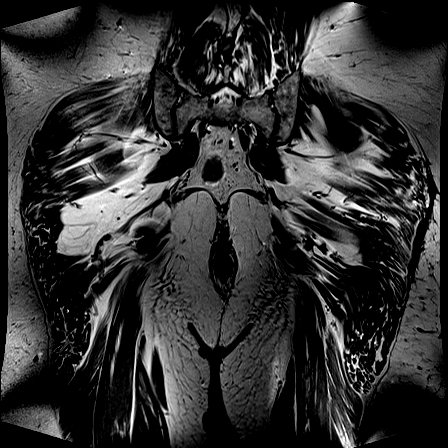
[im 6/36]
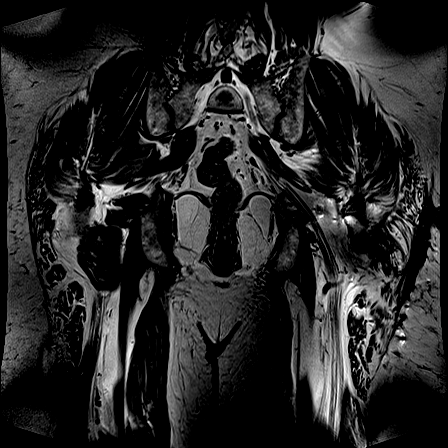

[Series 9: T2 fat-sat · axial · right · 4.0mm · 1.19mm/px · z∈[-147,+88]mm · 11 of 50 slices shown (2 of 2)]
[im 1/50]
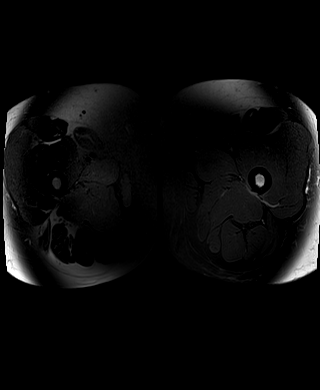
[im 5/50]
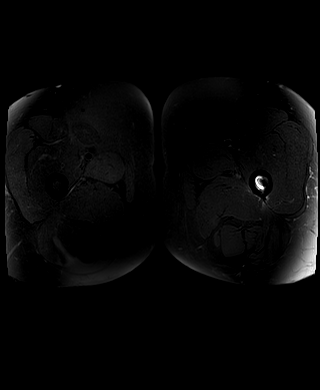
[im 10/50]
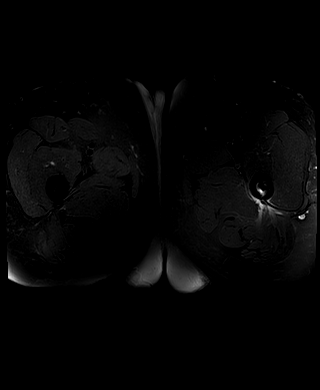
[im 15/50]
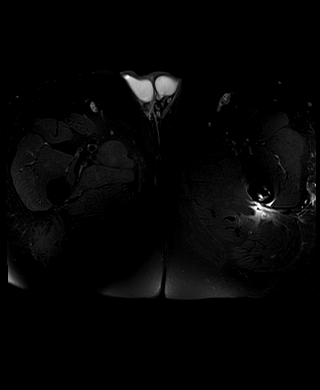
[im 20/50]
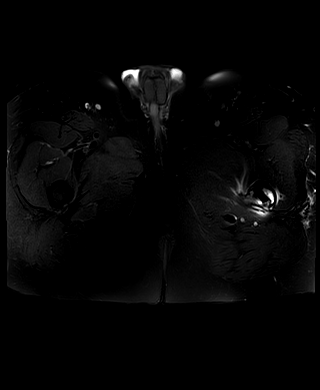
[im 25/50]
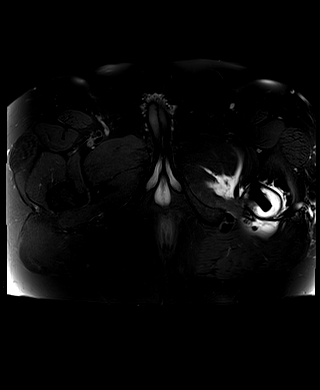
[im 30/50]
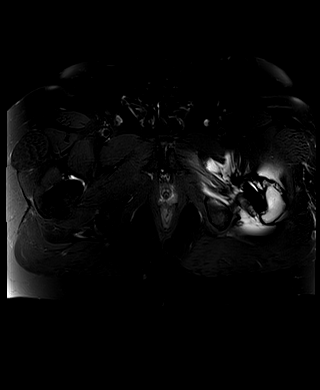
[im 35/50]
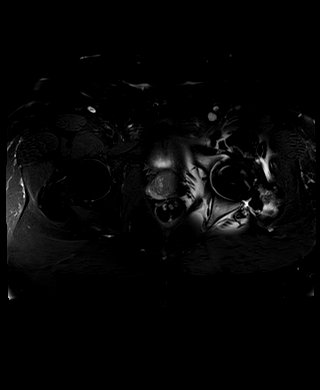
[im 40/50]
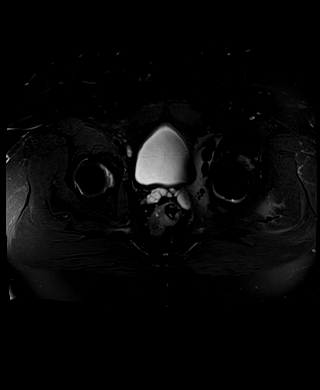
[im 45/50]
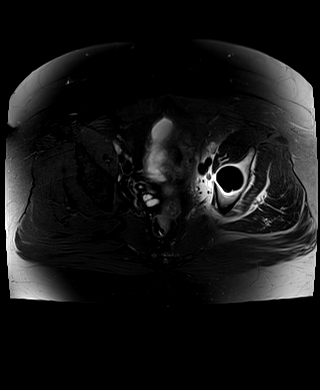
[im 50/50]
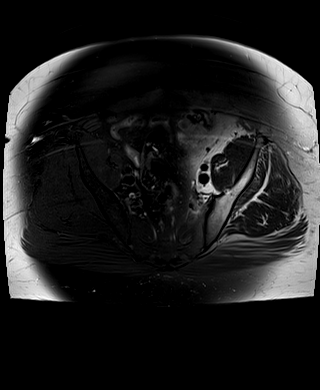

[Series 10: PD fat-sat · coronal · right · 3.0mm · 0.56mm/px · 6 of 28 slices shown (1 of 2)]
[im 1/28]
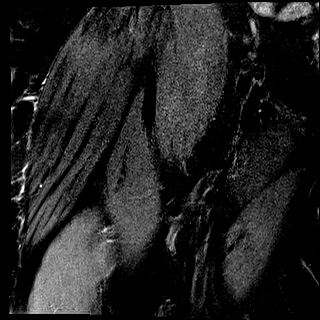
[im 6/28]
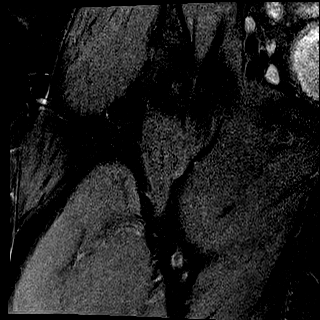
[im 11/28]
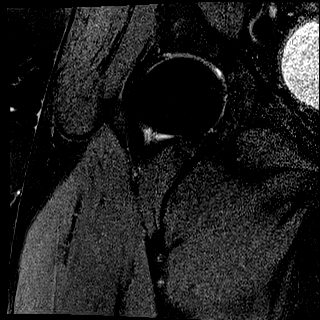
[im 17/28]
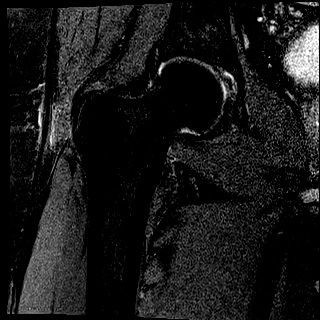
[im 22/28]
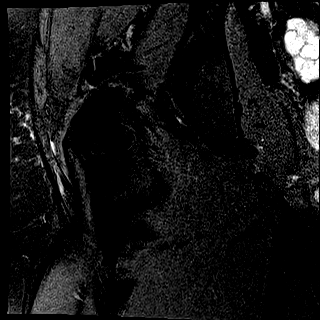
[im 28/28]
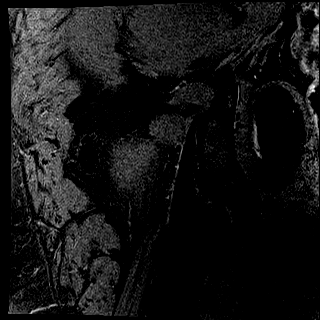

[Series 11: PD fat-sat · sagittal · right · 3.0mm · 0.56mm/px · 7 of 33 slices shown (2 of 2)]
[im 1/33]
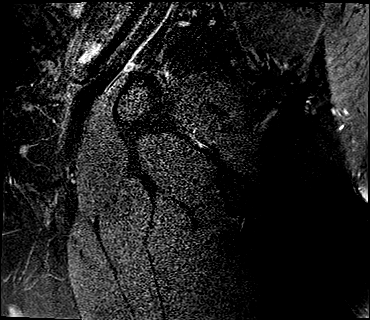
[im 6/33]
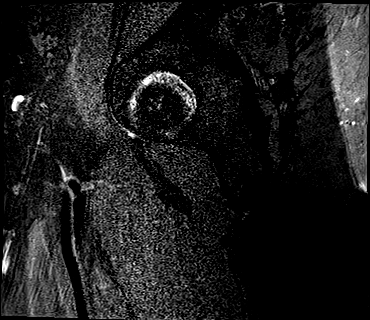
[im 11/33]
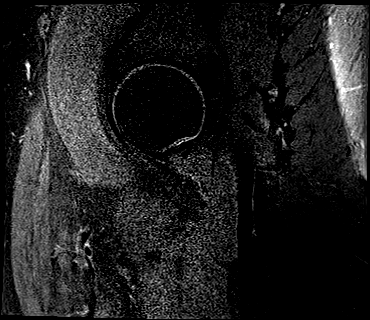
[im 17/33]
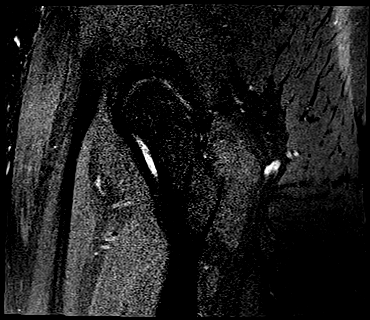
[im 22/33]
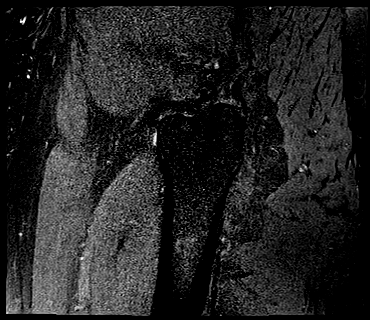
[im 27/33]
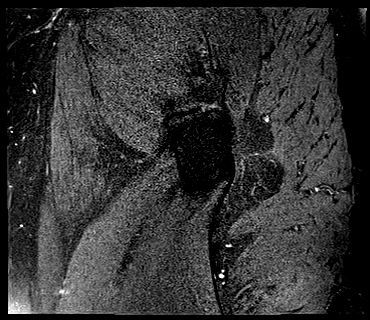
[im 33/33]
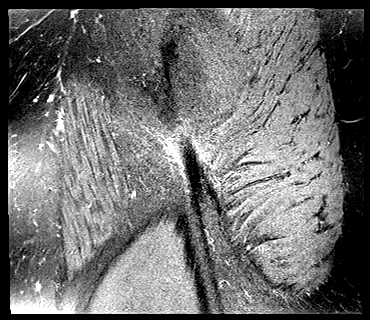

[34 of 40 positions shown; findings below may reference images not displayed]

FINDINGS: Bones: Metallic artifact from total left hip arthroplasty hardware
obscures the majority of the left proximal femur and portions of the
left hemipelvis.

No acute fracture or avascular necrosis is seen within the
visualized portions of the pelvis or either proximal femur.
Mild-to-moderate joint space narrowing and peripheral osteophytosis
degenerative changes of the pubic symphysis.

Right femoroacetabular joint:

Articular cartilage and labrum

Articular cartilage: Moderate thinning of the superior right femoral
head and acetabular cartilage greatest anteriorly.

Labrum: Mild degenerative irregularity and tearing of the anterior
superior right acetabular labrum.

Joint or bursal effusion

Joint effusion:  No right hip joint effusion.

Bursae: Trace fluid within the right trochanteric bursa.

Muscles and tendons

Muscles and tendons: The origins of the bilateral rectus femoris
tendons and the right common hamstring tendon origin are intact.
Minimal fluid bright signal at the posterior aspect of the left
common hamstring tendon origin possible minimal early
partial-thickness tear. The insertions of the bilateral gluteus
minimus, gluteus medius and iliopsoas tendons are intact.

Other findings

Miscellaneous:   Mild partially visualized sigmoid diverticulosis.
IMPRESSION: :
IMPRESSION: 1. Status post total left hip arthroplasty.
2. Mild-to-moderate right femoroacetabular cartilage degenerative
changes.
3. No acute fracture or avascular necrosis.
4. Minimal right trochanteric bursitis.
5. Tiny/punctate partial-thickness tear of the left common hamstring
tendon origin. No tendon retraction.

## 2021-11-04 NOTE — Telephone Encounter (Signed)
Please refill as per office routine med refill policy (all routine meds to be refilled for 3 mo or monthly (per pt preference) up to one year from last visit, then month to month grace period for 3 mo, then further med refills will have to be denied) ? ?

## 2021-12-29 ENCOUNTER — Other Ambulatory Visit: Payer: Self-pay | Admitting: Internal Medicine

## 2021-12-29 NOTE — Telephone Encounter (Signed)
Please refill as per office routine med refill policy (all routine meds to be refilled for 3 mo or monthly (per pt preference) up to one year from last visit, then month to month grace period for 3 mo, then further med refills will have to be denied) ? ?

## 2022-01-14 ENCOUNTER — Other Ambulatory Visit: Payer: Self-pay | Admitting: Family Medicine

## 2022-02-03 DIAGNOSIS — Z833 Family history of diabetes mellitus: Secondary | ICD-10-CM | POA: Diagnosis not present

## 2022-02-03 DIAGNOSIS — N3281 Overactive bladder: Secondary | ICD-10-CM | POA: Diagnosis not present

## 2022-02-03 DIAGNOSIS — E039 Hypothyroidism, unspecified: Secondary | ICD-10-CM | POA: Diagnosis not present

## 2022-02-03 DIAGNOSIS — E785 Hyperlipidemia, unspecified: Secondary | ICD-10-CM | POA: Diagnosis not present

## 2022-02-03 DIAGNOSIS — I10 Essential (primary) hypertension: Secondary | ICD-10-CM | POA: Diagnosis not present

## 2022-02-03 DIAGNOSIS — K219 Gastro-esophageal reflux disease without esophagitis: Secondary | ICD-10-CM | POA: Diagnosis not present

## 2022-02-03 DIAGNOSIS — G629 Polyneuropathy, unspecified: Secondary | ICD-10-CM | POA: Diagnosis not present

## 2022-02-03 DIAGNOSIS — Z809 Family history of malignant neoplasm, unspecified: Secondary | ICD-10-CM | POA: Diagnosis not present

## 2022-02-03 DIAGNOSIS — Z6841 Body Mass Index (BMI) 40.0 and over, adult: Secondary | ICD-10-CM | POA: Diagnosis not present

## 2022-02-03 DIAGNOSIS — Z008 Encounter for other general examination: Secondary | ICD-10-CM | POA: Diagnosis not present

## 2022-02-15 DIAGNOSIS — H25813 Combined forms of age-related cataract, bilateral: Secondary | ICD-10-CM | POA: Diagnosis not present

## 2022-02-15 DIAGNOSIS — H02836 Dermatochalasis of left eye, unspecified eyelid: Secondary | ICD-10-CM | POA: Diagnosis not present

## 2022-02-15 DIAGNOSIS — H35032 Hypertensive retinopathy, left eye: Secondary | ICD-10-CM | POA: Diagnosis not present

## 2022-02-15 DIAGNOSIS — H02833 Dermatochalasis of right eye, unspecified eyelid: Secondary | ICD-10-CM | POA: Diagnosis not present

## 2022-02-17 ENCOUNTER — Other Ambulatory Visit: Payer: Self-pay | Admitting: Family Medicine

## 2022-02-17 NOTE — Telephone Encounter (Signed)
Rx refill request approved per Dr. Corey's orders. 

## 2022-03-25 ENCOUNTER — Other Ambulatory Visit: Payer: Self-pay | Admitting: Family Medicine

## 2022-04-14 ENCOUNTER — Encounter: Payer: Self-pay | Admitting: Internal Medicine

## 2022-04-14 ENCOUNTER — Ambulatory Visit (INDEPENDENT_AMBULATORY_CARE_PROVIDER_SITE_OTHER): Payer: 59 | Admitting: Internal Medicine

## 2022-04-14 VITALS — BP 144/92 | HR 72 | Temp 97.8°F | Ht 68.0 in | Wt 288.0 lb

## 2022-04-14 DIAGNOSIS — R739 Hyperglycemia, unspecified: Secondary | ICD-10-CM | POA: Diagnosis not present

## 2022-04-14 DIAGNOSIS — E7800 Pure hypercholesterolemia, unspecified: Secondary | ICD-10-CM | POA: Diagnosis not present

## 2022-04-14 DIAGNOSIS — E559 Vitamin D deficiency, unspecified: Secondary | ICD-10-CM

## 2022-04-14 DIAGNOSIS — J309 Allergic rhinitis, unspecified: Secondary | ICD-10-CM | POA: Diagnosis not present

## 2022-04-14 MED ORDER — PREDNISONE 10 MG PO TABS: ORAL_TABLET | ORAL | 0 refills | Status: DC

## 2022-04-14 NOTE — Assessment & Plan Note (Signed)
Lab Results  Component Value Date   LDLCALC 52 10/02/2020   Stable, pt to continue current statin crestor 10 mg qd

## 2022-04-14 NOTE — Patient Instructions (Signed)
Please take all new medication as prescribed - the prednisone  Please continue all other medications as before, and refills have been done if requested.  Please have the pharmacy call with any other refills you may need.  Please continue your efforts at being more active, low cholesterol diet, and weight control.  Please keep your appointments with your specialists as you may have planned  No further lab work needed today  Please make an Appointment to return in 6 months, or sooner if needed, also with Lab Appointment for testing done 3-5 days before at the [facility] (so this is for TWO appointments - please see the scheduling desk as you leave)

## 2022-04-14 NOTE — Progress Notes (Signed)
Patient ID: Michael Khan, male   DOB: 04/17/1956, 66 y.o.   MRN: [id number]        Chief Complaint: follow up HTN, HLD and hyperglycemia , allergies       HPI:  Michael Khan is a 66 y.o. male here with have several wks ongoing nasal allergy symptoms with clearish congestion, itch and sneezing and prod cough clear mucous, without fever, pain, ST, swelling or wheezing.  COVID neg 3 days ago at home.  Pt denies chest pain, increased sob or doe, wheezing, orthopnea, PND, increased LE swelling, palpitations, dizziness or syncope.   Pt denies polydipsia, polyuria, or new focal neuro s/s.     Wt Readings from Last 3 Encounters:  04/14/22 288 lb (130.6 kg)  10/08/21 297 lb (134.7 kg)  10/05/21 294 lb (133.4 kg)   BP Readings from Last 3 Encounters:  04/14/22 (!) 144/92  10/08/21 (!) 152/88  10/05/21 132/68         Past Medical History:  Diagnosis Date   Guillain Barr syndrome [facility])    When he was 44, he received a flu shot caused him to develop Guillain Barre Syndrome   Hyperlipemia    Hypothyroidism 07/24/2019   Measles    Past Surgical History:  Procedure Laterality Date   ANKLE RECONSTRUCTION     CHOLECYSTECTOMY     NOSE SURGERY     WRIST RECONSTRUCTION      reports that he has never smoked. He has never used smokeless tobacco. He reports that he does not drink alcohol and does not use drugs. family history includes Healthy in his father; Liver cancer in his mother. Allergies  Allergen Reactions   Drug Class [Haemophilus Influenzae Vaccines] Other (See Comments)    Caused to not walk   Lipitor [Atorvastatin Calcium]     discomfort   Current Outpatient Medications on File Prior to Visit  Medication Sig Dispense Refill   aspirin 81 MG EC tablet Take 1 tablet (81 mg total) by mouth daily. Swallow whole. 30 tablet 12   Cholecalciferol 50 MCG (2000 UT) CAPS Take by mouth.     gabapentin (NEURONTIN) 300 MG capsule TAKE 1 CAPSULE BY MOUTH THREE TIMES DAILY 90 capsule 0    gentamicin cream (GARAMYCIN) 0.1 % Apply 1 application topically 2 (two) times daily. 30 g 1   levothyroxine (SYNTHROID) 75 MCG tablet Take 1 tablet by mouth once daily 90 tablet 3   naproxen (NAPROSYN) 500 MG tablet Take 1 tablet (500 mg total) by mouth 2 (two) times daily as needed for moderate pain. 60 tablet 2   pantoprazole (PROTONIX) 40 MG tablet Take 1 tablet by mouth once daily 90 tablet 2   phentermine 37.5 MG capsule Take 1 capsule by mouth in the morning 30 capsule 0   rosuvastatin (CRESTOR) 10 MG tablet TAKE 1 TABLET BY MOUTH ONCE DAILY . APPOINTMENT REQUIRED FOR FUTURE REFILLS 90 tablet 3   solifenacin (VESICARE) 5 MG tablet Take 1 tablet (5 mg total) by mouth daily. 90 tablet 3   triamterene-hydrochlorothiazide (MAXZIDE) 75-50 MG tablet Take 1 tablet by mouth once daily 90 tablet 3   No current facility-administered medications on file prior to visit.        ROS:  All others reviewed and negative.  Objective        PE:  BP (!) 144/92 (BP Location: Right Arm, Patient Position: Sitting, Cuff Size: Large)   Pulse 72   Temp 97.8 F (36.6 C) (Oral)  Ht '5\' 8"'$  (1.727 m)   Wt 288 lb (130.6 kg)   SpO2 96%   BMI 43.79 kg/m                 Constitutional: Pt appears in NAD               HENT: Head: NCAT.                Right Ear: External ear normal.                 Left Ear: External ear normal. Bilat tm's with mild erythema.  Max sinus areas non tender.  Pharynx with mild erythema, no exudate               Eyes: . Pupils are equal, round, and reactive to light. Conjunctivae and EOM are normal               Nose: without d/c or deformity               Neck: Neck supple. Gross normal ROM               Cardiovascular: Normal rate and regular rhythm.                 Pulmonary/Chest: Effort normal and breath sounds without rales or wheezing.                Abd:  Soft, NT, ND, + BS, no organomegaly               Neurological: Pt is alert. At baseline orientation, motor grossly  intact               Skin: Skin is warm. No rashes, no other new lesions, LE edema - none               Psychiatric: Pt behavior is normal without agitation   Micro: none  Cardiac tracings I have personally interpreted today:  none  Pertinent Radiological findings (summarize): none   Lab Results  Component Value Date   WBC 8.1 10/05/2021   HGB 13.7 10/05/2021   HCT 41.1 10/05/2021   PLT 234.0 10/05/2021   GLUCOSE 110 (H) 10/05/2021   CHOL 138 10/05/2021   TRIG 284.0 (H) 10/05/2021   HDL 36.60 (L) 10/05/2021   LDLDIRECT 79.0 10/05/2021   LDLCALC 52 10/02/2020   ALT 27 10/05/2021   AST 23 10/05/2021   NA 139 10/05/2021   K 3.6 10/05/2021   CL 102 10/05/2021   CREATININE 1.23 10/05/2021   BUN 27 (H) 10/05/2021   CO2 29 10/05/2021   TSH 4.62 10/05/2021   PSA 0.31 10/05/2021   INR 1.0 10/03/2008   HGBA1C 6.3 10/05/2021   MICROALBUR <0.7 10/05/2021   Assessment/Plan:  Michael Khan is a 66 y.o. Black or African American [2] male with  has a past medical history of Guillain Barr syndrome ([place]), Hyperlipemia, Hypothyroidism (07/24/2019), and Measles.  Allergic rhinitis unocntrolled with seasonal flare - for predniosone taper, add claritin 10 qd prn,  to f/u any worsening symptoms or concerns  HLD (hyperlipidemia) Lab Results  Component Value Date   LDLCALC 52 10/02/2020   Stable, pt to continue current statin crestor 10 mg qd   Hyperglycemia Lab Results  Component Value Date   HGBA1C 6.3 10/05/2021   Stable, pt to continue current medical treatment  - diet, excericse, wt control   Vitamin D deficiency Last vitamin D Lab Results  Component Value Date   VD25OH 44.82 10/05/2021   Stable, cont oral replacement  Followup: Return in about 6 months (around 10/15/2022).  Cathlean Cower, MD 04/14/2022 12:05 PM [facility]

## 2022-04-14 NOTE — Assessment & Plan Note (Signed)
Lab Results  Component Value Date   HGBA1C 6.3 10/05/2021   Stable, pt to continue current medical treatment  - diet, excericse, wt control

## 2022-04-14 NOTE — Assessment & Plan Note (Signed)
Last vitamin D Lab Results  Component Value Date   VD25OH 44.82 10/05/2021   Stable, cont oral replacement  

## 2022-04-14 NOTE — Assessment & Plan Note (Signed)
unocntrolled with seasonal flare - for predniosone taper, add claritin 10 qd prn,  to f/u any worsening symptoms or concerns

## 2022-04-28 ENCOUNTER — Other Ambulatory Visit: Payer: Self-pay | Admitting: Family Medicine

## 2022-04-29 NOTE — Telephone Encounter (Signed)
Rx refill request approved per Dr. Corey's orders. 

## 2022-06-04 ENCOUNTER — Other Ambulatory Visit: Payer: Self-pay | Admitting: Family Medicine

## 2022-06-06 NOTE — Telephone Encounter (Signed)
Rx refill request approved per Dr. Corey's orders. 

## 2022-07-13 ENCOUNTER — Ambulatory Visit: Payer: 59 | Admitting: Internal Medicine

## 2022-07-14 ENCOUNTER — Other Ambulatory Visit: Payer: Self-pay | Admitting: Family Medicine

## 2022-07-15 ENCOUNTER — Ambulatory Visit (INDEPENDENT_AMBULATORY_CARE_PROVIDER_SITE_OTHER): Payer: 59 | Admitting: Internal Medicine

## 2022-07-15 VITALS — BP 136/84 | HR 81 | Temp 98.1°F | Ht 68.0 in | Wt 307.0 lb

## 2022-07-15 DIAGNOSIS — E7800 Pure hypercholesterolemia, unspecified: Secondary | ICD-10-CM

## 2022-07-15 DIAGNOSIS — R739 Hyperglycemia, unspecified: Secondary | ICD-10-CM | POA: Diagnosis not present

## 2022-07-15 DIAGNOSIS — N529 Male erectile dysfunction, unspecified: Secondary | ICD-10-CM | POA: Diagnosis not present

## 2022-07-15 DIAGNOSIS — I10 Essential (primary) hypertension: Secondary | ICD-10-CM

## 2022-07-15 DIAGNOSIS — M25511 Pain in right shoulder: Secondary | ICD-10-CM

## 2022-07-15 DIAGNOSIS — E559 Vitamin D deficiency, unspecified: Secondary | ICD-10-CM

## 2022-07-15 DIAGNOSIS — M7989 Other specified soft tissue disorders: Secondary | ICD-10-CM | POA: Diagnosis not present

## 2022-07-15 DIAGNOSIS — M545 Low back pain, unspecified: Secondary | ICD-10-CM

## 2022-07-15 LAB — CBC WITH DIFFERENTIAL/PLATELET
Basophils Absolute: 0.1 10*3/uL (ref 0.0–0.1)
Basophils Relative: 0.7 % (ref 0.0–3.0)
Eosinophils Absolute: 0.3 10*3/uL (ref 0.0–0.7)
Eosinophils Relative: 3.4 % (ref 0.0–5.0)
HCT: 41.7 % (ref 39.0–52.0)
Hemoglobin: 14 g/dL (ref 13.0–17.0)
Lymphocytes Relative: 25.1 % (ref 12.0–46.0)
Lymphs Abs: 2 10*3/uL (ref 0.7–4.0)
MCHC: 33.5 g/dL (ref 30.0–36.0)
MCV: 91.7 fl (ref 78.0–100.0)
Monocytes Absolute: 1.1 10*3/uL — ABNORMAL HIGH (ref 0.1–1.0)
Monocytes Relative: 13.4 % — ABNORMAL HIGH (ref 3.0–12.0)
Neutro Abs: 4.6 10*3/uL (ref 1.4–7.7)
Neutrophils Relative %: 57.4 % (ref 43.0–77.0)
Platelets: 236 10*3/uL (ref 150.0–400.0)
RBC: 4.55 Mil/uL (ref 4.22–5.81)
RDW: 14.2 % (ref 11.5–15.5)
WBC: 8 10*3/uL (ref 4.0–10.5)

## 2022-07-15 LAB — BASIC METABOLIC PANEL
BUN: 26 mg/dL — ABNORMAL HIGH (ref 6–23)
CO2: 28 mEq/L (ref 19–32)
Calcium: 9.5 mg/dL (ref 8.4–10.5)
Chloride: 102 mEq/L (ref 96–112)
Creatinine, Ser: 1.45 mg/dL (ref 0.40–1.50)
GFR: 49.99 mL/min — ABNORMAL LOW (ref >60.00)
Glucose, Bld: 103 mg/dL — ABNORMAL HIGH (ref 70–99)
Potassium: 4 mEq/L (ref 3.5–5.1)
Sodium: 136 mEq/L (ref 135–145)

## 2022-07-15 LAB — HEPATIC FUNCTION PANEL
ALT: 27 U/L (ref 0–53)
AST: 29 U/L (ref 0–37)
Albumin: 4 g/dL (ref 3.5–5.2)
Alkaline Phosphatase: 58 U/L (ref 39–117)
Bilirubin, Direct: 0.1 mg/dL (ref 0.0–0.3)
Total Bilirubin: 0.7 mg/dL (ref 0.2–1.2)
Total Protein: 7.3 g/dL (ref 6.0–8.3)

## 2022-07-15 LAB — VITAMIN D 25 HYDROXY (VIT D DEFICIENCY, FRACTURES): VITD: 99.4 ng/mL (ref 30.00–100.00)

## 2022-07-15 LAB — BRAIN NATRIURETIC PEPTIDE: Pro B Natriuretic peptide (BNP): 96 pg/mL (ref 0.0–100.0)

## 2022-07-15 MED ORDER — FUROSEMIDE 20 MG PO TABS: 20.0000 mg | ORAL_TABLET | Freq: Every day | ORAL | 11 refills | Status: DC | PRN

## 2022-07-15 MED ORDER — SILDENAFIL CITRATE 100 MG PO TABS: 50.0000 mg | ORAL_TABLET | Freq: Every day | ORAL | 11 refills | Status: AC | PRN

## 2022-07-15 MED ORDER — CETIRIZINE HCL 10 MG PO TABS: 10.0000 mg | ORAL_TABLET | Freq: Every day | ORAL | 11 refills | Status: DC

## 2022-07-15 MED ORDER — TRIAMCINOLONE ACETONIDE 55 MCG/ACT NA AERO: 2.0000 | INHALATION_SPRAY | Freq: Every day | NASAL | 12 refills | Status: AC

## 2022-07-15 MED ORDER — TRAMADOL HCL 50 MG PO TABS
50.0000 mg | ORAL_TABLET | Freq: Four times a day (QID) | ORAL | 0 refills | Status: DC | PRN
Start: 2022-07-15 — End: 2022-08-31

## 2022-07-15 NOTE — Assessment & Plan Note (Signed)
Last vitamin D Lab Results  Component Value Date   VD25OH 44.82 10/05/2021   Stable, cont oral replacement

## 2022-07-15 NOTE — Assessment & Plan Note (Signed)
Lab Results  Component Value Date   LDLCALC 52 10/02/2020   Stable, pt to continue current statin crestor 20 mg qd

## 2022-07-15 NOTE — Patient Instructions (Signed)
Please take all new medication as prescribed - the lasix 20 mg only as needed, since you already take the maxide  Please take all new medication as prescribed  - the viagra as needed  Please take all new medication as prescribed - the OTC Zyrtec and Nasacort as needed  Please take all new medication as prescribed - the tramadol as needed for pain (and call for refill if needefd)  Please continue all other medications as before, and refills have been done if requested.  Please have the pharmacy call with any other refills you may need.  Please continue your efforts at being more active, low cholesterol diet, and weight control.  Please keep your appointments with your specialists as you may have planned  You will be contacted regarding the referral for: MRI lumbar spine, and Sports Medicine for the lower back and right shoulder  Please go to the LAB at the blood drawing area for the tests to be done  You will be contacted by phone if any changes need to be made immediately.  Otherwise, you will receive a letter about your results with an explanation, but please check with MyChart first.  Please remember to sign up for MyChart if you have not done so, as this will be important to you in the future with finding out test results, communicating by private email, and scheduling acute appointments online when needed.  Please make an Appointment to return in 3 months, or sooner if needed

## 2022-07-15 NOTE — Progress Notes (Signed)
Patient ID: Michael Khan, male   DOB: 24-Nov-1955, 66 y.o.   MRN: [id number]        Chief Complaint: follow up HTN, HLD and hyperglycemia, left lower back pain, bilateral leg swelling, worsening ED, allergies, low vit d       HPI:  Michael Khan is a 66 y.o. male here with c/o 3 mo persisent recurring moderate left lower back pain with radiation to the buttock; has hx of right sciatica pain not active now  Gained several lbs, not [facility] to [facility] as he has 69yo grandson to take care off full time     Does have several wks ongoing nasal allergy symptoms with clearish congestion, itch and sneezing, without fever, pain, ST, cough, swelling or wheezing, except has had 1 wk onset bilateral leg swelling with the wt gain for unclear reason .  Also has worsening ED symptoms, new girlfriend, asks for viagra.   Wt Readings from Last 3 Encounters:  07/15/22 (!) 307 lb (139.3 kg)  04/14/22 288 lb (130.6 kg)  10/08/21 297 lb (134.7 kg)   BP Readings from Last 3 Encounters:  07/15/22 136/84  04/14/22 (!) 144/92  10/08/21 (!) 152/88         Past Medical History:  Diagnosis Date   Guillain Barr syndrome [facility])    When he was 73, he received a flu shot caused him to develop Guillain Barre Syndrome   Hyperlipemia    Hypothyroidism 07/24/2019   Measles    Past Surgical History:  Procedure Laterality Date   ANKLE RECONSTRUCTION     CHOLECYSTECTOMY     NOSE SURGERY     WRIST RECONSTRUCTION      reports that he has never smoked. He has never used smokeless tobacco. He reports that he does not drink alcohol and does not use drugs. family history includes Healthy in his father; Liver cancer in his mother. Allergies  Allergen Reactions   Drug Class [Haemophilus Influenzae Vaccines] Other (See Comments)    Caused to not walk   Lipitor [Atorvastatin Calcium]     discomfort   Current Outpatient Medications on File Prior to Visit  Medication Sig Dispense Refill   aspirin 81 MG EC tablet Take 1 tablet (81 mg  total) by mouth daily. Swallow whole. 30 tablet 12   Cholecalciferol 50 MCG (2000 UT) CAPS Take by mouth.     gabapentin (NEURONTIN) 300 MG capsule TAKE 1 CAPSULE BY MOUTH THREE TIMES DAILY 90 capsule 0   gentamicin cream (GARAMYCIN) 0.1 % Apply 1 application topically 2 (two) times daily. 30 g 1   levothyroxine (SYNTHROID) 75 MCG tablet Take 1 tablet by mouth once daily 90 tablet 3   naproxen (NAPROSYN) 500 MG tablet Take 1 tablet (500 mg total) by mouth 2 (two) times daily as needed for moderate pain. 60 tablet 2   pantoprazole (PROTONIX) 40 MG tablet Take 1 tablet by mouth once daily 90 tablet 2   phentermine 37.5 MG capsule Take 1 capsule by mouth in the morning 30 capsule 0   predniSONE (DELTASONE) 10 MG tablet 3 tabs by mouth per day for 3 days,2tabs per day for 3 days,1tab per day for 3 days 18 tablet 0   rosuvastatin (CRESTOR) 10 MG tablet TAKE 1 TABLET BY MOUTH ONCE DAILY . APPOINTMENT REQUIRED FOR FUTURE REFILLS 90 tablet 3   solifenacin (VESICARE) 5 MG tablet Take 1 tablet (5 mg total) by mouth daily. 90 tablet 3   triamterene-hydrochlorothiazide (MAXZIDE) 75-50 MG  tablet Take 1 tablet by mouth once daily 90 tablet 3   No current facility-administered medications on file prior to visit.        ROS:  All others reviewed and negative.  Objective        PE:  BP 136/84 (BP Location: Right Arm, Patient Position: Sitting, Cuff Size: Large)   Pulse 81   Temp 98.1 F (36.7 C) (Oral)   Ht '5\' 8"'$  (1.727 m)   Wt (!) 307 lb (139.3 kg)   SpO2 94%   BMI 46.68 kg/m                 Constitutional: Pt appears in NAD               HENT: Head: NCAT.                Right Ear: External ear normal.                 Left Ear: External ear normal.                Eyes: . Pupils are equal, round, and reactive to light. Conjunctivae and EOM are normal               Nose: without d/c or deformity               Neck: Neck supple. Gross normal ROM               Cardiovascular: Normal rate and regular  rhythm.                 Pulmonary/Chest: Effort normal and breath sounds without rales or wheezing.                Abd:  Soft, NT, ND, + BS, no organomegaly               Neurological: Pt is alert. At baseline orientation, motor grossly intact               Skin: Skin is warm. No rashes, no other new lesions, LE edema - none               Psychiatric: Pt behavior is normal without agitation   Micro: none  Cardiac tracings I have personally interpreted today:  none  Pertinent Radiological findings (summarize): none   Lab Results  Component Value Date   WBC 8.1 10/05/2021   HGB 13.7 10/05/2021   HCT 41.1 10/05/2021   PLT 234.0 10/05/2021   GLUCOSE 110 (H) 10/05/2021   CHOL 138 10/05/2021   TRIG 284.0 (H) 10/05/2021   HDL 36.60 (L) 10/05/2021   LDLDIRECT 79.0 10/05/2021   LDLCALC 52 10/02/2020   ALT 27 10/05/2021   AST 23 10/05/2021   NA 139 10/05/2021   K 3.6 10/05/2021   CL 102 10/05/2021   CREATININE 1.23 10/05/2021   BUN 27 (H) 10/05/2021   CO2 29 10/05/2021   TSH 4.62 10/05/2021   PSA 0.31 10/05/2021   INR 1.0 10/03/2008   HGBA1C 6.3 10/05/2021   MICROALBUR <0.7 10/05/2021   Assessment/Plan:  Michael Khan is a 66 y.o. Black or African American [2] male with  has a past medical history of Guillain Barr syndrome ([place]), Hyperlipemia, Hypothyroidism (07/24/2019), and Measles.  Vitamin D deficiency Last vitamin D Lab Results  Component Value Date   VD25OH 44.82 10/05/2021   Stable, cont oral replacement   Low back pain Chronic persistent worsening >  6 wks, has had no prior imaging, for MRI LS spine, refer sports medicine, and tramadol prn  Hypertension BP Readings from Last 3 Encounters:  07/15/22 136/84  04/14/22 (!) 144/92  10/08/21 (!) 152/88   Stable, pt to continue medical treatment maxide 1 qd, and lasix 20 mg prn leg swelling   Hyperglycemia Lab Results  Component Value Date   HGBA1C 6.3 10/05/2021   Stable, pt to continue current medical  treatment  - diet, wt control, excercise   HLD (hyperlipidemia) Lab Results  Component Value Date   LDLCALC 52 10/02/2020   Stable, pt to continue current statin crestor 20 mg qd   Leg swelling Also for BNP with labs  Erectile dysfunction With recent worsening, for viagra prn asd  Followup: No follow-ups on file.  Cathlean Cower, MD 07/15/2022 4:13 PM [facility]

## 2022-07-15 NOTE — Telephone Encounter (Signed)
Rx refill request approved per Dr. Corey's orders. 

## 2022-07-15 NOTE — Assessment & Plan Note (Signed)
Chronic persistent worsening > 6 wks, has had no prior imaging, for MRI LS spine, refer sports medicine, and tramadol prn

## 2022-07-15 NOTE — Assessment & Plan Note (Signed)
Also for BNP with labs

## 2022-07-15 NOTE — Assessment & Plan Note (Signed)
Lab Results  Component Value Date   HGBA1C 6.3 10/05/2021   Stable, pt to continue current medical treatment  - diet, wt control, excercise

## 2022-07-15 NOTE — Assessment & Plan Note (Signed)
BP Readings from Last 3 Encounters:  07/15/22 136/84  04/14/22 (!) 144/92  10/08/21 (!) 152/88   Stable, pt to continue medical treatment maxide 1 qd, and lasix 20 mg prn leg swelling

## 2022-07-15 NOTE — Assessment & Plan Note (Signed)
With recent worsening, for viagra prn asd

## 2022-07-27 ENCOUNTER — Ambulatory Visit (INDEPENDENT_AMBULATORY_CARE_PROVIDER_SITE_OTHER): Payer: 59 | Admitting: Family Medicine

## 2022-07-27 ENCOUNTER — Ambulatory Visit: Payer: Self-pay

## 2022-07-27 VITALS — BP 142/82 | HR 78 | Ht 68.0 in | Wt 307.0 lb

## 2022-07-27 DIAGNOSIS — M25511 Pain in right shoulder: Secondary | ICD-10-CM

## 2022-07-27 DIAGNOSIS — M65321 Trigger finger, right index finger: Secondary | ICD-10-CM

## 2022-07-27 DIAGNOSIS — M6530 Trigger finger, unspecified finger: Secondary | ICD-10-CM

## 2022-07-27 MED ORDER — METHYLPREDNISOLONE ACETATE 80 MG/ML IJ SUSP
80.0000 mg | Freq: Once | INTRAMUSCULAR | Status: AC
  Administered 2022-07-27: 80 mg via INTRAMUSCULAR

## 2022-07-27 NOTE — Patient Instructions (Signed)
Thank you for coming in today.   Call or go to the ER if you develop a large red swollen joint with extreme pain or oozing puss.    Recheck as needed.    

## 2022-07-27 NOTE — Progress Notes (Signed)
Michael Khan, am serving as a Education administrator for Dr. Lynne Leader.  Michael Khan is a 66 y.o. male who presents to [place] at [facility] today for follow up of low back pain and right shoulder pain. Patient was previously seen by tDr. Georgina Snell on 10/08/21 for low back pain and right groin pain stating R hip and low back pain ongoing for 3-4 weeks. Pt woke up one morning w/ severe pain in his R buttock, which resolved.   Today, pt locates pain to R side of the low back, into lateral hip, groin, w/ numbness in his R lower leg. Today patient states pain in right shoulder has been going on for X . Patient locates pain to top and front of shoulder. Low back pain will come and go sometimes he is really good but sometimes he will try and get up and will barely be able to get out of bed. Mostly on left side today.  Additionally he notes triggering right index finger.  This is bothersome and has occurred without injury  Radiates: yes down to his bicep Neck pain: yes Mechanical symptoms: popping and crunching  Numbness/tingling: in the pointer, ring on both hands. Weakness: no Aggravates: raising arm up and reaching behind head.  Treatments tried:  tramadol, advil at night  Dx imaging: R hip MRI- ordered by PCP             10/05/21 R hip & L-spine XR  Pertinent review of systems: No fevers or chills  Relevant historical information: Hypertension   Exam:  BP (!) 142/82   Pulse 78   Ht '5\' 8"'$  (1.727 m)   Wt (!) 307 lb (139.3 kg)   SpO2 98%   BMI 46.68 kg/m  General: Well Developed, well nourished, and in no acute distress.   MSK: Right shoulder normal appearing normal motion pain with abduction.  Positive Hawkins and Neer's test. Intact strength.  Positive empty can test.  Negative Yergason's and speeds test.  Right hand normal appearing Triggering present with flexion of the second digit PIP with palpable nodule in the palmar aspect of the second MCP.  Strength is  intact.    Lab and Radiology Results  Procedure: Real-time Ultrasound Guided Injection of right shoulder subacromial bursa Device: Philips Affiniti 50G Images permanently stored and available for review in PACS Verbal informed consent obtained.  Discussed risks and benefits of procedure. Warned about infection, bleeding, hyperglycemia damage to structures among others. Patient expresses understanding and agreement Time-out conducted.   Noted no overlying erythema, induration, or other signs of local infection.   Skin prepped in a sterile fashion.   Local anesthesia: Topical Ethyl chloride.   With sterile technique and under real time ultrasound guidance: 40 mg of Kenalog and 2 mL of Marcaine injected into subacromial bursa. Fluid seen entering the bursa.   Completed without difficulty   Pain immediately resolved suggesting accurate placement of the medication.   Advised to call if fevers/chills, erythema, induration, drainage, or persistent bleeding.   Images permanently stored and available for review in the ultrasound unit.  Impression: Technically successful ultrasound guided injection.    Procedure: Real-time Ultrasound Guided Injection of right second digit A1 pulley tendon sheath (trigger finger injection) Device: Philips Affiniti 50G Images permanently stored and available for review in PACS Verbal informed consent obtained.  Discussed risks and benefits of procedure. Warned about infection, bleeding, hyperglycemia damage to structures among others. Patient expresses understanding and agreement Time-out conducted.  Noted no overlying erythema, induration, or other signs of local infection.   Skin prepped in a sterile fashion.   Local anesthesia: Topical Ethyl chloride.   With sterile technique and under real time ultrasound guidance: 0.5 mL of 80 mg/mL Depo-Medrol solution and 1 mL of lidocaine injected into tendon sheath at the A1 pulley. Fluid seen entering the tendon  sheath.   Completed without difficulty   Pain immediately resolved suggesting accurate placement of the medication.   Advised to call if fevers/chills, erythema, induration, drainage, or persistent bleeding.   Images permanently stored and available for review in the ultrasound unit.  Impression: Technically successful ultrasound guided injection.         Assessment and Plan: 66 y.o. male with right shoulder pain due to subacromial bursitis.  Plan for subacromial injection and home exercise program.  Right trigger finger.  Plan for trigger finger injection a double Band-Aid splint.  Recheck as needed.   PDMP not reviewed this encounter. Orders Placed This Encounter  Procedures   [place] LIMITED JOINT SPACE STRUCTURES UP RIGHT(NO LINKED CHARGES)    Standing Status:   Future    Number of Occurrences:   1    Standing Expiration Date:   01/25/2023    Order Specific Question:   Reason for Exam (SYMPTOM  OR DIAGNOSIS REQUIRED)    Answer:   right shoulder pain    Order Specific Question:   Preferred imaging location?    Answer:   [place]   Meds ordered this encounter  Medications   methylPREDNISolone acetate (DEPO-MEDROL) injection 80 mg     Discussed warning signs or symptoms. Please see discharge instructions. Patient expresses understanding.   The above documentation has been reviewed and is accurate and complete Lynne Leader, M.D.

## 2022-08-09 ENCOUNTER — Other Ambulatory Visit: Payer: Self-pay | Admitting: Family Medicine

## 2022-08-09 ENCOUNTER — Telehealth: Payer: Self-pay | Admitting: Internal Medicine

## 2022-08-09 ENCOUNTER — Other Ambulatory Visit: Payer: Self-pay

## 2022-08-09 MED ORDER — FUROSEMIDE 20 MG PO TABS: 20.0000 mg | ORAL_TABLET | Freq: Every day | ORAL | 11 refills | Status: DC | PRN

## 2022-08-09 NOTE — Telephone Encounter (Signed)
Caller & Relationship to patient: Self  Call back number: [phone]   Date of last office visit: 11.17.23  Date of next office visit: 2.8.23  Medication(s) to be refilled:  furosemide (LASIX) 20 MG tablet   Preferred Pharmacy:   [facility]   Phone: [phone]  Fax: [phone]

## 2022-08-09 NOTE — Telephone Encounter (Signed)
Rx request sent to pharmacy, patient informed via voicemail

## 2022-08-09 NOTE — Telephone Encounter (Signed)
Rx refill request approved per Dr. Corey's orders. 

## 2022-08-11 ENCOUNTER — Other Ambulatory Visit: Payer: 59

## 2022-08-31 ENCOUNTER — Other Ambulatory Visit: Payer: Self-pay | Admitting: Internal Medicine

## 2022-08-31 MED ORDER — TRAMADOL HCL 50 MG PO TABS: 50.0000 mg | ORAL_TABLET | Freq: Four times a day (QID) | ORAL | 2 refills | Status: DC | PRN

## 2022-09-13 ENCOUNTER — Other Ambulatory Visit: Payer: Self-pay | Admitting: Family Medicine

## 2022-09-13 NOTE — Telephone Encounter (Signed)
Rx refill request approved per Dr. Corey's orders. 

## 2022-10-01 ENCOUNTER — Other Ambulatory Visit: Payer: Self-pay | Admitting: Internal Medicine

## 2022-10-01 NOTE — Telephone Encounter (Signed)
Please refill as per office routine med refill policy (all routine meds to be refilled for 3 mo or monthly (per pt preference) up to one year from last visit, then month to month grace period for 3 mo, then further med refills will have to be denied) ? ?

## 2022-10-06 ENCOUNTER — Encounter: Payer: Self-pay | Admitting: Internal Medicine

## 2022-10-06 ENCOUNTER — Ambulatory Visit (INDEPENDENT_AMBULATORY_CARE_PROVIDER_SITE_OTHER): Payer: 59 | Admitting: Internal Medicine

## 2022-10-06 ENCOUNTER — Other Ambulatory Visit: Payer: Self-pay | Admitting: Internal Medicine

## 2022-10-06 VITALS — BP 122/82 | HR 88 | Temp 99.0°F | Ht 68.0 in | Wt 300.0 lb

## 2022-10-06 DIAGNOSIS — E7800 Pure hypercholesterolemia, unspecified: Secondary | ICD-10-CM

## 2022-10-06 DIAGNOSIS — I10 Essential (primary) hypertension: Secondary | ICD-10-CM | POA: Diagnosis not present

## 2022-10-06 DIAGNOSIS — Z0001 Encounter for general adult medical examination with abnormal findings: Secondary | ICD-10-CM

## 2022-10-06 DIAGNOSIS — S76309A Unspecified injury of muscle, fascia and tendon of the posterior muscle group at thigh level, unspecified thigh, initial encounter: Secondary | ICD-10-CM | POA: Insufficient documentation

## 2022-10-06 DIAGNOSIS — Z125 Encounter for screening for malignant neoplasm of prostate: Secondary | ICD-10-CM

## 2022-10-06 DIAGNOSIS — S76301A Unspecified injury of muscle, fascia and tendon of the posterior muscle group at thigh level, right thigh, initial encounter: Secondary | ICD-10-CM | POA: Diagnosis not present

## 2022-10-06 DIAGNOSIS — E559 Vitamin D deficiency, unspecified: Secondary | ICD-10-CM

## 2022-10-06 DIAGNOSIS — E538 Deficiency of other specified B group vitamins: Secondary | ICD-10-CM

## 2022-10-06 DIAGNOSIS — R739 Hyperglycemia, unspecified: Secondary | ICD-10-CM | POA: Diagnosis not present

## 2022-10-06 DIAGNOSIS — E039 Hypothyroidism, unspecified: Secondary | ICD-10-CM

## 2022-10-06 DIAGNOSIS — H269 Unspecified cataract: Secondary | ICD-10-CM | POA: Diagnosis not present

## 2022-10-06 LAB — BASIC METABOLIC PANEL
BUN: 20 mg/dL (ref 6–23)
CO2: 28 mEq/L (ref 19–32)
Calcium: 9.8 mg/dL (ref 8.4–10.5)
Chloride: 98 mEq/L (ref 96–112)
Creatinine, Ser: 1.29 mg/dL (ref 0.40–1.50)
GFR: 57.43 mL/min — ABNORMAL LOW (ref >60.00)
Glucose, Bld: 103 mg/dL — ABNORMAL HIGH (ref 70–99)
Potassium: 4 mEq/L (ref 3.5–5.1)
Sodium: 138 mEq/L (ref 135–145)

## 2022-10-06 LAB — HEPATIC FUNCTION PANEL
ALT: 25 U/L (ref 0–53)
AST: 24 U/L (ref 0–37)
Albumin: 4 g/dL (ref 3.5–5.2)
Alkaline Phosphatase: 62 U/L (ref 39–117)
Bilirubin, Direct: 0.2 mg/dL (ref 0.0–0.3)
Total Bilirubin: 0.8 mg/dL (ref 0.2–1.2)
Total Protein: 7.1 g/dL (ref 6.0–8.3)

## 2022-10-06 LAB — CBC WITH DIFFERENTIAL/PLATELET
Basophils Absolute: 0.1 10*3/uL (ref 0.0–0.1)
Basophils Relative: 0.7 % (ref 0.0–3.0)
Eosinophils Absolute: 0.2 10*3/uL (ref 0.0–0.7)
Eosinophils Relative: 1.8 % (ref 0.0–5.0)
HCT: 45.3 % (ref 39.0–52.0)
Hemoglobin: 15.1 g/dL (ref 13.0–17.0)
Lymphocytes Relative: 24.8 % (ref 12.0–46.0)
Lymphs Abs: 2.3 10*3/uL (ref 0.7–4.0)
MCHC: 33.4 g/dL (ref 30.0–36.0)
MCV: 91.2 fl (ref 78.0–100.0)
Monocytes Absolute: 0.8 10*3/uL (ref 0.1–1.0)
Monocytes Relative: 8.4 % (ref 3.0–12.0)
Neutro Abs: 6 10*3/uL (ref 1.4–7.7)
Neutrophils Relative %: 64.3 % (ref 43.0–77.0)
Platelets: 247 10*3/uL (ref 150.0–400.0)
RBC: 4.96 Mil/uL (ref 4.22–5.81)
RDW: 14.3 % (ref 11.5–15.5)
WBC: 9.3 10*3/uL (ref 4.0–10.5)

## 2022-10-06 LAB — HEMOGLOBIN A1C: Hgb A1c MFr Bld: 6.7 % — ABNORMAL HIGH (ref 4.6–6.5)

## 2022-10-06 LAB — URINALYSIS, ROUTINE W REFLEX MICROSCOPIC
Bilirubin Urine: NEGATIVE
Hgb urine dipstick: NEGATIVE
Ketones, ur: NEGATIVE
Leukocytes,Ua: NEGATIVE
Nitrite: NEGATIVE
RBC / HPF: NONE SEEN (ref 0–?)
Specific Gravity, Urine: 1.025 (ref 1.000–1.030)
Total Protein, Urine: NEGATIVE
Urine Glucose: NEGATIVE
Urobilinogen, UA: 0.2 (ref 0.0–1.0)
WBC, UA: NONE SEEN (ref 0–?)
pH: 5.5 (ref 5.0–8.0)

## 2022-10-06 LAB — MICROALBUMIN / CREATININE URINE RATIO
Creatinine,U: 71.3 mg/dL
Microalb Creat Ratio: 1 mg/g (ref 0.0–30.0)
Microalb, Ur: <0.7 mg/dL (ref 0.0–1.9)

## 2022-10-06 LAB — LIPID PANEL
Cholesterol: 165 mg/dL (ref 0–200)
HDL: 38.9 mg/dL — ABNORMAL LOW (ref >39.00)
LDL Cholesterol: 97 mg/dL (ref 0–99)
NonHDL: 125.89
Total CHOL/HDL Ratio: 4
Triglycerides: 145 mg/dL (ref 0.0–149.0)
VLDL: 29 mg/dL (ref 0.0–40.0)

## 2022-10-06 LAB — VITAMIN B12: Vitamin B-12: >1500 pg/mL — ABNORMAL HIGH (ref 211–911)

## 2022-10-06 LAB — PSA: PSA: 0.48 ng/mL (ref 0.10–4.00)

## 2022-10-06 LAB — TSH: TSH: 3.6 u[IU]/mL (ref 0.35–5.50)

## 2022-10-06 LAB — VITAMIN D 25 HYDROXY (VIT D DEFICIENCY, FRACTURES): VITD: 56.35 ng/mL (ref 30.00–100.00)

## 2022-10-06 MED ORDER — PHENTERMINE HCL 37.5 MG PO CAPS: ORAL_CAPSULE | ORAL | 2 refills | Status: DC

## 2022-10-06 NOTE — Telephone Encounter (Signed)
Please refill as per office routine med refill policy (all routine meds to be refilled for 3 mo or monthly (per pt preference) up to one year from last visit, then month to month grace period for 3 mo, then further med refills will have to be denied) ? ?

## 2022-10-06 NOTE — Progress Notes (Signed)
Patient ID: Michael Khan, male   DOB: Nov 23, 1955, 67 y.o.   MRN: [id number]         Chief Complaint:: wellness exam and Follow-up (Concerns with weight , back of right leg sore )  , low vit d, obesity, hyperglycemia, right post knee pain       HPI:  Michael Khan is a 67 y.o. male here for wellness exam; declines all immunizations due to hx of Gbarre, colonoscopy but for eye exam soon, o/w up to date                Also unable to lose wt despite more attention to diet [facility].  Gets a lot of walking in at work.  Has been trying harder recenlty, now with tenderness mild to m od for 1 wk to the right post knee medial aspect without swelling, rash, but somewhat limps to walk.  Hard to lose wt with diet and exercise, cannot afford GLP1 med.  Pt denies chest pain, increased sob or doe, wheezing, orthopnea, PND, increased LE swelling, palpitations, dizziness or syncope.   Pt denies polydipsia, polyuria, or new focal neuro s/s.      Wt Readings from Last 3 Encounters:  10/06/22 300 lb (136.1 kg)  07/27/22 (!) 307 lb (139.3 kg)  07/15/22 (!) 307 lb (139.3 kg)   BP Readings from Last 3 Encounters:  10/06/22 122/82  07/27/22 (!) 142/82  07/15/22 136/84   Immunization History  Administered Date(s) Administered   PFIZER(Purple Top)SARS-COV-2 Vaccination 11/28/2019, 12/23/2019, 06/23/2020   Health Maintenance Due  Topic Date Due   Medicare Annual Wellness (AWV)  Never done   DTaP/Tdap/Td (1 - Tdap) Never done   OPHTHALMOLOGY EXAM  10/09/2018      Past Medical History:  Diagnosis Date   Guillain Barr syndrome [facility])    When he was 34, he received a flu shot caused him to develop Guillain Barre Syndrome   Hyperlipemia    Hypothyroidism 07/24/2019   Measles    Past Surgical History:  Procedure Laterality Date   ANKLE RECONSTRUCTION     CHOLECYSTECTOMY     NOSE SURGERY     WRIST RECONSTRUCTION      reports that he has never smoked. He has never used  smokeless tobacco. He reports that he does not drink alcohol and does not use drugs. family history includes Healthy in his father; Liver cancer in his mother. Allergies  Allergen Reactions   Drug Class [Haemophilus Influenzae Vaccines] Other (See Comments)    Caused to not walk   Lipitor [Atorvastatin Calcium]     discomfort   Current Outpatient Medications on File Prior to Visit  Medication Sig Dispense Refill   aspirin 81 MG EC tablet Take 1 tablet (81 mg total) by mouth daily. Swallow whole. 30 tablet 12   cetirizine (ZYRTEC) 10 MG tablet Take 1 tablet (10 mg total) by mouth daily. 30 tablet 11   Cholecalciferol 50 MCG (2000 UT) CAPS Take by mouth.     furosemide (LASIX) 20 MG tablet Take 1 tablet (20 mg total) by mouth daily as needed. 30 tablet 11   gabapentin (NEURONTIN) 300 MG capsule TAKE 1 CAPSULE BY MOUTH THREE TIMES DAILY 90 capsule 0   gentamicin cream (GARAMYCIN) 0.1 % Apply 1 application topically 2 (two) times daily. 30 g 1   levothyroxine (SYNTHROID) 75 MCG tablet Take 1 tablet by mouth once daily 90 tablet 3   naproxen (  NAPROSYN) 500 MG tablet Take 1 tablet (500 mg total) by mouth 2 (two) times daily as needed for moderate pain. 60 tablet 2   pantoprazole (PROTONIX) 40 MG tablet Take 1 tablet by mouth once daily 90 tablet 0   rosuvastatin (CRESTOR) 10 MG tablet TAKE 1 TABLET BY MOUTH ONCE DAILY . APPOINTMENT REQUIRED FOR FUTURE REFILLS 90 tablet 3   sildenafil (VIAGRA) 100 MG tablet Take 0.5-1 tablets (50-100 mg total) by mouth daily as needed for erectile dysfunction. 5 tablet 11   traMADol (ULTRAM) 50 MG tablet Take 1 tablet (50 mg total) by mouth every 6 (six) hours as needed. 60 tablet 2   triamcinolone (NASACORT) 55 MCG/ACT AERO nasal inhaler Place 2 sprays into the nose daily. 1 each 12   triamterene-hydrochlorothiazide (MAXZIDE) 75-50 MG tablet Take 1 tablet by mouth once daily 90 tablet 3   No current facility-administered medications on file prior to visit.         ROS:  All others reviewed and negative.  Objective        PE:  BP 122/82 (BP Location: Right Arm, Patient Position: Sitting, Cuff Size: Normal)   Pulse 88   Temp 99 F (37.2 C) (Oral)   Ht 5' 8"$  (1.727 m)   Wt 300 lb (136.1 kg)   SpO2 98%   BMI 45.61 kg/m                 Constitutional: Pt appears in NAD               HENT: Head: NCAT.                Right Ear: External ear normal.                 Left Ear: External ear normal.                Eyes: . Pupils are equal, round, and reactive to light. Conjunctivae and EOM are normal               Nose: without d/c or deformity               Neck: Neck supple. Gross normal ROM               Cardiovascular: Normal rate and regular rhythm.                 Pulmonary/Chest: Effort normal and breath sounds without rales or wheezing.                Abd:  Soft, NT, ND, + BS, no organomegaly               Neurological: Pt is alert. At baseline orientation, motor grossly intact               Skin: Skin is warm. No rashes, no other new lesions, LE edema - none               Right post medial knee with mild tender of tendon insertion site               Psychiatric: Pt behavior is normal without agitation   Micro: none  Cardiac tracings I have personally interpreted today:  none  Pertinent Radiological findings (summarize): none   Lab Results  Component Value Date   WBC 9.3 10/06/2022   HGB 15.1 10/06/2022   HCT 45.3 10/06/2022   PLT 247.0 10/06/2022   GLUCOSE 103 (  H) 10/06/2022   CHOL 165 10/06/2022   TRIG 145.0 10/06/2022   HDL 38.90 (L) 10/06/2022   LDLDIRECT 79.0 10/05/2021   LDLCALC 97 10/06/2022   ALT 25 10/06/2022   AST 24 10/06/2022   NA 138 10/06/2022   K 4.0 10/06/2022   CL 98 10/06/2022   CREATININE 1.29 10/06/2022   BUN 20 10/06/2022   CO2 28 10/06/2022   TSH 3.60 10/06/2022   PSA 0.48 10/06/2022   INR 1.0 10/03/2008   HGBA1C 6.7 (H) 10/06/2022   MICROALBUR <0.7 10/06/2022   Assessment/Plan:  Michael Khan  is a 67 y.o. Black or African American [2] male with  has a past medical history of Guillain Barr syndrome ([place]), Hyperlipemia, Hypothyroidism (07/24/2019), and Measles.  Encounter for routine adult medical exam with abnormal findings Age and sex appropriate education and counseling updated with regular exercise and diet Referrals for preventative services - for eye exam referral Immunizations addressed - declines all due to hx of G Barre Smoking counseling  - none needed Evidence for depression or other mood disorder - none significant Most recent labs reviewed. I have personally reviewed and have noted: 1) the patient's medical and social history 2) The patient's current medications and supplements 3) The patient's height, weight, and BMI have been recorded in the chart   Vitamin D deficiency Last vitamin D Lab Results  Component Value Date   VD25OH 56.35 10/06/2022   Stable, cont oral replacement   Morbid obesity ([place]) Persistent, for phentermine 37.5 qd for limited rx,  to f/u any worsening symptoms or concerns  Hypothyroidism Lab Results  Component Value Date   TSH 3.60 10/06/2022   Stable, pt to continue levothyroxine 75 mcg qd  Hypertension BP Readings from Last 3 Encounters:  10/06/22 122/82  07/27/22 (!) 142/82  07/15/22 136/84   Stable, pt to continue medical treatment maxide 75-50 qd   Hyperglycemia Lab Results  Component Value Date   HGBA1C 6.7 (H) 10/06/2022   Uncontrolled, pt for wt loss and diet control, declines OHA for now   HLD (hyperlipidemia) Lab Results  Component Value Date   LDLCALC 97 10/06/2022   Uncontrolled, goal ldl < 70, , pt to start crestor 10 mg qd   B12 deficiency Lab Results  Component Value Date   VITAMINB12 >1500 (H) 10/06/2022   Stable, cont oral replacement - b12 1000 mcg qd   Hamstring injury With mild pain due to recent attempt at increased exercise, for tylenol prn  Followup: Return in about 6 months  (around 04/06/2023).  Cathlean Cower, MD 10/08/2022 8:12 PM [facility]

## 2022-10-06 NOTE — Patient Instructions (Addendum)
Please take all new medication as prescribed  - the phentermine  You will be contacted regarding the referral for: Eye doctor  Please continue all other medications as before, and refills have been done if requested.  Please have the pharmacy call with any other refills you may need.  Please continue your efforts at being more active, low cholesterol diet, and weight control.  You are otherwise up to date with prevention measures today.  Please keep your appointments with your specialists as you may have planned  Please go to the LAB at the blood drawing area for the tests to be done  You will be contacted by phone if any changes need to be made immediately.  Otherwise, you will receive a letter about your results with an explanation, but please check with MyChart first.  Please remember to sign up for MyChart if you have not done so, as this will be important to you in the future with finding out test results, communicating by private email, and scheduling acute appointments online when needed.  Please make an Appointment to return in 6 months, or sooner if needed

## 2022-10-08 ENCOUNTER — Other Ambulatory Visit: Payer: Self-pay | Admitting: Internal Medicine

## 2022-10-08 ENCOUNTER — Encounter: Payer: Self-pay | Admitting: Internal Medicine

## 2022-10-08 NOTE — Assessment & Plan Note (Signed)
Persistent, for phentermine 37.5 qd for limited rx,  to f/u any worsening symptoms or concerns

## 2022-10-08 NOTE — Telephone Encounter (Signed)
Please refill as per office routine med refill policy (all routine meds to be refilled for 3 mo or monthly (per pt preference) up to one year from last visit, then month to month grace period for 3 mo, then further med refills will have to be denied) ? ?

## 2022-10-08 NOTE — Assessment & Plan Note (Signed)
Lab Results  Component Value Date   LDLCALC 97 10/06/2022   Uncontrolled, goal ldl < 70, , pt to start crestor 10 mg qd

## 2022-10-08 NOTE — Assessment & Plan Note (Signed)
Age and sex appropriate education and counseling updated with regular exercise and diet Referrals for preventative services - for eye exam referral Immunizations addressed - declines all due to hx of G Barre Smoking counseling  - none needed Evidence for depression or other mood disorder - none significant Most recent labs reviewed. I have personally reviewed and have noted: 1) the patient's medical and social history 2) The patient's current medications and supplements 3) The patient's height, weight, and BMI have been recorded in the chart

## 2022-10-08 NOTE — Assessment & Plan Note (Signed)
Lab Results  Component Value Date   TSH 3.60 10/06/2022   Stable, pt to continue levothyroxine 75 mcg qd

## 2022-10-08 NOTE — Assessment & Plan Note (Signed)
Last vitamin D Lab Results  Component Value Date   VD25OH 56.35 10/06/2022   Stable, cont oral replacement

## 2022-10-08 NOTE — Assessment & Plan Note (Signed)
Lab Results  Component Value Date   VITAMINB12 >1500 (H) 10/06/2022   Stable, cont oral replacement - b12 1000 mcg qd

## 2022-10-08 NOTE — Assessment & Plan Note (Signed)
With mild pain due to recent attempt at increased exercise, for tylenol prn

## 2022-10-08 NOTE — Assessment & Plan Note (Signed)
BP Readings from Last 3 Encounters:  10/06/22 122/82  07/27/22 (!) 142/82  07/15/22 136/84   Stable, pt to continue medical treatment maxide 75-50 qd

## 2022-10-08 NOTE — Assessment & Plan Note (Signed)
Lab Results  Component Value Date   HGBA1C 6.7 (H) 10/06/2022   Uncontrolled, pt for wt loss and diet control, declines OHA for now

## 2022-10-11 ENCOUNTER — Other Ambulatory Visit: Payer: Self-pay | Admitting: Family Medicine

## 2022-10-11 NOTE — Telephone Encounter (Signed)
Rx refill request approved per Dr. Corey's orders. 

## 2022-10-13 ENCOUNTER — Other Ambulatory Visit: Payer: Self-pay | Admitting: Internal Medicine

## 2022-10-13 NOTE — Telephone Encounter (Signed)
Please refill as per office routine med refill policy (all routine meds to be refilled for 3 mo or monthly (per pt preference) up to one year from last visit, then month to month grace period for 3 mo, then further med refills will have to be denied) ? ?

## 2022-10-14 ENCOUNTER — Telehealth: Payer: Self-pay | Admitting: Internal Medicine

## 2022-10-14 NOTE — Telephone Encounter (Signed)
Pt called requesting Rx for rosuvastatin (CRESTOR) 10 MG   Pt states that is he is completely out of this medication  Please call pt with update

## 2022-10-14 NOTE — Telephone Encounter (Signed)
Refill sent to pharmacy on 10/13/22, patient informed

## 2022-10-18 ENCOUNTER — Other Ambulatory Visit: Payer: Self-pay | Admitting: Internal Medicine

## 2022-10-18 NOTE — Telephone Encounter (Signed)
Please refill as per office routine med refill policy (all routine meds to be refilled for 3 mo or monthly (per pt preference) up to one year from last visit, then month to month grace period for 3 mo, then further med refills will have to be denied) ? ?

## 2022-11-03 ENCOUNTER — Telehealth: Payer: Self-pay | Admitting: Internal Medicine

## 2022-11-03 NOTE — Telephone Encounter (Signed)
Patient states that he had to call EMS today because he was light headed.  EMS told him that he was dehydrated.  Patient wants to know if he should cut back on his lasix.  Please call patient and let him know.  Patient's number:  [phone]

## 2022-11-03 NOTE — Telephone Encounter (Signed)
Please advise 

## 2022-11-03 NOTE — Telephone Encounter (Signed)
Yes, the lasix 20 mg was only for leg swelling as needed, so no need to take every day    Please consider rov to further check in the office soon

## 2022-11-04 ENCOUNTER — Other Ambulatory Visit: Payer: Self-pay | Admitting: Internal Medicine

## 2022-11-04 ENCOUNTER — Other Ambulatory Visit: Payer: Self-pay

## 2022-11-04 MED ORDER — LEVOTHYROXINE SODIUM 75 MCG PO TABS: 75.0000 ug | ORAL_TABLET | Freq: Every day | ORAL | 3 refills | Status: DC

## 2022-11-04 MED ORDER — SOLIFENACIN SUCCINATE 5 MG PO TABS: 5.0000 mg | ORAL_TABLET | Freq: Every day | ORAL | 0 refills | Status: DC

## 2022-11-04 NOTE — Telephone Encounter (Signed)
Spoke with patient and informed him that the medication should not be taken everyday, he states he will not take it tomorrow as he has taken it today and give [place] an update. No further questions at this time.

## 2022-11-22 DIAGNOSIS — H2513 Age-related nuclear cataract, bilateral: Secondary | ICD-10-CM | POA: Diagnosis not present

## 2022-11-22 DIAGNOSIS — H0102B Squamous blepharitis left eye, upper and lower eyelids: Secondary | ICD-10-CM | POA: Diagnosis not present

## 2022-11-22 DIAGNOSIS — H17823 Peripheral opacity of cornea, bilateral: Secondary | ICD-10-CM | POA: Diagnosis not present

## 2022-11-22 DIAGNOSIS — Q100 Congenital ptosis: Secondary | ICD-10-CM | POA: Diagnosis not present

## 2022-11-22 DIAGNOSIS — H0102A Squamous blepharitis right eye, upper and lower eyelids: Secondary | ICD-10-CM | POA: Diagnosis not present

## 2022-11-22 DIAGNOSIS — H43393 Other vitreous opacities, bilateral: Secondary | ICD-10-CM | POA: Diagnosis not present

## 2022-12-09 ENCOUNTER — Other Ambulatory Visit: Payer: Self-pay

## 2022-12-09 ENCOUNTER — Other Ambulatory Visit: Payer: Self-pay | Admitting: Internal Medicine

## 2022-12-09 ENCOUNTER — Telehealth: Payer: Self-pay | Admitting: Internal Medicine

## 2022-12-09 MED ORDER — SOLIFENACIN SUCCINATE 5 MG PO TABS: 5.0000 mg | ORAL_TABLET | Freq: Every day | ORAL | 0 refills | Status: DC

## 2022-12-09 NOTE — Telephone Encounter (Signed)
Sent erx

## 2022-12-09 NOTE — Telephone Encounter (Signed)
Prescription Request  12/09/2022  LOV: 10/06/2022  What is the name of the medication or equipment?  solifenacin (VESICARE) 5 MG tablet   Have you contacted your pharmacy to request a refill? Yes   Which pharmacy would you like this sent to?  [facility] [address] ([address]), [place] - [address] [place]) [place] [address] Phone: [phone] Fax: [phone]    Patient notified that their request is being sent to the clinical staff for review and that they should receive a response within 2 business days.   Please advise at Mobile [phone] (mobile)

## 2022-12-20 ENCOUNTER — Other Ambulatory Visit: Payer: Self-pay

## 2022-12-20 MED ORDER — GABAPENTIN 300 MG PO CAPS: 300.0000 mg | ORAL_CAPSULE | Freq: Three times a day (TID) | ORAL | 1 refills | Status: DC

## 2022-12-20 NOTE — Telephone Encounter (Signed)
Last OV 07/27/22 Next OV not scheduled  Last refill 10/11/22 #90/0

## 2022-12-25 ENCOUNTER — Other Ambulatory Visit: Payer: Self-pay | Admitting: Internal Medicine

## 2022-12-30 ENCOUNTER — Other Ambulatory Visit: Payer: Self-pay

## 2022-12-30 MED ORDER — PANTOPRAZOLE SODIUM 40 MG PO TBEC: 40.0000 mg | DELAYED_RELEASE_TABLET | Freq: Every day | ORAL | 3 refills | Status: DC

## 2023-01-07 ENCOUNTER — Other Ambulatory Visit: Payer: Self-pay | Admitting: Internal Medicine

## 2023-01-08 ENCOUNTER — Other Ambulatory Visit: Payer: Self-pay | Admitting: Internal Medicine

## 2023-01-09 ENCOUNTER — Other Ambulatory Visit: Payer: Self-pay

## 2023-02-09 ENCOUNTER — Other Ambulatory Visit: Payer: Self-pay | Admitting: Internal Medicine

## 2023-02-15 ENCOUNTER — Ambulatory Visit: Payer: 59 | Admitting: Internal Medicine

## 2023-02-16 ENCOUNTER — Encounter: Payer: Self-pay | Admitting: Family Medicine

## 2023-02-16 ENCOUNTER — Ambulatory Visit (INDEPENDENT_AMBULATORY_CARE_PROVIDER_SITE_OTHER): Payer: 59 | Admitting: Family Medicine

## 2023-02-16 VITALS — BP 142/86 | HR 94 | Temp 97.6°F | Ht 68.0 in | Wt 293.0 lb

## 2023-02-16 DIAGNOSIS — M79605 Pain in left leg: Secondary | ICD-10-CM | POA: Diagnosis not present

## 2023-02-16 DIAGNOSIS — M79604 Pain in right leg: Secondary | ICD-10-CM | POA: Diagnosis not present

## 2023-02-16 DIAGNOSIS — R600 Localized edema: Secondary | ICD-10-CM

## 2023-02-16 DIAGNOSIS — M79672 Pain in left foot: Secondary | ICD-10-CM | POA: Diagnosis not present

## 2023-02-16 DIAGNOSIS — M79671 Pain in right foot: Secondary | ICD-10-CM

## 2023-02-16 LAB — BASIC METABOLIC PANEL
BUN: 20 mg/dL (ref 6–23)
CO2: 28 mEq/L (ref 19–32)
Calcium: 9.7 mg/dL (ref 8.4–10.5)
Chloride: 98 mEq/L (ref 96–112)
Creatinine, Ser: 1.19 mg/dL (ref 0.40–1.50)
GFR: 63.1 mL/min (ref >60.00)
Glucose, Bld: 122 mg/dL — ABNORMAL HIGH (ref 70–99)
Potassium: 4.2 mEq/L (ref 3.5–5.1)
Sodium: 136 mEq/L (ref 135–145)

## 2023-02-16 LAB — BRAIN NATRIURETIC PEPTIDE: Pro B Natriuretic peptide (BNP): 42 pg/mL (ref 0.0–100.0)

## 2023-02-16 NOTE — Progress Notes (Signed)
Subjective:     Patient ID: Michael Khan, male    DOB: 1956-07-09, 67 y.o.   MRN: [id number]  Chief Complaint  Patient presents with   Skin Discoloration    On and off redness on legs, is on fluid pills. Started about a week and a half ago. Up on his legs a lot due to job.     HPI  Discussed the use of AI scribe software for clinical note transcription with the patient, who gave verbal consent to proceed.  History of Present Illness         C/o intermittent LE edema and leg redness.   Reports long hx of neuropathy.   He was taking furosemide and felt that he was getting dehydrated so now he is taking it as needed.   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  Does yard work.      Health Maintenance Due  Topic Date Due   Medicare Annual Wellness (AWV)  Never done   DTaP/Tdap/Td (1 - Tdap) Never done   Zoster Vaccines- Shingrix (1 of 2) Never done   OPHTHALMOLOGY EXAM  10/09/2018   COVID-19 Vaccine (4 - 2023-24 season) 04/29/2022    Past Medical History:  Diagnosis Date   Guillain Barr syndrome [facility])    When he was 39, he received a flu shot caused him to develop Guillain Barre Syndrome   Hyperlipemia    Hypothyroidism 07/24/2019   Measles     Past Surgical History:  Procedure Laterality Date   ANKLE RECONSTRUCTION     CHOLECYSTECTOMY     NOSE SURGERY     WRIST RECONSTRUCTION      Family History  Problem Relation Age of Onset   Liver cancer Mother    Healthy Father    Colon cancer Neg Hx     Social History   Socioeconomic History   Marital status: Married    Spouse name: Not on file   Number of children: 2   Years of education: 12   Highest education level: Not on file  Occupational History   Occupation: Security  Tobacco Use   Smoking status: Never   Smokeless tobacco: Never  Substance and Sexual Activity   Alcohol use: No   Drug use: No   Sexual activity: Yes  Other Topics Concern   Not on file  Social History  Narrative   ** Merged History Encounter **       Fun: Make wine, work in the yard, play basketball, cycling, play with his grandson   Social Determinants of Corporate investment banker Strain: Not on file  Food Insecurity: Not on file  Transportation Needs: Not on file  Physical Activity: Not on file  Stress: Not on file  Social Connections: Not on file  Intimate Partner Violence: Not on file    Outpatient Medications Prior to Visit  Medication Sig Dispense Refill   aspirin 81 MG EC tablet Take 1 tablet (81 mg total) by mouth daily. Swallow whole. 30 tablet 12   cetirizine (ZYRTEC) 10 MG tablet Take 1 tablet (10 mg total) by mouth daily. 30 tablet 11   Cholecalciferol 50 MCG (2000 UT) CAPS Take by mouth.     furosemide (LASIX) 20 MG tablet Take 1 tablet (20 mg total) by mouth daily as needed. 30 tablet 11   gabapentin (NEURONTIN) 300 MG capsule Take 1 capsule (300 mg total) by mouth 3 (three) times daily. 90 capsule 1  gentamicin cream (GARAMYCIN) 0.1 % Apply 1 application topically 2 (two) times daily. 30 g 1   levothyroxine (SYNTHROID) 75 MCG tablet Take 1 tablet (75 mcg total) by mouth daily. 90 tablet 3   naproxen (NAPROSYN) 500 MG tablet Take 1 tablet (500 mg total) by mouth 2 (two) times daily as needed for moderate pain. 60 tablet 2   pantoprazole (PROTONIX) 40 MG tablet Take 1 tablet (40 mg total) by mouth daily. 90 tablet 3   phentermine 37.5 MG capsule Take 1 capsule by mouth in the morning 30 capsule 2   rosuvastatin (CRESTOR) 10 MG tablet Take 1 tablet (10 mg total) by mouth daily. 90 tablet 3   sildenafil (VIAGRA) 100 MG tablet Take 0.5-1 tablets (50-100 mg total) by mouth daily as needed for erectile dysfunction. 5 tablet 11   solifenacin (VESICARE) 5 MG tablet Take 1 tablet by mouth once daily 30 tablet 0   traMADol (ULTRAM) 50 MG tablet Take 1 tablet (50 mg total) by mouth every 6 (six) hours as needed. 60 tablet 2   triamcinolone (NASACORT) 55 MCG/ACT AERO nasal  inhaler Place 2 sprays into the nose daily. 1 each 12   triamterene-hydrochlorothiazide (MAXZIDE) 75-50 MG tablet Take 1 tablet by mouth once daily 90 tablet 3   No facility-administered medications prior to visit.    Allergies  Allergen Reactions   Drug Class [Haemophilus Influenzae Vaccines] Other (See Comments)    Caused to not walk   Lipitor [Atorvastatin Calcium]     discomfort    Review of Systems  Constitutional:  Negative for chills and fever.  Respiratory:  Negative for shortness of breath.   Cardiovascular:  Positive for leg swelling. Negative for chest pain and palpitations.  Gastrointestinal:  Negative for abdominal pain, constipation, diarrhea, nausea and vomiting.  Genitourinary:  Negative for dysuria, frequency and urgency.  Neurological:  Negative for dizziness and focal weakness.       Objective:    Physical Exam Constitutional:      General: He is not in acute distress.    Appearance: He is not ill-appearing.  Eyes:     Extraocular Movements: Extraocular movements intact.     Conjunctiva/sclera: Conjunctivae normal.  Cardiovascular:     Rate and Rhythm: Normal rate and regular rhythm.  Pulmonary:     Effort: Pulmonary effort is normal.     Breath sounds: Normal breath sounds.  Musculoskeletal:     Cervical back: Normal range of motion and neck supple.     Right lower leg: 1+ Pitting Edema present.     Left lower leg: 1+ Pitting Edema present.  Skin:    General: Skin is warm and dry.     Findings: No erythema.  Neurological:     General: No focal deficit present.     Mental Status: He is alert and oriented to person, place, and time.     Motor: No weakness.     Coordination: Coordination normal.     Gait: Gait normal.  Psychiatric:        Mood and Affect: Mood normal.        Behavior: Behavior normal.        Thought Content: Thought content normal.      BP (!) 142/86 (BP Location: Left Arm, Patient Position: Sitting, Cuff Size: Large)    Pulse 94   Temp 97.6 F (36.4 C) (Temporal)   Ht 5\' 8"  (1.727 m)   Wt 293 lb (132.9 kg)   SpO2 97%  BMI 44.55 kg/m  Wt Readings from Last 3 Encounters:  02/16/23 293 lb (132.9 kg)  10/06/22 300 lb (136.1 kg)  07/27/22 (!) 307 lb (139.3 kg)       Assessment & Plan:   Problem List Items Addressed This Visit       Other   Bilateral leg and foot pain   Other Visit Diagnoses     Bilateral leg edema    -  Primary   Relevant Orders   Basic metabolic panel (Completed)   Brain natriuretic peptide (Completed)      No red flag symptoms. No sign of infection or DVT.  Check labs.  Counseling on reducing sodium in diet, avoiding prolonged sitting, wearing compression stockings and elevated legs when sitting.  Continue Lasix prn.  Follow up if worsening   I am having Carolann Littler maintain his aspirin EC, naproxen, gentamicin cream, Cholecalciferol, cetirizine, triamcinolone, sildenafil, furosemide, traMADol, rosuvastatin, triamterene-hydrochlorothiazide, levothyroxine, gabapentin, pantoprazole, phentermine, and solifenacin.  No orders of the defined types were placed in this encounter.

## 2023-02-16 NOTE — Patient Instructions (Signed)
Please go downstairs for labs.   Cut back on salt and foods high in sodium. Avoid using salt at home.   Avoid sitting for long periods. Get up and walk every hour.   When you are sitting, elevate your legs.   Wear compression stockings   Use the fluid pill as needed.   Follow up with Dr. Jonny Ruiz if you are having any new or worsening symptoms.    DASH Eating Plan DASH stands for Dietary Approaches to Stop Hypertension. The DASH eating plan is a healthy eating plan that has been shown to: Lower high blood pressure (hypertension). Reduce your risk for type 2 diabetes, heart disease, and stroke. Help with weight loss. What are tips for following this plan? Reading food labels Check food labels for the amount of salt (sodium) per serving. Choose foods with less than 5 percent of the Daily Value (DV) of sodium. In general, foods with less than 300 milligrams (mg) of sodium per serving fit into this eating plan. To find whole grains, look for the word "whole" as the first word in the ingredient list. Shopping Buy products labeled as "low-sodium" or "no salt added." Buy fresh foods. Avoid canned foods and pre-made or frozen meals. Cooking Try not to add salt when you cook. Use salt-free seasonings or herbs instead of table salt or sea salt. Check with your health care provider or pharmacist before using salt substitutes. Do not fry foods. Cook foods in healthy ways, such as baking, boiling, grilling, roasting, or broiling. Cook using oils that are good for your heart. These include olive, canola, avocado, soybean, and sunflower oil. Meal planning  Eat a balanced diet. This should include: 4 or more servings of fruits and 4 or more servings of vegetables each day. Try to fill half of your plate with fruits and vegetables. 6-8 servings of whole grains each day. 6 or less servings of lean meat, poultry, or fish each day. 1 oz is 1 serving. A 3 oz (85 g) serving of meat is about the same size  as the palm of your hand. One egg is 1 oz (28 g). 2-3 servings of low-fat dairy each day. One serving is 1 cup (237 mL). 1 serving of nuts, seeds, or beans 5 times each week. 2-3 servings of heart-healthy fats. Healthy fats called omega-3 fatty acids are found in foods such as walnuts, flaxseeds, fortified milks, and eggs. These fats are also found in cold-water fish, such as sardines, salmon, and mackerel. Limit how much you eat of: Canned or prepackaged foods. Food that is high in trans fat, such as fried foods. Food that is high in saturated fat, such as fatty meat. Desserts and other sweets, sugary drinks, and other foods with added sugar. Full-fat dairy products. Do not salt foods before eating. Do not eat more than 4 egg yolks a week. Try to eat at least 2 vegetarian meals a week. Eat more home-cooked food and less restaurant, buffet, and fast food. Lifestyle When eating at a restaurant, ask if your food can be made with less salt or no salt. If you drink alcohol: Limit how much you have to: 0-1 drink a day if you are male. 0-2 drinks a day if you are male. Know how much alcohol is in your drink. In the [place] one drink is one 12 oz bottle of beer (355 mL), one 5 oz glass of wine (148 mL), or one 1 oz glass of hard liquor (44 mL). General information Avoid  eating more than 2,300 mg of salt a day. If you have hypertension, you may need to reduce your sodium intake to 1,500 mg a day. Work with your provider to stay at a healthy body weight or lose weight. Ask what the best weight range is for you. On most days of the week, get at least 30 minutes of exercise that causes your heart to beat faster. This may include walking, swimming, or biking. Work with your provider or dietitian to adjust your eating plan to meet your specific calorie needs. What foods should I eat? Fruits All fresh, dried, or frozen fruit. Canned fruits that are in their natural juice and do not have sugar added to  them. Vegetables Fresh or frozen vegetables that are raw, steamed, roasted, or grilled. Low-sodium or reduced-sodium tomato and vegetable juice. Low-sodium or reduced-sodium tomato sauce and tomato paste. Low-sodium or reduced-sodium canned vegetables. Grains Whole-grain or whole-wheat bread. Whole-grain or whole-wheat pasta. Brown rice. Orpah Cobb. Bulgur. Whole-grain and low-sodium cereals. Pita bread. Low-fat, low-sodium crackers. Whole-wheat flour tortillas. Meats and other proteins Skinless chicken or [place]. Ground chicken or [place]. Pork with fat trimmed off. Fish and seafood. Egg whites. Dried beans, peas, or lentils. Unsalted nuts, nut butters, and seeds. Unsalted canned beans. Lean cuts of beef with fat trimmed off. Low-sodium, lean precooked or cured meat, such as sausages or meat loaves. Dairy Low-fat (1%) or fat-free (skim) milk. Reduced-fat, low-fat, or fat-free cheeses. Nonfat, low-sodium ricotta or cottage cheese. Low-fat or nonfat yogurt. Low-fat, low-sodium cheese. Fats and oils Soft margarine without trans fats. Vegetable oil. Reduced-fat, low-fat, or light mayonnaise and salad dressings (reduced-sodium). Canola, safflower, olive, avocado, soybean, and sunflower oils. Avocado. Seasonings and condiments Herbs. Spices. Seasoning mixes without salt. Other foods Unsalted popcorn and pretzels. Fat-free sweets. The items listed above may not be all the foods and drinks you can have. Talk to a dietitian to learn more. What foods should I avoid? Fruits Canned fruit in a light or heavy syrup. Fried fruit. Fruit in cream or butter sauce. Vegetables Creamed or fried vegetables. Vegetables in a cheese sauce. Regular canned vegetables that are not marked as low-sodium or reduced-sodium. Regular canned tomato sauce and paste that are not marked as low-sodium or reduced-sodium. Regular tomato and vegetable juices that are not marked as low-sodium or reduced-sodium. Rosita Fire.  Olives. Grains Baked goods made with fat, such as croissants, muffins, or some breads. Dry pasta or rice meal packs. Meats and other proteins Fatty cuts of meat. Ribs. Fried meat. Tomasa Blase. Bologna, salami, and other precooked or cured meats, such as sausages or meat loaves, that are not lean and low in sodium. Fat from the back of a pig (fatback). Bratwurst. Salted nuts and seeds. Canned beans with added salt. Canned or smoked fish. Whole eggs or egg yolks. Chicken or [place] with skin. Dairy Whole or 2% milk, cream, and half-and-half. Whole or full-fat cream cheese. Whole-fat or sweetened yogurt. Full-fat cheese. Nondairy creamers. Whipped toppings. Processed cheese and cheese spreads. Fats and oils Butter. Stick margarine. Lard. Shortening. Ghee. Bacon fat. Tropical oils, such as coconut, palm kernel, or palm oil. Seasonings and condiments Onion salt, garlic salt, seasoned salt, table salt, and sea salt. Worcestershire sauce. Tartar sauce. Barbecue sauce. Teriyaki sauce. Soy sauce, including reduced-sodium soy sauce. Steak sauce. Canned and packaged gravies. Fish sauce. Oyster sauce. Cocktail sauce. Store-bought horseradish. Ketchup. Mustard. Meat flavorings and tenderizers. Bouillon cubes. Hot sauces. Pre-made or packaged marinades. Pre-made or packaged taco seasonings. Relishes. Regular salad dressings.  Other foods Salted popcorn and pretzels. The items listed above may not be all the foods and drinks you should avoid. Talk to a dietitian to learn more. Where to find more information National Heart, Lung, and Blood Institute (NHLBI): BuffaloDryCleaner.gl American Heart Association (AHA): heart.org Academy of Nutrition and Dietetics: eatright.org National Kidney Foundation (NKF): kidney.org This information is not intended to replace advice given to you by your health care provider. Make sure you discuss any questions you have with your health care provider. Document Revised: 09/01/2022 Document  Reviewed: 09/01/2022 Elsevier Patient Education  2024 Elsevier Inc.     Peripheral Edema  Peripheral edema is swelling that is caused by a buildup of fluid. Peripheral edema most often affects the lower legs, ankles, and feet. It can also develop in the arms, hands, and face. The area of the body that has peripheral edema will look swollen. It may also feel heavy or warm. Your clothes may start to feel tight. Pressing on the area may make a temporary dent in your skin (pitting edema). You may not be able to move your swollen arm or leg as much as usual. There are many causes of peripheral edema. It can happen because of a complication of other conditions such as heart failure, kidney disease, or a problem with your circulation. It also can be a side effect of certain medicines or happen because of an infection. It often happens to women during pregnancy. Sometimes, the cause is not known. Follow these instructions at home: Managing pain, stiffness, and swelling  Raise (elevate) your legs while you are sitting or lying down. Move around often to prevent stiffness and to reduce swelling. Do not sit or stand for long periods of time. Do not wear tight clothing. Do not wear garters on your upper legs. Exercise your legs to get your circulation going. This helps to move the fluid back into your blood vessels, and it may help the swelling go down. Wear compression stockings as told by your health care provider. These stockings help to prevent blood clots and reduce swelling in your legs. It is important that these are the correct size. These stockings should be prescribed by your doctor to prevent possible injuries. If elastic bandages or wraps are recommended, use them as told by your health care provider. Medicines Take over-the-counter and prescription medicines only as told by your health care provider. Your health care provider may prescribe medicine to help your body get rid of excess water  (diuretic). Take this medicine if you are told to take it. General instructions Eat a low-salt (low-sodium) diet as told by your health care provider. Sometimes, eating less salt may reduce swelling. Pay attention to any changes in your symptoms. Moisturize your skin daily to help prevent skin from cracking and draining. Keep all follow-up visits. This is important. Contact a health care provider if: You have a fever. You have swelling in only one leg. You have increased swelling, redness, or pain in one or both of your legs. You have drainage or sores at the area where you have edema. Get help right away if: You have edema that starts suddenly or is getting worse, especially if you are pregnant or have a medical condition. You develop shortness of breath, especially when you are lying down. You have pain in your chest or abdomen. You feel weak. You feel like you will faint. These symptoms may be an emergency. Get help right away. Call 911. Do not wait to see if  the symptoms will go away. Do not drive yourself to the hospital. Summary Peripheral edema is swelling that is caused by a buildup of fluid. Peripheral edema most often affects the lower legs, ankles, and feet. Move around often to prevent stiffness and to reduce swelling. Do not sit or stand for long periods of time. Pay attention to any changes in your symptoms. Contact a health care provider if you have edema that starts suddenly or is getting worse, especially if you are pregnant or have a medical condition. Get help right away if you develop shortness of breath, especially when lying down. This information is not intended to replace advice given to you by your health care provider. Make sure you discuss any questions you have with your health care provider. Document Revised: 04/19/2021 Document Reviewed: 04/19/2021 Elsevier Patient Education  2024 [employer].

## 2023-02-24 ENCOUNTER — Other Ambulatory Visit: Payer: Self-pay | Admitting: Family Medicine

## 2023-02-24 ENCOUNTER — Other Ambulatory Visit: Payer: Self-pay | Admitting: Internal Medicine

## 2023-02-24 NOTE — Telephone Encounter (Signed)
Rx refill request approved per Dr. Corey's orders. 

## 2023-03-14 ENCOUNTER — Other Ambulatory Visit: Payer: Self-pay

## 2023-03-14 ENCOUNTER — Other Ambulatory Visit: Payer: Self-pay | Admitting: Internal Medicine

## 2023-03-27 ENCOUNTER — Other Ambulatory Visit: Payer: Self-pay | Admitting: Family Medicine

## 2023-03-27 NOTE — Telephone Encounter (Signed)
Last OV 07/27/22 Next OV not scheduled  Last refill 02/24/23 Qty #90/0

## 2023-04-02 ENCOUNTER — Other Ambulatory Visit: Payer: Self-pay | Admitting: Internal Medicine

## 2023-04-06 ENCOUNTER — Ambulatory Visit: Payer: 59 | Admitting: Internal Medicine

## 2023-04-10 ENCOUNTER — Other Ambulatory Visit: Payer: Self-pay | Admitting: Internal Medicine

## 2023-04-10 ENCOUNTER — Other Ambulatory Visit: Payer: Self-pay

## 2023-04-25 ENCOUNTER — Other Ambulatory Visit: Payer: Self-pay

## 2023-04-25 MED ORDER — SOLIFENACIN SUCCINATE 5 MG PO TABS: 5.0000 mg | ORAL_TABLET | Freq: Every day | ORAL | 3 refills | Status: DC

## 2023-04-27 ENCOUNTER — Telehealth: Payer: Self-pay | Admitting: Internal Medicine

## 2023-04-27 NOTE — Telephone Encounter (Signed)
Prescription Request  04/27/2023  LOV: 10/06/2022  What is the name of the medication or equipment? furosemide (LASIX) 20 MG tablet [  rosuvastatin (CRESTOR) 10 MG tablet  pantoprazole (PROTONIX) 40 MG tablet   levothyroxine (SYNTHROID) 75 MCG tablet [  triamterene-hydrochlorothiazide (MAXZIDE) 75-50 MG tablet  Have you contacted your pharmacy to request a refill? No   Which pharmacy would you like this sent to?    [facility] - [place], [place] - [address] [place] [address] Phone: [phone] Fax: [phone]  Patient notified that their request is being sent to the clinical staff for review and that they should receive a response within 2 business days.   Please advise at Mobile [phone] (mobile)

## 2023-04-27 NOTE — Telephone Encounter (Signed)
Refill too soon

## 2023-05-05 ENCOUNTER — Other Ambulatory Visit: Payer: Self-pay

## 2023-05-05 MED ORDER — LEVOTHYROXINE SODIUM 75 MCG PO TABS: 75.0000 ug | ORAL_TABLET | Freq: Every day | ORAL | 3 refills | Status: DC

## 2023-05-05 MED ORDER — PANTOPRAZOLE SODIUM 40 MG PO TBEC: 40.0000 mg | DELAYED_RELEASE_TABLET | Freq: Every day | ORAL | 3 refills | Status: DC

## 2023-05-05 MED ORDER — FUROSEMIDE 20 MG PO TABS: 20.0000 mg | ORAL_TABLET | Freq: Every day | ORAL | 11 refills | Status: DC | PRN

## 2023-05-16 ENCOUNTER — Other Ambulatory Visit: Payer: Self-pay | Admitting: Internal Medicine

## 2023-05-17 ENCOUNTER — Other Ambulatory Visit: Payer: Self-pay

## 2023-05-17 ENCOUNTER — Other Ambulatory Visit: Payer: Self-pay | Admitting: Internal Medicine

## 2023-07-05 ENCOUNTER — Other Ambulatory Visit: Payer: Self-pay | Admitting: Family Medicine

## 2023-07-05 NOTE — Telephone Encounter (Signed)
Pt was prescribed this rx over a year ago and has not been seen for a f/u. Refill not appropriate per Dr. Zollie Pee standing orders.

## 2023-07-08 ENCOUNTER — Other Ambulatory Visit: Payer: Self-pay | Admitting: Internal Medicine

## 2023-07-10 ENCOUNTER — Other Ambulatory Visit: Payer: Self-pay

## 2023-07-25 ENCOUNTER — Other Ambulatory Visit: Payer: Self-pay | Admitting: Internal Medicine

## 2023-08-17 ENCOUNTER — Other Ambulatory Visit: Payer: Self-pay | Admitting: Family Medicine

## 2023-08-18 ENCOUNTER — Telehealth: Payer: Self-pay

## 2023-08-18 ENCOUNTER — Other Ambulatory Visit: Payer: Self-pay

## 2023-08-18 MED ORDER — SOLIFENACIN SUCCINATE 5 MG PO TABS: 5.0000 mg | ORAL_TABLET | Freq: Every day | ORAL | 0 refills | Status: DC

## 2023-08-18 MED ORDER — GABAPENTIN 300 MG PO CAPS: 300.0000 mg | ORAL_CAPSULE | Freq: Three times a day (TID) | ORAL | 2 refills | Status: DC

## 2023-08-18 NOTE — Telephone Encounter (Signed)
Refill sent for meds that needed, other meds not time for refill.

## 2023-08-18 NOTE — Telephone Encounter (Signed)
Pt has not been seen for this condition for over a year. Needs f/u appointment to re-evaluate treatment plan.

## 2023-08-18 NOTE — Telephone Encounter (Signed)
     Department: [facility]     Visit Type: OFFICE VISIT     Date: 02/16/2023        Medication: gabapentin (NEURONTIN) 300 MG capsule, cetirizine (ZYRTEC) 10 MG tablet, solifenacin (VESICARE) 5 MG tablet, rosuvastatin (CRESTOR) 10 MG tablet        Has the patient contacted their pharmacy? Yes, pharmacy has been trying to reach office    (Agent: If no, request that the patient contact the pharmacy for the refill. If patient does not wish to contact the pharmacy document the reason why and proceed with request.)    (Agent: If yes, when and what did the pharmacy advise?)        Is this the correct pharmacy for this prescription? Yes    If no, delete pharmacy and type the correct one.    This is the patient's preferred pharmacy:    [facility] [address] ([address] [place] - [address]    [address]    [place] [address]    Phone: [phone] Fax: [phone]        [facility] - [place], [place] - [address]    [address]    [place] [address]    Phone: [phone] Fax: [phone]            Has the prescription been filled recently? Yes        Is the patient out of the medication? Yes        Has the patient been seen for an appointment in the last year OR does the patient have an upcoming appointment? Yes        Can we respond through MyChart? Yes        Agent: Please be advised that Rx refills may take up to 3 business days. We ask that you follow-up with your pharmacy.

## 2023-08-24 ENCOUNTER — Other Ambulatory Visit: Payer: Self-pay | Admitting: Internal Medicine

## 2023-09-17 ENCOUNTER — Other Ambulatory Visit: Payer: Self-pay | Admitting: Internal Medicine

## 2023-09-18 ENCOUNTER — Other Ambulatory Visit: Payer: Self-pay

## 2023-09-20 ENCOUNTER — Telehealth: Payer: Self-pay | Admitting: Internal Medicine

## 2023-09-20 NOTE — Telephone Encounter (Signed)
Called and let Pt know it was sent in on 09/18/23.

## 2023-09-20 NOTE — Telephone Encounter (Signed)
Copied from CRM [id number]. Topic: Clinical - Prescription Issue >> Sep 20, 2023 10:27 AM Michael Khan wrote: Reason for CRM: Patient calling to check the status of refill for his solifenacin (VESICARE) 5 MG tablet, system shows medication has been discontinued by provider. Please reach out to patient for some clarification.   Michael Khan [phone]

## 2023-10-04 ENCOUNTER — Ambulatory Visit: Payer: 59 | Admitting: Internal Medicine

## 2023-10-04 ENCOUNTER — Encounter: Payer: Self-pay | Admitting: Internal Medicine

## 2023-10-04 VITALS — BP 136/80 | HR 82 | Temp 98.6°F | Ht 68.0 in | Wt 297.0 lb

## 2023-10-04 DIAGNOSIS — E538 Deficiency of other specified B group vitamins: Secondary | ICD-10-CM | POA: Diagnosis not present

## 2023-10-04 DIAGNOSIS — R739 Hyperglycemia, unspecified: Secondary | ICD-10-CM | POA: Diagnosis not present

## 2023-10-04 DIAGNOSIS — E7800 Pure hypercholesterolemia, unspecified: Secondary | ICD-10-CM

## 2023-10-04 DIAGNOSIS — M545 Low back pain, unspecified: Secondary | ICD-10-CM | POA: Diagnosis not present

## 2023-10-04 DIAGNOSIS — Z0000 Encounter for general adult medical examination without abnormal findings: Secondary | ICD-10-CM | POA: Diagnosis not present

## 2023-10-04 DIAGNOSIS — G5603 Carpal tunnel syndrome, bilateral upper limbs: Secondary | ICD-10-CM | POA: Insufficient documentation

## 2023-10-04 DIAGNOSIS — H268 Other specified cataract: Secondary | ICD-10-CM

## 2023-10-04 DIAGNOSIS — Z125 Encounter for screening for malignant neoplasm of prostate: Secondary | ICD-10-CM | POA: Diagnosis not present

## 2023-10-04 DIAGNOSIS — E559 Vitamin D deficiency, unspecified: Secondary | ICD-10-CM | POA: Diagnosis not present

## 2023-10-04 DIAGNOSIS — I10 Essential (primary) hypertension: Secondary | ICD-10-CM

## 2023-10-04 DIAGNOSIS — Z0001 Encounter for general adult medical examination with abnormal findings: Secondary | ICD-10-CM

## 2023-10-04 DIAGNOSIS — H269 Unspecified cataract: Secondary | ICD-10-CM | POA: Insufficient documentation

## 2023-10-04 LAB — VITAMIN B12: Vitamin B-12: >1537 pg/mL — ABNORMAL HIGH (ref 211–911)

## 2023-10-04 LAB — CBC WITH DIFFERENTIAL/PLATELET
Basophils Absolute: 0.1 10*3/uL (ref 0.0–0.1)
Basophils Relative: 0.7 % (ref 0.0–3.0)
Eosinophils Absolute: 0.5 10*3/uL (ref 0.0–0.7)
Eosinophils Relative: 5.5 % — ABNORMAL HIGH (ref 0.0–5.0)
HCT: 41.9 % (ref 39.0–52.0)
Hemoglobin: 14 g/dL (ref 13.0–17.0)
Lymphocytes Relative: 24.8 % (ref 12.0–46.0)
Lymphs Abs: 2.2 10*3/uL (ref 0.7–4.0)
MCHC: 33.4 g/dL (ref 30.0–36.0)
MCV: 91.7 fL (ref 78.0–100.0)
Monocytes Absolute: 1.1 10*3/uL — ABNORMAL HIGH (ref 0.1–1.0)
Monocytes Relative: 12.3 % — ABNORMAL HIGH (ref 3.0–12.0)
Neutro Abs: 5 10*3/uL (ref 1.4–7.7)
Neutrophils Relative %: 56.7 % (ref 43.0–77.0)
Platelets: 278 10*3/uL (ref 150.0–400.0)
RBC: 4.56 Mil/uL (ref 4.22–5.81)
RDW: 14.4 % (ref 11.5–15.5)
WBC: 8.9 10*3/uL (ref 4.0–10.5)

## 2023-10-04 LAB — VITAMIN D 25 HYDROXY (VIT D DEFICIENCY, FRACTURES): VITD: 74.26 ng/mL (ref 30.00–100.00)

## 2023-10-04 LAB — LIPID PANEL
Cholesterol: 127 mg/dL (ref 0–200)
HDL: 35.6 mg/dL — ABNORMAL LOW (ref >39.00)
LDL Cholesterol: 65 mg/dL (ref 0–99)
NonHDL: 91.77
Total CHOL/HDL Ratio: 4
Triglycerides: 132 mg/dL (ref 0.0–149.0)
VLDL: 26.4 mg/dL (ref 0.0–40.0)

## 2023-10-04 LAB — URINALYSIS, ROUTINE W REFLEX MICROSCOPIC
Bilirubin Urine: NEGATIVE
Hgb urine dipstick: NEGATIVE
Ketones, ur: NEGATIVE
Leukocytes,Ua: NEGATIVE
Nitrite: NEGATIVE
Specific Gravity, Urine: 1.025 (ref 1.000–1.030)
Total Protein, Urine: NEGATIVE
Urine Glucose: NEGATIVE
Urobilinogen, UA: 0.2 (ref 0.0–1.0)
pH: 5.5 (ref 5.0–8.0)

## 2023-10-04 LAB — PSA: PSA: 0.36 ng/mL (ref 0.10–4.00)

## 2023-10-04 LAB — MICROALBUMIN / CREATININE URINE RATIO
Creatinine,U: 129.8 mg/dL
Microalb Creat Ratio: 0.6 mg/g (ref 0.0–30.0)
Microalb, Ur: 0.8 mg/dL (ref 0.0–1.9)

## 2023-10-04 LAB — BASIC METABOLIC PANEL
BUN: 23 mg/dL (ref 6–23)
CO2: 29 meq/L (ref 19–32)
Calcium: 9.5 mg/dL (ref 8.4–10.5)
Chloride: 100 meq/L (ref 96–112)
Creatinine, Ser: 1.3 mg/dL (ref 0.40–1.50)
GFR: 56.5 mL/min — ABNORMAL LOW (ref >60.00)
Glucose, Bld: 131 mg/dL — ABNORMAL HIGH (ref 70–99)
Potassium: 4 meq/L (ref 3.5–5.1)
Sodium: 138 meq/L (ref 135–145)

## 2023-10-04 LAB — HEPATIC FUNCTION PANEL
ALT: 23 U/L (ref 0–53)
AST: 23 U/L (ref 0–37)
Albumin: 3.9 g/dL (ref 3.5–5.2)
Alkaline Phosphatase: 66 U/L (ref 39–117)
Bilirubin, Direct: 0.1 mg/dL (ref 0.0–0.3)
Total Bilirubin: 0.7 mg/dL (ref 0.2–1.2)
Total Protein: 6.9 g/dL (ref 6.0–8.3)

## 2023-10-04 LAB — HEMOGLOBIN A1C: Hgb A1c MFr Bld: 7 % — ABNORMAL HIGH (ref 4.6–6.5)

## 2023-10-04 LAB — TSH: TSH: 4.69 u[IU]/mL (ref 0.35–5.50)

## 2023-10-04 NOTE — Progress Notes (Signed)
 Patient ID: Michael Khan, male   DOB: [birth year], 68 y.o.   MRN: [id number]         Chief Complaint:: wellness exam and bilateral CTS, hyperglycemia and cataract, low b12, hld, lbp, low vit d       HPI:  Michael Khan is a 68 y.o. male here for wellness exam; declines colonoscopy and all immunizations, o/w up to date                        Also has 2 mo worsening bilateral pain numbness and weakness to both hands.  Pt denies chest pain, increased sob or doe, wheezing, orthopnea, PND, increased LE swelling, palpitations, dizziness or syncope.   Pt denies polydipsia, polyuria, or new focal neuro s/s.    Pt denies fever, wt loss, night sweats, loss of appetite, or other constitutional symptoms  Pt continues to have recurring LBP without change in severity, bowel or bladder change, fever, wt loss,  worsening LE pain/numbness/weakness, gait change or falls.     Wt Readings from Last 3 Encounters:  10/04/23 297 lb (134.7 kg)  02/16/23 293 lb (132.9 kg)  10/06/22 300 lb (136.1 kg)   BP Readings from Last 3 Encounters:  10/04/23 136/80  02/16/23 (!) 142/86  10/06/22 122/82   Immunization History  Administered Date(s) Administered   PFIZER(Purple Top)SARS-COV-2 Vaccination 11/28/2019, 12/23/2019, 06/23/2020   Health Maintenance Due  Topic Date Due   Medicare Annual Wellness (AWV)  Never done   Pneumonia Vaccine 71+ Years old (1 of 2 - PCV) Never done   DTaP/Tdap/Td (1 - Tdap) Never done   Zoster Vaccines- Shingrix (1 of 2) Never done   OPHTHALMOLOGY EXAM  10/09/2018   Colonoscopy  05/16/2021      Past Medical History:  Diagnosis Date   Guillain Barr syndrome [facility])    When he was 64, he received a flu shot caused him to develop Guillain Barre Syndrome   Hyperlipemia    Hypothyroidism 07/24/2019   Measles    Past Surgical History:  Procedure Laterality Date   ANKLE RECONSTRUCTION     CHOLECYSTECTOMY     NOSE SURGERY     WRIST RECONSTRUCTION      reports that he has never  smoked. He has never used smokeless tobacco. He reports that he does not drink alcohol and does not use drugs. family history includes Healthy in his father; Liver cancer in his mother. Allergies  Allergen Reactions   Drug Class [Haemophilus Influenzae Vaccines] Other (See Comments)    Caused to not walk   Lipitor [Atorvastatin  Calcium ]     discomfort   Current Outpatient Medications on File Prior to Visit  Medication Sig Dispense Refill   aspirin  81 MG EC tablet Take 1 tablet (81 mg total) by mouth daily. Swallow whole. 30 tablet 12   cetirizine  (ZYRTEC ) 10 MG tablet Take 1 tablet by mouth once daily 30 tablet 11   Cholecalciferol 50 MCG (2000 UT) CAPS Take by mouth.     furosemide  (LASIX ) 20 MG tablet Take 1 tablet (20 mg total) by mouth daily as needed. 30 tablet 11   gabapentin  (NEURONTIN ) 300 MG capsule Take 1 capsule (300 mg total) by mouth 3 (three) times daily. 90 capsule 2   gentamicin  cream (GARAMYCIN ) 0.1 % Apply 1 application topically 2 (two) times daily. 30 g 1   levothyroxine  (SYNTHROID ) 75 MCG tablet Take 1 tablet (75 mcg total) by mouth daily. 90 tablet 3  naproxen  (NAPROSYN ) 500 MG tablet Take 1 tablet (500 mg total) by mouth 2 (two) times daily as needed for moderate pain. 60 tablet 2   pantoprazole  (PROTONIX ) 40 MG tablet Take 1 tablet (40 mg total) by mouth daily. 90 tablet 3   phentermine  37.5 MG capsule Take 1 capsule by mouth in the morning 30 capsule 2   rosuvastatin  (CRESTOR ) 10 MG tablet Take 1 tablet (10 mg total) by mouth daily. 90 tablet 3   sildenafil  (VIAGRA ) 100 MG tablet Take 0.5-1 tablets (50-100 mg total) by mouth daily as needed for erectile dysfunction. 5 tablet 11   solifenacin  (VESICARE ) 5 MG tablet Take 1 tablet by mouth once daily 30 tablet 0   traMADol  (ULTRAM ) 50 MG tablet TAKE 1 TABLET BY MOUTH EVERY 6 HOURS AS NEEDED 60 tablet 2   triamcinolone  (NASACORT ) 55 MCG/ACT AERO nasal inhaler Place 2 sprays into the nose daily. 1 each 12    triamterene -hydrochlorothiazide (MAXZIDE) 75-50 MG tablet Take 1 tablet by mouth once daily 90 tablet 3   No current facility-administered medications on file prior to visit.        ROS:  All others reviewed and negative.  Objective        PE:  BP 136/80 (BP Location: Right Arm, Patient Position: Sitting, Cuff Size: Normal)   Pulse 82   Temp 98.6 F (37 C) (Oral)   Ht 5' 8 (1.727 m)   Wt 297 lb (134.7 kg)   SpO2 98%   BMI 45.16 kg/m                 Constitutional: Pt appears in NAD               HENT: Head: NCAT.                Right Ear: External ear normal.                 Left Ear: External ear normal.                Eyes: . Pupils are equal, round, and reactive to light. Conjunctivae and EOM are normal               Nose: without d/c or deformity               Neck: Neck supple. Gross normal ROM               Cardiovascular: Normal rate and regular rhythm.                 Pulmonary/Chest: Effort normal and breath sounds without rales or wheezing.                Abd:  Soft, NT, ND, + BS, no organomegaly               Neurological: Pt is alert. At baseline orientation, motor grossly intact               Skin: Skin is warm. No rashes, no other new lesions, LE edema - none               Psychiatric: Pt behavior is normal without agitation   Micro: none  Cardiac tracings I have personally interpreted today:  none  Pertinent Radiological findings (summarize): none   Lab Results  Component Value Date   WBC 8.9 10/04/2023   HGB 14.0 10/04/2023   HCT 41.9 10/04/2023   PLT 278.0 10/04/2023  GLUCOSE 131 (H) 10/04/2023   CHOL 127 10/04/2023   TRIG 132.0 10/04/2023   HDL 35.60 (L) 10/04/2023   LDLDIRECT 79.0 10/05/2021   LDLCALC 65 10/04/2023   ALT 23 10/04/2023   AST 23 10/04/2023   NA 138 10/04/2023   K 4.0 10/04/2023   CL 100 10/04/2023   CREATININE 1.30 10/04/2023   BUN 23 10/04/2023   CO2 29 10/04/2023   TSH 4.69 10/04/2023   PSA 0.36 10/04/2023   INR 1.0  10/03/2008   HGBA1C 7.0 (H) 10/04/2023   MICROALBUR 0.8 10/04/2023   Assessment/Plan:  [name] is a 68 y.o. Black or African American [2] male with  has a past medical history of Guillain Barr syndrome (HCC), Hyperlipemia, Hypothyroidism (07/24/2019), and Measles.  Encounter for routine adult medical exam with abnormal findings Age and sex appropriate education and counseling updated with regular exercise and diet Referrals for preventative services - declines colonoscopy Immunizations addressed - declines all immunizations Smoking counseling  - none needed Evidence for depression or other mood disorder - none significant Most recent labs reviewed. I have personally reviewed and have noted: 1) the patient's medical and social history 2) The patient's current medications and supplements 3) The patient's height, weight, and BMI have been recorded in the chart   B12 deficiency Lab Results  Component Value Date   VITAMINB12 >1537 (H) 10/04/2023   Stable, cont oral replacement - b12 1000 mcg qd   HLD (hyperlipidemia) Lab Results  Component Value Date   LDLCALC 65 10/04/2023   Stable, pt to continue current statin crestor  10 mg qd   Hyperglycemia Lab Results  Component Value Date   HGBA1C 7.0 (H) 10/04/2023   Uncontrolled,, pt to continue current medical treatment  - diet, wt control, decliens OHA for now   Hypertension BP Readings from Last 3 Encounters:  10/04/23 136/80  02/16/23 (!) 142/86  10/06/22 122/82   Stable, pt to continue medical treatment maxide 75  - 1 qd   Low back pain Stable overall, cont current med tx  Vitamin D  deficiency Last vitamin D  Lab Results  Component Value Date   VD25OH 74.26 10/04/2023   Stable, cont oral replacement   Bilateral carpal tunnel syndrome With overt worsening subjective x 2 mo - for referral hand surgury  Cataract Due for f/u, also hx of hyperglycemia - for optho referral  Followup: Return in about 6  months (around 04/02/2024).  Lynwood Rush, MD 10/08/2023 2:57 PM [facility]

## 2023-10-04 NOTE — Patient Instructions (Addendum)
 You will be contacted regarding the referral for: eye doctor, and hand surgury  Please continue all other medications as before, and refills have been done if requested.  Please have the pharmacy call with any other refills you may need.  Please continue your efforts at being more active, low cholesterol diet, and weight control.  You are otherwise up to date with prevention measures today.  Please keep your appointments with your specialists as you may have planned  Please go to the LAB at the blood drawing area for the tests to be done  You will be contacted by phone if any changes need to be made immediately.  Otherwise, you will receive a letter about your results with an explanation, but please check with MyChart first.  Please make an Appointment to return in 6 months, or sooner if needed

## 2023-10-04 NOTE — Progress Notes (Signed)
 The test results show that your current treatment is OK, as the tests are stable.  Please continue the same plan.  There is no other need for change of treatment or further evaluation based on these results, at this time.  thanks

## 2023-10-06 ENCOUNTER — Other Ambulatory Visit: Payer: Self-pay | Admitting: Internal Medicine

## 2023-10-08 ENCOUNTER — Encounter: Payer: Self-pay | Admitting: Internal Medicine

## 2023-10-08 NOTE — Assessment & Plan Note (Signed)
 Due for f/u, also hx of hyperglycemia - for optho referral

## 2023-10-08 NOTE — Assessment & Plan Note (Signed)
 Last vitamin D  Lab Results  Component Value Date   VD25OH 74.26 10/04/2023   Stable, cont oral replacement

## 2023-10-08 NOTE — Assessment & Plan Note (Signed)
Stable overall, cont current med tx 

## 2023-10-08 NOTE — Assessment & Plan Note (Signed)
 BP Readings from Last 3 Encounters:  10/04/23 136/80  02/16/23 (!) 142/86  10/06/22 122/82   Stable, pt to continue medical treatment maxide 75  - 1 qd

## 2023-10-08 NOTE — Assessment & Plan Note (Signed)
 Lab Results  Component Value Date   HGBA1C 7.0 (H) 10/04/2023   Uncontrolled,, pt to continue current medical treatment  - diet, wt control, decliens OHA for now

## 2023-10-08 NOTE — Assessment & Plan Note (Signed)
 With overt worsening subjective x 2 mo - for referral hand surgury

## 2023-10-08 NOTE — Assessment & Plan Note (Signed)
 Age and sex appropriate education and counseling updated with regular exercise and diet Referrals for preventative services - declines colonoscopy Immunizations addressed - declines all immunizations Smoking counseling  - none needed Evidence for depression or other mood disorder - none significant Most recent labs reviewed. I have personally reviewed and have noted: 1) the patient's medical and social history 2) The patient's current medications and supplements 3) The patient's height, weight, and BMI have been recorded in the chart

## 2023-10-08 NOTE — Assessment & Plan Note (Signed)
 Lab Results  Component Value Date   LDLCALC 65 10/04/2023   Stable, pt to continue current statin crestor  10 mg qd

## 2023-10-08 NOTE — Assessment & Plan Note (Signed)
 Lab Results  Component Value Date   VITAMINB12 >1537 (H) 10/04/2023   Stable, cont oral replacement - b12 1000 mcg qd

## 2023-10-09 ENCOUNTER — Other Ambulatory Visit: Payer: Self-pay | Admitting: Internal Medicine

## 2023-10-09 MED ORDER — SOLIFENACIN SUCCINATE 5 MG PO TABS: 5.0000 mg | ORAL_TABLET | Freq: Every day | ORAL | 0 refills | Status: DC

## 2023-10-09 NOTE — Telephone Encounter (Signed)
 Copied from CRM [id number]. Topic: Clinical - Medication Refill >> Oct 09, 2023  9:56 AM Margarette Shawl wrote: Most Recent Primary Care Visit:  Provider: Roslyn Coombe  Department: [facility]  Visit Type: PHYSICAL  Date: 10/04/2023  Medication: solifenacin  (VESICARE ) 5 MG tablet  Has the patient contacted their pharmacy? Yes (Agent: If no, request that the patient contact the pharmacy for the refill. If patient does not wish to contact the pharmacy document the reason why and proceed with request.) (Agent: If yes, when and what did the pharmacy advise?)  Is this the correct pharmacy for this prescription? Yes If no, delete pharmacy and type the correct one.  This is the patient's preferred pharmacy:  [facility] [address] ([address] [place] - [address] [place] [address] Phone: [phone] Fax: [phone]  [facility] - [place], [place] - [address] [place] [address] Phone: [phone] Fax: [phone]   Has the prescription been filled recently? Yes  Is the patient out of the medication? No  Has the patient been seen for an appointment in the last year OR does the patient have an upcoming appointment? Yes  Can we respond through MyChart? Yes  Agent: Please be advised that Rx refills may take up to 3 business days. We ask that you follow-up with your pharmacy.

## 2023-10-09 NOTE — Telephone Encounter (Signed)
 Last Fill: 09/18/23  Last OV: 10/04/23 Next OV: None Scheduled  Routing to provider for review/authorization.

## 2023-10-12 ENCOUNTER — Other Ambulatory Visit: Payer: Self-pay

## 2023-10-12 ENCOUNTER — Other Ambulatory Visit: Payer: Self-pay | Admitting: Internal Medicine

## 2023-10-16 ENCOUNTER — Other Ambulatory Visit: Payer: Self-pay

## 2023-10-16 ENCOUNTER — Other Ambulatory Visit: Payer: Self-pay | Admitting: Internal Medicine

## 2023-10-18 ENCOUNTER — Ambulatory Visit (INDEPENDENT_AMBULATORY_CARE_PROVIDER_SITE_OTHER): Payer: 59 | Admitting: Orthopaedic Surgery

## 2023-10-18 DIAGNOSIS — M65321 Trigger finger, right index finger: Secondary | ICD-10-CM

## 2023-10-18 DIAGNOSIS — G5603 Carpal tunnel syndrome, bilateral upper limbs: Secondary | ICD-10-CM | POA: Diagnosis not present

## 2023-10-18 MED ORDER — BUPIVACAINE HCL 0.5 % IJ SOLN
0.3300 mL | INTRAMUSCULAR | Status: AC | PRN
Start: 2023-10-18 — End: 2023-10-18
  Administered 2023-10-18: .33 mL

## 2023-10-18 MED ORDER — METHYLPREDNISOLONE ACETATE 40 MG/ML IJ SUSP
13.3300 mg | INTRAMUSCULAR | Status: AC | PRN
  Administered 2023-10-18: 13.33 mg

## 2023-10-18 MED ORDER — LIDOCAINE HCL 1 % IJ SOLN
0.3000 mL | INTRAMUSCULAR | Status: AC | PRN
  Administered 2023-10-18: .3 mL

## 2023-10-18 NOTE — Progress Notes (Signed)
Office Visit Note   Patient: Michael Khan           Date of Birth: 10-Jun-1956           MRN: [id number] Visit Date: 10/18/2023              Requested by: Corwin Levins, MD [address] [place],  [place] [address] PCP: Corwin Levins, MD   Assessment & Plan: Visit Diagnoses:  1. Bilateral carpal tunnel syndrome   2. Trigger index finger of right hand     Plan: Impression is bilateral hand paresthesias and right index trigger finger.  We have discussed ordering nerve conduction study bilateral upper extremity to assess for carpal tunnel syndrome.  He will follow-up with [place] once this has been completed.  Regards to the trigger finger, discussed cortisone injection and night splinting for which she is agreeable to.  Follow-up as needed.  Follow-Up Instructions: Return if symptoms worsen or fail to improve.   Orders:  No orders of the defined types were placed in this encounter.  No orders of the defined types were placed in this encounter.     Procedures: Hand/UE Inj: R index A1 for trigger finger on 10/18/2023 3:01 PM Indications: pain Details: 25 G needle Medications: 0.3 mL lidocaine 1 %; 0.33 mL bupivacaine 0.5 %; 13.33 mg methylPREDNISolone acetate 40 MG/ML Outcome: tolerated well, no immediate complications Consent was given by the patient. Patient was prepped and draped in the usual sterile fashion.       Clinical Data: No additional findings.   Subjective: Chief Complaint  Patient presents with   Left Hand - Pain   Right Hand - Pain    HPI patient is a pleasant 68 year old right-hand-dominant gentleman who comes in today with paresthesias to the thumb, index and long fingers of both hands as well as triggering to the right index finger.  Paresthesias have been ongoing for about 9 months.  He does note a history of neuropathy as well as Guillain-Barr several decades ago.  In regards to the paresthesias, symptoms appear to be worse at night as well as when  he is driving long distances.  He has started dropping things due to decree sensation.  He has tried copper gloves as well as wrist splints at night with little help.  No previous carpal tunnel or trigger finger injection.  Review of Systems as detailed in HPI.  All others reviewed and are negative.   Objective: Vital Signs: There were no vitals taken for this visit.  Physical Exam well-developed well-nourished gentleman in no acute distress.  Alert and oriented x 3.  Ortho Exam right hand exam: Positive Tinel.  Negative Phalen's.  Decree sensation throughout median nerve distribution.  No thenar atrophy.  Does have mild pain a palpable nodule at the A1 pulley of the index finger.  No reproducible triggering.  Left hand exam: Negative Tinel and negative Phalen.  No thenar atrophy.  He is neurovascularly intact distally.  Specialty Comments:  No specialty comments available.  Imaging: No new imaging   PMFS History: Patient Active Problem List   Diagnosis Date Noted   Bilateral carpal tunnel syndrome 10/04/2023   Cataract 10/04/2023   Hamstring injury 10/06/2022   Erectile dysfunction 07/15/2022   Right leg pain 08/01/2021   Bilateral leg and foot pain 12/23/2020   Paresthesia of both lower extremities 12/23/2020   B12 deficiency 12/23/2020   Acute postoperative pain 12/01/2020   Avascular necrosis of bone of  left hip (HCC) 12/01/2020   At risk for sleep apnea 11/05/2020   Urinary frequency 10/02/2020   HLD (hyperlipidemia) 08/05/2020   Vitamin D deficiency 06/24/2020   Morbid obesity (HCC) 06/24/2020   Ingrown nail of great toe of left foot 06/24/2020   Right hip pain 11/28/2019   Hypothyroidism 07/24/2019   Umbilical hernia without obstruction and without gangrene 07/24/2019   Wart 07/24/2019   Right groin pain 07/24/2019   Low back pain 01/14/2019   Acute sinus infection 05/16/2018   Hypertension 01/27/2018   Leg swelling 01/24/2018   Hyperglycemia 11/08/2017    Corns/callosities 11/08/2017   Muscle cramps 01/30/2017   Hx of Guillain-Barre syndrome 06/15/2016   Encounter for routine adult medical exam with abnormal findings 02/17/2016   Rash and nonspecific skin eruption 02/17/2016   Allergic rhinitis 04/10/2015   Past Medical History:  Diagnosis Date   Guillain Barr syndrome [facility])    When he was 50, he received a flu shot caused him to develop Guillain Barre Syndrome   Hyperlipemia    Hypothyroidism 07/24/2019   Measles     Family History  Problem Relation Age of Onset   Liver cancer Mother    Healthy Father    Colon cancer Neg Hx     Past Surgical History:  Procedure Laterality Date   ANKLE RECONSTRUCTION     CHOLECYSTECTOMY     NOSE SURGERY     WRIST RECONSTRUCTION     Social History   Occupational History   Occupation: Security  Tobacco Use   Smoking status: Never   Smokeless tobacco: Never  Substance and Sexual Activity   Alcohol use: No   Drug use: No   Sexual activity: Yes

## 2023-10-20 ENCOUNTER — Ambulatory Visit: Payer: Self-pay | Admitting: Internal Medicine

## 2023-10-20 NOTE — Telephone Encounter (Signed)
    Chief Complaint: Urinary frequency, not emptying bladder "but I do that sometimes." No pain. Symptoms: Above Frequency: Yesterday Pertinent Negatives: Patient denies fever Disposition: [] ED /[] Urgent Care (no appt availability in office) / [x] Appointment(In office/virtual)/ []  [place] [facility]/ [] Home Care/ [] Refused Recommended Disposition /[] [place] [facility]/ []  Follow-up with PCP Additional Notes: Agrees with appointment. Will go to UC for worsening of symptoms over the weekend.  Reason for Disposition  Urinating more frequently than usual (i.e., frequency)  Answer Assessment - Initial Assessment Questions 1. SYMPTOM: "What's the main symptom you're concerned about?" (e.g., frequency, incontinence)     Frequency, not emptying well 2. ONSET: "When did the  Yesterday  start?"     Yesterday 3. PAIN: "Is there any pain?" If Yes, ask: "How bad is it?" (Scale: 1-10; mild, moderate, severe)     No 4. CAUSE: "What do you think is causing the symptoms?"     Unsure 5. OTHER SYMPTOMS: "Do you have any other symptoms?" (e.g., blood in urine, fever, flank pain, pain with urination)     Dark urine 6. PREGNANCY: "Is there any chance you are pregnant?" "When was your last menstrual period?"     N/a  Protocols used: Urinary Symptoms-A-AH

## 2023-10-23 ENCOUNTER — Ambulatory Visit (INDEPENDENT_AMBULATORY_CARE_PROVIDER_SITE_OTHER): Payer: 59

## 2023-10-23 ENCOUNTER — Ambulatory Visit (INDEPENDENT_AMBULATORY_CARE_PROVIDER_SITE_OTHER): Payer: 59 | Admitting: Family Medicine

## 2023-10-23 ENCOUNTER — Encounter: Payer: Self-pay | Admitting: Family Medicine

## 2023-10-23 VITALS — BP 144/86 | HR 84 | Temp 98.6°F | Ht 68.0 in | Wt 279.6 lb

## 2023-10-23 DIAGNOSIS — R062 Wheezing: Secondary | ICD-10-CM

## 2023-10-23 DIAGNOSIS — R0981 Nasal congestion: Secondary | ICD-10-CM

## 2023-10-23 DIAGNOSIS — J069 Acute upper respiratory infection, unspecified: Secondary | ICD-10-CM | POA: Diagnosis not present

## 2023-10-23 DIAGNOSIS — R051 Acute cough: Secondary | ICD-10-CM | POA: Diagnosis not present

## 2023-10-23 DIAGNOSIS — R300 Dysuria: Secondary | ICD-10-CM

## 2023-10-23 DIAGNOSIS — B9689 Other specified bacterial agents as the cause of diseases classified elsewhere: Secondary | ICD-10-CM

## 2023-10-23 DIAGNOSIS — R0989 Other specified symptoms and signs involving the circulatory and respiratory systems: Secondary | ICD-10-CM | POA: Diagnosis not present

## 2023-10-23 DIAGNOSIS — B370 Candidal stomatitis: Secondary | ICD-10-CM

## 2023-10-23 DIAGNOSIS — R059 Cough, unspecified: Secondary | ICD-10-CM | POA: Diagnosis not present

## 2023-10-23 LAB — POC URINALSYSI DIPSTICK (AUTOMATED)
Bilirubin, UA: NEGATIVE
Blood, UA: NEGATIVE
Glucose, UA: NEGATIVE
Ketones, UA: NEGATIVE
Leukocytes, UA: NEGATIVE
Nitrite, UA: NEGATIVE
Protein, UA: POSITIVE — AB
Spec Grav, UA: >=1.03 — AB (ref 1.010–1.025)
Urobilinogen, UA: 1 U/dL — AB
pH, UA: 6 (ref 5.0–8.0)

## 2023-10-23 MED ORDER — NYSTATIN 100000 UNIT/ML MT SUSP
5.0000 mL | Freq: Four times a day (QID) | OROMUCOSAL | 0 refills | Status: AC
Start: 2023-10-23 — End: ?

## 2023-10-23 MED ORDER — AZITHROMYCIN 250 MG PO TABS: ORAL_TABLET | ORAL | 0 refills | Status: AC

## 2023-10-23 NOTE — Progress Notes (Signed)
 Acute Office Visit  Subjective:     Patient ID: Michael Khan, male    DOB: 1955-10-12, 68 y.o.   MRN: [id number]  Chief Complaint  Patient presents with   Urinary Tract Infection    Patient has been having bouts of needing to urinate but not being able to urinate for the past week. Patient had experiencing whiteness on the tongue for the past week. As well as cough to the point where they were getting dizzy. Notes of some chest congestion as well. Hot flashes for the past week    HPI Patient is in today for possible UTI.  Reports that he is having some urgency and hesitancy. States this has since resolved.  Denies current dysuria, hematuria, urgency or hesitancy.  Reports cough and congestion for the last week. Reports coughing up thick yellow mucus. Reports that he is feeling more fatigued and weak over the last week. 24-year-old grandson lives with him, states that child has had a virus recently as well. Denies abdominal pain, nausea, vomiting, diarrhea, rash, fever, other symptoms. Medical history as outlined below.  ROS Per HPI      Objective:    BP (!) 144/86   Pulse 84   Temp 98.6 F (37 C)   Ht 5\' 8"  (1.727 m)   Wt 279 lb 9.6 oz (126.8 kg)   SpO2 96%   BMI 42.51 kg/m    Physical Exam Vitals and nursing note reviewed.  Constitutional:      General: He is not in acute distress.    Appearance: He is obese.     Comments: Appears fatigued  HENT:     Head: Normocephalic and atraumatic.     Right Ear: Tympanic membrane and ear canal normal.     Nose: Nose normal.  Eyes:     Extraocular Movements: Extraocular movements intact.  Cardiovascular:     Rate and Rhythm: Normal rate.     Heart sounds: Normal heart sounds.  Pulmonary:     Effort: Pulmonary effort is normal. No respiratory distress.     Breath sounds: Wheezing and rhonchi present. No rales.  Abdominal:     General: There is no distension.     Palpations: There is no mass.     Tenderness: There  is no abdominal tenderness. There is no right CVA tenderness, left CVA tenderness, guarding or rebound.     Hernia: No hernia is present.  Musculoskeletal:        General: Normal range of motion.     Cervical back: Normal range of motion.  Lymphadenopathy:     Cervical: No cervical adenopathy.  Skin:    General: Skin is warm.  Neurological:     Mental Status: He is alert.    Results for orders placed or performed in visit on 10/23/23  POCT Urinalysis Dipstick (Automated)  Result Value Ref Range   Color, UA     Clarity, UA     Glucose, UA Negative Negative   Bilirubin, UA negative    Ketones, UA negative    Spec Grav, UA >=1.030 (A) 1.010 - 1.025   Blood, UA negative    pH, UA 6.0 5.0 - 8.0   Protein, UA Positive (A) Negative   Urobilinogen, UA 1.0 (A) 0.2 or 1.0 E.U./dL   Nitrite, UA negative    Leukocytes, UA Negative Negative        Assessment & Plan:  1. Bacterial URI (Primary)  - azithromycin (ZITHROMAX) 250 MG tablet; Take  2 tablets on day 1, then 1 tablet daily on days 2 through 5  Dispense: 6 tablet; Refill: 0  2. Dysuria  - POCT Urinalysis Dipstick (Automated) - Urine Culture  3. Acute cough  - DG Chest 2 View; Future  4. Wheezing  - DG Chest 2 View; Future  5. Nasal congestion  -May use OTC Flonase  6. Thrush  - nystatin (MYCOSTATIN) 100000 UNIT/ML suspension; Take 5 mLs (500,000 Units total) by mouth 4 (four) times daily.  Dispense: 60 mL; Refill: 0   Meds ordered this encounter  Medications   azithromycin (ZITHROMAX) 250 MG tablet    Sig: Take 2 tablets on day 1, then 1 tablet daily on days 2 through 5    Dispense:  6 tablet    Refill:  0   nystatin (MYCOSTATIN) 100000 UNIT/ML suspension    Sig: Take 5 mLs (500,000 Units total) by mouth 4 (four) times daily.    Dispense:  60 mL    Refill:  0    Return if symptoms worsen or fail to improve.  Moshe Cipro, FNP

## 2023-10-23 NOTE — Patient Instructions (Addendum)
 I am going to send your urine for culture for confirmation.  We will be in contact with results that require further attention.  I have sent in azithromycin for you to take.  Take 2 tablets today, then 1 tablet daily for the next 4 days.  I have sent in nystatin for you to use 5 mL to swish and spit 4 times per day for the next week.  We are getting an xray today. We will be in contact with any abnormal results that require further attention.  Please follow-up with [place] if symptoms or not improving

## 2023-10-24 LAB — URINE CULTURE: Result:: NO GROWTH

## 2023-10-25 ENCOUNTER — Ambulatory Visit (INDEPENDENT_AMBULATORY_CARE_PROVIDER_SITE_OTHER): Payer: 59 | Admitting: Physical Medicine and Rehabilitation

## 2023-10-25 ENCOUNTER — Encounter: Payer: Self-pay | Admitting: Family Medicine

## 2023-10-25 ENCOUNTER — Encounter: Payer: Self-pay | Admitting: Physical Medicine and Rehabilitation

## 2023-10-25 DIAGNOSIS — Z8669 Personal history of other diseases of the nervous system and sense organs: Secondary | ICD-10-CM | POA: Diagnosis not present

## 2023-10-25 DIAGNOSIS — M79642 Pain in left hand: Secondary | ICD-10-CM

## 2023-10-25 DIAGNOSIS — R531 Weakness: Secondary | ICD-10-CM | POA: Diagnosis not present

## 2023-10-25 DIAGNOSIS — M79641 Pain in right hand: Secondary | ICD-10-CM | POA: Diagnosis not present

## 2023-10-25 DIAGNOSIS — R202 Paresthesia of skin: Secondary | ICD-10-CM | POA: Diagnosis not present

## 2023-10-25 NOTE — Progress Notes (Signed)
 Michael Khan - 68 y.o. male MRN [id number]  Date of birth: 12/27/1955  Office Visit Note: Visit Date: 10/25/2023 PCP: Corwin Levins, MD Referred by: Tarry Kos, MD  Subjective: Chief Complaint  Patient presents with   Right Hand - Numbness, Weakness   Left Hand - Numbness, Weakness   HPI: Michael Khan is a 68 y.o. male who comes in today at the request of Dr. Glee Arvin for evaluation and management of chronic, worsening and severe pain, numbness and tingling in the Bilateral upper extremities.  Patient is Right hand dominant.  Reports chronic many year history but several months of just worsening bilateral hand pain with numbness in all the digits of both hands globally.  When pressed more he does feel like maybe the radial digits are more numb than the other but he really endorses frank numbness to the touch.  He does endorse some nocturnal complaints more than daytime and also worse with driving for long distances.  He does have to shake his hands and move his hands around to get any of the feeling to come back.  He has had anti-inflammatories and gabapentin as well as wearing copper gloves without really relief.  He has had no specific recent trauma.  He has had trauma to the left hand and forearm as he used to be a security guard for many years.  He also has an interesting complicated history of Guillain-Barr many years ago as well as history of thyroid disease.  He does not carry a diagnosis of diabetes but his hemoglobin A1c was 7.0 not long ago.  He is not taking medications for diabetes.   I spent more than 30 minutes speaking face-to-face with the patient with 50% of the time in counseling and discussing coordination of care.      Review of Systems  Musculoskeletal:  Positive for joint pain and neck pain.  Neurological:  Positive for tingling and focal weakness.  All other systems reviewed and are negative.  Otherwise per HPI.  Assessment & Plan: Visit Diagnoses:     ICD-10-CM   1. Paresthesia of skin  R20.2 NCV with EMG (electromyography)    2. Bilateral hand pain  M79.641    M79.642     3. Weakness  R53.1     4. Hx of Guillain-Barre syndrome  Z86.69        Plan: Impression: Clinically complicated with history of Guillain-Barr and history of elevated hemoglobin A1c numbers and a possible thought that he had had peripheral neuropathy at some point.  He does seem to have clinical signs and symptoms of a median neuropathy at the wrist bilaterally.  Electrodiagnostic study performed today.  The above electrodiagnostic study is ABNORMAL and reveals evidence of a severe bilateral median nerve entrapment at the wrist (carpal tunnel syndrome) affecting sensory and motor components.   There is no significant electrodiagnostic evidence of any other focal nerve entrapment, brachial plexopathy or cervical radiculopathy. Ulnar sensory slowing likely temperature artifact.  Recommendations: 1.  Follow-up with referring physician. 2.  Continue current management of symptoms. 3.  Suggest surgical evaluation.  Meds & Orders: No orders of the defined types were placed in this encounter.   Orders Placed This Encounter  Procedures   NCV with EMG (electromyography)    Follow-up: Return for  Glee Arvin, MD.   Procedures: No procedures performed  EMG & NCV Findings: Evaluation of the left median motor and the right median motor nerves showed prolonged distal  onset latency (L5.9, R6.4 ms), reduced amplitude (L4.5, R4.6 mV), and decreased conduction velocity (Elbow-Wrist, L39, R44 m/s).  The left median (across palm) sensory and the right median (across palm) sensory nerves showed no response (Palm) and prolonged distal peak latency (L6.0, R6.5 ms).  The left ulnar sensory and the right ulnar sensory nerves showed prolonged distal peak latency (L4.5, R4.1 ms) and decreased conduction velocity (Wrist-5th Digit, L31, R34 m/s).  All remaining nerves (as indicated in the  following tables) were within normal limits.  All left vs. right side differences were within normal limits.    Needle evaluation of the right abductor pollicis brevis muscle showed increased insertional activity, moderately increased spontaneous activity, and diminished recruitment.  All remaining muscles (as indicated in the following table) showed no evidence of electrical instability.    Impression: The above electrodiagnostic study is ABNORMAL and reveals evidence of a severe bilateral median nerve entrapment at the wrist (carpal tunnel syndrome) affecting sensory and motor components.   There is no significant electrodiagnostic evidence of any other focal nerve entrapment, brachial plexopathy or cervical radiculopathy. Ulnar sensory slowing likely temperature artifact.  Recommendations: 1.  Follow-up with referring physician. 2.  Continue current management of symptoms. 3.  Suggest surgical evaluation.  ___________________________ Naaman Plummer FAAPMR Board Certified, American Board of Physical Medicine and Rehabilitation    Nerve Conduction Studies Anti Sensory Summary Table   Stim Site NR Peak (ms) Norm Peak (ms) P-T Amp (V) Norm P-T Amp Site1 Site2 Delta-P (ms) Dist (cm) Vel (m/s) Norm Vel (m/s)  Left Median Acr Palm Anti Sensory (2nd Digit)  29.4C  Wrist    *6.0 <3.6 16.4 >10 Wrist Palm  0.0    Palm *NR  <2.0          Right Median Acr Palm Anti Sensory (2nd Digit)  29.7C  Wrist    *6.5 <3.6 20.9 >10 Wrist Palm  0.0    Palm *NR  <2.0          Left Radial Anti Sensory (Base 1st Digit)  29.2C  Wrist    2.5 <3.1 22.7  Wrist Base 1st Digit 2.5 0.0    Right Radial Anti Sensory (Base 1st Digit)  29C  Wrist    2.6 <3.1 27.5  Wrist Base 1st Digit 2.6 0.0    Left Ulnar Anti Sensory (5th Digit)  29.7C  Wrist    *4.5 <3.7 28.3 >15.0 Wrist 5th Digit 4.5 14.0 *31 >38  Right Ulnar Anti Sensory (5th Digit)  29.6C  Wrist    *4.1 <3.7 15.9 >15.0 Wrist 5th Digit 4.1 14.0 *34 >38    Motor Summary Table   Stim Site NR Onset (ms) Norm Onset (ms) O-P Amp (mV) Norm O-P Amp Site1 Site2 Delta-0 (ms) Dist (cm) Vel (m/s) Norm Vel (m/s)  Left Median Motor (Abd Poll Brev)  29.5C  Wrist    *5.9 <4.2 *4.5 >5 Elbow Wrist 5.7 22.0 *39 >50  Elbow    11.6  1.9         Right Median Motor (Abd Poll Brev)  28.9C  Wrist    *6.4 <4.2 *4.6 >5 Elbow Wrist 5.5 24.0 *44 >50  Elbow    11.9  2.9         Left Ulnar Motor (Abd Dig Min)  29.6C  Wrist    3.6 <4.2 10.2 >3 B Elbow Wrist 4.0 21.0 53 >53  B Elbow    7.6  9.0  A Elbow B Elbow 1.9 11.0 58 >  53  A Elbow    9.5  8.3         Right Ulnar Motor (Abd Dig Min)  29C  Wrist    3.8 <4.2 9.8 >3 B Elbow Wrist 3.8 21.0 55 >53  B Elbow    7.6  9.6  A Elbow B Elbow 2.0 11.0 55 >53  A Elbow    9.6  9.4          EMG   Side Muscle Nerve Root Ins Act Fibs Psw Amp Dur Poly Recrt Int Dennie Bible Comment  Right Abd Poll Brev Median C8-T1 *Incr *2+ *2+ Nml Nml 0 *Reduced Nml   Right 1stDorInt Ulnar C8-T1 Nml Nml Nml Nml Nml 0 Nml Nml   Right PronatorTeres Median C6-7 Nml Nml Nml Nml Nml 0 Nml Nml   Right Biceps Musculocut C5-6 Nml Nml Nml Nml Nml 0 Nml Nml     Nerve Conduction Studies Anti Sensory Left/Right Comparison   Stim Site L Lat (ms) R Lat (ms) L-R Lat (ms) L Amp (V) R Amp (V) L-R Amp (%) Site1 Site2 L Vel (m/s) R Vel (m/s) L-R Vel (m/s)  Median Acr Palm Anti Sensory (2nd Digit)  29.4C  Wrist *6.0 *6.5 0.5 16.4 20.9 21.5 Wrist Palm     Palm             Radial Anti Sensory (Base 1st Digit)  29.2C  Wrist 2.5 2.6 0.1 22.7 27.5 17.5 Wrist Base 1st Digit     Ulnar Anti Sensory (5th Digit)  29.7C  Wrist *4.5 *4.1 0.4 28.3 15.9 43.8 Wrist 5th Digit *31 *34 3   Motor Left/Right Comparison   Stim Site L Lat (ms) R Lat (ms) L-R Lat (ms) L Amp (mV) R Amp (mV) L-R Amp (%) Site1 Site2 L Vel (m/s) R Vel (m/s) L-R Vel (m/s)  Median Motor (Abd Poll Brev)  29.5C  Wrist *5.9 *6.4 0.5 *4.5 *4.6 2.2 Elbow Wrist *39 *44 5  Elbow 11.6 11.9 0.3 1.9 2.9  34.5       Ulnar Motor (Abd Dig Min)  29.6C  Wrist 3.6 3.8 0.2 10.2 9.8 3.9 B Elbow Wrist 53 55 2  B Elbow 7.6 7.6 0.0 9.0 9.6 6.2 A Elbow B Elbow 58 55 3  A Elbow 9.5 9.6 0.1 8.3 9.4 11.7          Waveforms:                     Clinical History: No specialty comments available.   He reports that he has never smoked. He has never used smokeless tobacco.  Recent Labs    10/04/23 1127  HGBA1C 7.0*    Objective:  VS:  HT:    WT:   BMI:     BP:   HR: bpm  TEMP: ( )  RESP:  Physical Exam Vitals and nursing note reviewed.  Constitutional:      General: He is not in acute distress.    Appearance: Normal appearance. He is well-developed. He is obese.  HENT:     Head: Normocephalic and atraumatic.  Eyes:     Conjunctiva/sclera: Conjunctivae normal.     Pupils: Pupils are equal, round, and reactive to light.  Cardiovascular:     Rate and Rhythm: Normal rate.     Pulses: Normal pulses.     Heart sounds: Normal heart sounds.  Pulmonary:     Effort: Pulmonary effort is normal. No respiratory distress.  Musculoskeletal:  General: Tenderness present.     Cervical back: Normal range of motion and neck supple. Tenderness present. No rigidity.     Right lower leg: No edema.     Left lower leg: No edema.     Comments: Inspection reveals flattening of the bilateral APB's but no atrophy of the bilateral FDI or hand intrinsics. There is no swelling, color changes, allodynia or dystrophic changes. There is 5 out of 5 strength in the bilateral wrist extension, finger abduction and long finger flexion. There is i impaired sensation in the radial digits more than her ulnar..  There is a negative Tinel's test at the bilateral wrist and elbow. There is a positive Phalen's test bilaterally. There is a negative Hoffmann's test bilaterally.  Skin:    General: Skin is warm and dry.     Findings: No erythema or rash.     Comments: Well-healed traumatic scars on the left hand and  forearm.  Neurological:     General: No focal deficit present.     Mental Status: He is alert and oriented to person, place, and time.     Cranial Nerves: No cranial nerve deficit.     Sensory: Sensory deficit present.     Motor: No weakness or abnormal muscle tone.     Coordination: Coordination normal.     Gait: Gait normal.  Psychiatric:        Mood and Affect: Mood normal.        Behavior: Behavior normal.        Thought Content: Thought content normal.     Ortho Exam  Imaging: No results found.  Past Medical/Family/Surgical/Social History: Medications & Allergies reviewed per EMR, new medications updated. Patient Active Problem List   Diagnosis Date Noted   Bilateral carpal tunnel syndrome 10/04/2023   Cataract 10/04/2023   Hamstring injury 10/06/2022   Erectile dysfunction 07/15/2022   Right leg pain 08/01/2021   Bilateral leg and foot pain 12/23/2020   Paresthesia of both lower extremities 12/23/2020   B12 deficiency 12/23/2020   Acute postoperative pain 12/01/2020   Avascular necrosis of bone of left hip (HCC) 12/01/2020   At risk for sleep apnea 11/05/2020   Urinary frequency 10/02/2020   HLD (hyperlipidemia) 08/05/2020   Vitamin D deficiency 06/24/2020   Morbid obesity (HCC) 06/24/2020   Ingrown nail of great toe of left foot 06/24/2020   Right hip pain 11/28/2019   Hypothyroidism 07/24/2019   Umbilical hernia without obstruction and without gangrene 07/24/2019   Wart 07/24/2019   Right groin pain 07/24/2019   Low back pain 01/14/2019   Acute sinus infection 05/16/2018   Hypertension 01/27/2018   Leg swelling 01/24/2018   Hyperglycemia 11/08/2017   Corns/callosities 11/08/2017   Muscle cramps 01/30/2017   Hx of Guillain-Barre syndrome 06/15/2016   Encounter for routine adult medical exam with abnormal findings 02/17/2016   Rash and nonspecific skin eruption 02/17/2016   Allergic rhinitis 04/10/2015   Past Medical History:  Diagnosis Date   Guillain  Barr syndrome [facility])    When he was 23, he received a flu shot caused him to develop Guillain Barre Syndrome   Hyperlipemia    Hypothyroidism 07/24/2019   Measles    Family History  Problem Relation Age of Onset   Liver cancer Mother    Healthy Father    Colon cancer Neg Hx    Past Surgical History:  Procedure Laterality Date   ANKLE RECONSTRUCTION     CHOLECYSTECTOMY  NOSE SURGERY     WRIST RECONSTRUCTION     Social History   Occupational History   Occupation: Security  Tobacco Use   Smoking status: Never   Smokeless tobacco: Never  Substance and Sexual Activity   Alcohol use: No   Drug use: No   Sexual activity: Yes

## 2023-10-25 NOTE — Progress Notes (Signed)
 Pain score---6 Right hand dominate  C/O numbness and tingling bilateral hands arounf index finger and pinky finger

## 2023-10-25 NOTE — Procedures (Signed)
 EMG & NCV Findings: Evaluation of the left median motor and the right median motor nerves showed prolonged distal onset latency (L5.9, R6.4 ms), reduced amplitude (L4.5, R4.6 mV), and decreased conduction velocity (Elbow-Wrist, L39, R44 m/s).  The left median (across palm) sensory and the right median (across palm) sensory nerves showed no response (Palm) and prolonged distal peak latency (L6.0, R6.5 ms).  The left ulnar sensory and the right ulnar sensory nerves showed prolonged distal peak latency (L4.5, R4.1 ms) and decreased conduction velocity (Wrist-5th Digit, L31, R34 m/s).  All remaining nerves (as indicated in the following tables) were within normal limits.  All left vs. right side differences were within normal limits.    Needle evaluation of the right abductor pollicis brevis muscle showed increased insertional activity, moderately increased spontaneous activity, and diminished recruitment.  All remaining muscles (as indicated in the following table) showed no evidence of electrical instability.    Impression: The above electrodiagnostic study is ABNORMAL and reveals evidence of a severe bilateral median nerve entrapment at the wrist (carpal tunnel syndrome) affecting sensory and motor components.   There is no significant electrodiagnostic evidence of any other focal nerve entrapment, brachial plexopathy or cervical radiculopathy. Ulnar sensory slowing likely temperature artifact.  Recommendations: 1.  Follow-up with referring physician. 2.  Continue current management of symptoms. 3.  Suggest surgical evaluation.  ___________________________ Naaman Plummer FAAPMR Board Certified, American Board of Physical Medicine and Rehabilitation    Nerve Conduction Studies Anti Sensory Summary Table   Stim Site NR Peak (ms) Norm Peak (ms) P-T Amp (V) Norm P-T Amp Site1 Site2 Delta-P (ms) Dist (cm) Vel (m/s) Norm Vel (m/s)  Left Median Acr Palm Anti Sensory (2nd Digit)  29.4C  Wrist    *6.0  <3.6 16.4 >10 Wrist Palm  0.0    Palm *NR  <2.0          Right Median Acr Palm Anti Sensory (2nd Digit)  29.7C  Wrist    *6.5 <3.6 20.9 >10 Wrist Palm  0.0    Palm *NR  <2.0          Left Radial Anti Sensory (Base 1st Digit)  29.2C  Wrist    2.5 <3.1 22.7  Wrist Base 1st Digit 2.5 0.0    Right Radial Anti Sensory (Base 1st Digit)  29C  Wrist    2.6 <3.1 27.5  Wrist Base 1st Digit 2.6 0.0    Left Ulnar Anti Sensory (5th Digit)  29.7C  Wrist    *4.5 <3.7 28.3 >15.0 Wrist 5th Digit 4.5 14.0 *31 >38  Right Ulnar Anti Sensory (5th Digit)  29.6C  Wrist    *4.1 <3.7 15.9 >15.0 Wrist 5th Digit 4.1 14.0 *34 >38   Motor Summary Table   Stim Site NR Onset (ms) Norm Onset (ms) O-P Amp (mV) Norm O-P Amp Site1 Site2 Delta-0 (ms) Dist (cm) Vel (m/s) Norm Vel (m/s)  Left Median Motor (Abd Poll Brev)  29.5C  Wrist    *5.9 <4.2 *4.5 >5 Elbow Wrist 5.7 22.0 *39 >50  Elbow    11.6  1.9         Right Median Motor (Abd Poll Brev)  28.9C  Wrist    *6.4 <4.2 *4.6 >5 Elbow Wrist 5.5 24.0 *44 >50  Elbow    11.9  2.9         Left Ulnar Motor (Abd Dig Min)  29.6C  Wrist    3.6 <4.2 10.2 >3 B Elbow Wrist 4.0 21.0 53 >  53  B Elbow    7.6  9.0  A Elbow B Elbow 1.9 11.0 58 >53  A Elbow    9.5  8.3         Right Ulnar Motor (Abd Dig Min)  29C  Wrist    3.8 <4.2 9.8 >3 B Elbow Wrist 3.8 21.0 55 >53  B Elbow    7.6  9.6  A Elbow B Elbow 2.0 11.0 55 >53  A Elbow    9.6  9.4          EMG   Side Muscle Nerve Root Ins Act Fibs Psw Amp Dur Poly Recrt Int Dennie Bible Comment  Right Abd Poll Brev Median C8-T1 *Incr *2+ *2+ Nml Nml 0 *Reduced Nml   Right 1stDorInt Ulnar C8-T1 Nml Nml Nml Nml Nml 0 Nml Nml   Right PronatorTeres Median C6-7 Nml Nml Nml Nml Nml 0 Nml Nml   Right Biceps Musculocut C5-6 Nml Nml Nml Nml Nml 0 Nml Nml     Nerve Conduction Studies Anti Sensory Left/Right Comparison   Stim Site L Lat (ms) R Lat (ms) L-R Lat (ms) L Amp (V) R Amp (V) L-R Amp (%) Site1 Site2 L Vel (m/s) R Vel (m/s) L-R Vel  (m/s)  Median Acr Palm Anti Sensory (2nd Digit)  29.4C  Wrist *6.0 *6.5 0.5 16.4 20.9 21.5 Wrist Palm     Palm             Radial Anti Sensory (Base 1st Digit)  29.2C  Wrist 2.5 2.6 0.1 22.7 27.5 17.5 Wrist Base 1st Digit     Ulnar Anti Sensory (5th Digit)  29.7C  Wrist *4.5 *4.1 0.4 28.3 15.9 43.8 Wrist 5th Digit *31 *34 3   Motor Left/Right Comparison   Stim Site L Lat (ms) R Lat (ms) L-R Lat (ms) L Amp (mV) R Amp (mV) L-R Amp (%) Site1 Site2 L Vel (m/s) R Vel (m/s) L-R Vel (m/s)  Median Motor (Abd Poll Brev)  29.5C  Wrist *5.9 *6.4 0.5 *4.5 *4.6 2.2 Elbow Wrist *39 *44 5  Elbow 11.6 11.9 0.3 1.9 2.9 34.5       Ulnar Motor (Abd Dig Min)  29.6C  Wrist 3.6 3.8 0.2 10.2 9.8 3.9 B Elbow Wrist 53 55 2  B Elbow 7.6 7.6 0.0 9.0 9.6 6.2 A Elbow B Elbow 58 55 3  A Elbow 9.5 9.6 0.1 8.3 9.4 11.7          Waveforms:

## 2023-11-01 ENCOUNTER — Other Ambulatory Visit: Payer: Self-pay | Admitting: Internal Medicine

## 2023-11-07 ENCOUNTER — Other Ambulatory Visit: Payer: Self-pay | Admitting: Internal Medicine

## 2023-11-08 ENCOUNTER — Ambulatory Visit: Payer: 59 | Admitting: Orthopaedic Surgery

## 2023-11-13 ENCOUNTER — Other Ambulatory Visit: Payer: Self-pay | Admitting: Internal Medicine

## 2023-11-16 ENCOUNTER — Other Ambulatory Visit: Payer: Self-pay | Admitting: Internal Medicine

## 2023-11-17 ENCOUNTER — Other Ambulatory Visit: Payer: Self-pay

## 2023-12-07 ENCOUNTER — Other Ambulatory Visit: Payer: Self-pay

## 2023-12-07 ENCOUNTER — Other Ambulatory Visit: Payer: Self-pay | Admitting: Internal Medicine

## 2023-12-13 ENCOUNTER — Other Ambulatory Visit: Payer: Self-pay

## 2023-12-13 ENCOUNTER — Other Ambulatory Visit: Payer: Self-pay | Admitting: Internal Medicine

## 2023-12-18 ENCOUNTER — Other Ambulatory Visit: Payer: Self-pay | Admitting: Internal Medicine

## 2023-12-18 ENCOUNTER — Other Ambulatory Visit: Payer: Self-pay

## 2023-12-21 ENCOUNTER — Ambulatory Visit: Payer: Self-pay

## 2023-12-21 ENCOUNTER — Ambulatory Visit: Admitting: Internal Medicine

## 2023-12-21 ENCOUNTER — Encounter: Payer: Self-pay | Admitting: Internal Medicine

## 2023-12-21 VITALS — BP 120/68 | HR 105 | Temp 98.6°F | Ht 68.0 in | Wt 302.0 lb

## 2023-12-21 DIAGNOSIS — T7840XA Allergy, unspecified, initial encounter: Secondary | ICD-10-CM

## 2023-12-21 DIAGNOSIS — I10 Essential (primary) hypertension: Secondary | ICD-10-CM | POA: Diagnosis not present

## 2023-12-21 DIAGNOSIS — R051 Acute cough: Secondary | ICD-10-CM

## 2023-12-21 DIAGNOSIS — R062 Wheezing: Secondary | ICD-10-CM | POA: Diagnosis not present

## 2023-12-21 DIAGNOSIS — R739 Hyperglycemia, unspecified: Secondary | ICD-10-CM

## 2023-12-21 DIAGNOSIS — R059 Cough, unspecified: Secondary | ICD-10-CM | POA: Insufficient documentation

## 2023-12-21 MED ORDER — ALBUTEROL SULFATE HFA 108 (90 BASE) MCG/ACT IN AERS: 2.0000 | INHALATION_SPRAY | Freq: Four times a day (QID) | RESPIRATORY_TRACT | 0 refills | Status: DC | PRN

## 2023-12-21 MED ORDER — AZITHROMYCIN 250 MG PO TABS: ORAL_TABLET | ORAL | 1 refills | Status: AC

## 2023-12-21 MED ORDER — METHYLPREDNISOLONE ACETATE 80 MG/ML IJ SUSP
80.0000 mg | Freq: Once | INTRAMUSCULAR | Status: AC
  Administered 2023-12-21: 80 mg via INTRAMUSCULAR

## 2023-12-21 MED ORDER — HYDROCODONE BIT-HOMATROP MBR 5-1.5 MG/5ML PO SOLN: 5.0000 mL | Freq: Four times a day (QID) | ORAL | 0 refills | Status: AC | PRN

## 2023-12-21 MED ORDER — PREDNISONE 10 MG PO TABS: ORAL_TABLET | ORAL | 0 refills | Status: DC

## 2023-12-21 NOTE — Patient Instructions (Signed)
You had the steroid shot today  Please take all new medication as prescribed - the antibiotic, cough medicine, prednisone, and inhaler as needed  Please continue all other medications as before, and refills have been done if requested.  Please have the pharmacy call with any other refills you may need.  Please keep your appointments with your specialists as you may have planned

## 2023-12-21 NOTE — Assessment & Plan Note (Signed)
 Lab Results  Component Value Date   HGBA1C 7.0 (H) 10/04/2023   Stable, pt to continue current medical treatment  - diet, wt control

## 2023-12-21 NOTE — Assessment & Plan Note (Signed)
 Mild to mod, for depomedrol 80 mg IM, prednisone taper,  to f/u any worsening symptoms or concerns

## 2023-12-21 NOTE — Assessment & Plan Note (Signed)
 Mild to mod, c/w  bronchitis vs pna, declines cxr,  for antibx course zpack, cough med prn,  to f/u any worsening symptoms or concerns

## 2023-12-21 NOTE — Progress Notes (Signed)
 Patient ID: Michael Khan, male   DOB: [birth year], 68 y.o.   MRN: [id number]        Chief Complaint: follow up cough, wheezing, hyperglycemia, htn       HPI:  Michael Khan is a 68 y.o. male Here with acute onset mild to mod 2-3 days ST, HA, general weakness and malaise, with prod cough greenish sputum with 1-2 days of worsening sob, doe wheezing, but Pt denies chest pain, orthopnea, PND, increased LE swelling, palpitations, dizziness or syncope.   Pt denies polydipsia, polyuria, or new focal neuro s/s.          Wt Readings from Last 3 Encounters:  12/21/23 (!) 302 lb (137 kg)  10/23/23 279 lb 9.6 oz (126.8 kg)  10/04/23 297 lb (134.7 kg)   BP Readings from Last 3 Encounters:  12/21/23 120/68  10/23/23 (!) 144/86  10/04/23 136/80         Past Medical History:  Diagnosis Date   Guillain Barr syndrome [facility])    When he was 55, he received a flu shot caused him to develop Guillain Barre Syndrome   Hyperlipemia    Hypothyroidism 07/24/2019   Measles    Past Surgical History:  Procedure Laterality Date   ANKLE RECONSTRUCTION     CHOLECYSTECTOMY     NOSE SURGERY     WRIST RECONSTRUCTION      reports that he has never smoked. He has never used smokeless tobacco. He reports that he does not drink alcohol and does not use drugs. family history includes Healthy in his father; Liver cancer in his mother. Allergies  Allergen Reactions   Drug Class [Haemophilus Influenzae Vaccines] Other (See Comments)    Caused to not walk   Lipitor [Atorvastatin  Calcium ]     discomfort   Current Outpatient Medications on File Prior to Visit  Medication Sig Dispense Refill   aspirin  81 MG EC tablet Take 1 tablet (81 mg total) by mouth daily. Swallow whole. 30 tablet 12   cetirizine  (ZYRTEC ) 10 MG tablet Take 1 tablet by mouth once daily 30 tablet 11   Cholecalciferol 50 MCG (2000 UT) CAPS Take by mouth.     furosemide  (LASIX ) 20 MG tablet Take 1 tablet (20 mg total) by mouth daily as needed. 30  tablet 11   gabapentin  (NEURONTIN ) 300 MG capsule TAKE 1 CAPSULE BY MOUTH THREE TIMES DAILY 90 capsule 3   gentamicin  cream (GARAMYCIN ) 0.1 % Apply 1 application topically 2 (two) times daily. 30 g 1   levothyroxine  (SYNTHROID ) 75 MCG tablet Take 1 tablet by mouth once daily 90 tablet 3   naproxen  (NAPROSYN ) 500 MG tablet Take 1 tablet (500 mg total) by mouth 2 (two) times daily as needed for moderate pain. 60 tablet 2   nystatin  (MYCOSTATIN ) 100000 UNIT/ML suspension Take 5 mLs (500,000 Units total) by mouth 4 (four) times daily. 60 mL 0   pantoprazole  (PROTONIX ) 40 MG tablet Take 1 tablet (40 mg total) by mouth daily. 90 tablet 3   phentermine  37.5 MG capsule Take 1 capsule by mouth in the morning 30 capsule 4   rosuvastatin  (CRESTOR ) 10 MG tablet Take 1 tablet by mouth once daily 90 tablet 0   sildenafil  (VIAGRA ) 100 MG tablet Take 0.5-1 tablets (50-100 mg total) by mouth daily as needed for erectile dysfunction. 5 tablet 11   solifenacin  (VESICARE ) 5 MG tablet Take 1 tablet by mouth once daily 90 tablet 3   traMADol  (ULTRAM ) 50 MG tablet TAKE  1 TABLET BY MOUTH EVERY 6 HOURS AS NEEDED 60 tablet 2   triamcinolone  (NASACORT ) 55 MCG/ACT AERO nasal inhaler Place 2 sprays into the nose daily. 1 each 12   triamterene -hydrochlorothiazide (MAXZIDE) 75-50 MG tablet Take 1 tablet by mouth once daily 90 tablet 0   No current facility-administered medications on file prior to visit.        ROS:  All others reviewed and negative.  Objective        PE:  BP 120/68 (BP Location: Right Arm, Patient Position: Sitting, Cuff Size: Normal)   Pulse (!) 105   Temp 98.6 F (37 C) (Oral)   Ht 5\' 8"  (1.727 m)   Wt (!) 302 lb (137 kg)   SpO2 96%   BMI 45.92 kg/m                 Constitutional: Pt appears mild ill               HENT: Head: NCAT.                Right Ear: External ear normal.                 Left Ear: External ear normal. Bilat tm's with mild erythema.  Max sinus areas non tender.  Pharynx  with mild erythema, no exudate               Eyes: . Pupils are equal, round, and reactive to light. Conjunctivae and EOM are normal               Nose: without d/c or deformity               Neck: Neck supple. Gross normal ROM               Cardiovascular: Normal rate and regular rhythm.                 Pulmonary/Chest: Effort normal and breath sounds decreased without rales but with mild bilateral wheezing.                              Neurological: Pt is alert. At baseline orientation, motor grossly intact               Skin: Skin is warm. No rashes, no other new lesions, LE edema - none               Psychiatric: Pt behavior is normal without agitation   Micro: none  Cardiac tracings I have personally interpreted today:  none  Pertinent Radiological findings (summarize): none   Lab Results  Component Value Date   WBC 8.9 10/04/2023   HGB 14.0 10/04/2023   HCT 41.9 10/04/2023   PLT 278.0 10/04/2023   GLUCOSE 131 (H) 10/04/2023   CHOL 127 10/04/2023   TRIG 132.0 10/04/2023   HDL 35.60 (L) 10/04/2023   LDLDIRECT 79.0 10/05/2021   LDLCALC 65 10/04/2023   ALT 23 10/04/2023   AST 23 10/04/2023   NA 138 10/04/2023   K 4.0 10/04/2023   CL 100 10/04/2023   CREATININE 1.30 10/04/2023   BUN 23 10/04/2023   CO2 29 10/04/2023   TSH 4.69 10/04/2023   PSA 0.36 10/04/2023   INR 1.0 10/03/2008   HGBA1C 7.0 (H) 10/04/2023   MICROALBUR 0.8 10/04/2023   Assessment/Plan:  Michael Khan is a 68 y.o. Black or African American [2] male  with  has a past medical history of Guillain Barr syndrome (HCC), Hyperlipemia, Hypothyroidism (07/24/2019), and Measles.  Cough Mild to mod, c/w bronchitis vs pna, declines cxr, for antibx course zpack, cough med prn,  to f/u any worsening symptoms or concerns  Wheezing Mild to mod, for depomedrol 80 mg IM, prednisone  taper,  to f/u any worsening symptoms or concerns  Hyperglycemia Lab Results  Component Value Date   HGBA1C 7.0 (H) 10/04/2023    Stable, pt to continue current medical treatment  - diet, wt control   Hypertension BP Readings from Last 3 Encounters:  12/21/23 120/68  10/23/23 (!) 144/86  10/04/23 136/80   Stable, pt to continue medical treatment maxide 75 50 qd  Followup: Return if symptoms worsen or fail to improve.  Rosalia Colonel, MD 12/21/2023 7:36 PM  [facility]

## 2023-12-21 NOTE — Telephone Encounter (Signed)
 Chief Complaint: SOB Symptoms: sinus pain/congestion, productive cough, mild SOB with exertion, chest burning intermittent, hot flashes Frequency: x 1-2 week Pertinent Negatives: Patient denies fever Disposition: [] ED /[] Urgent Care (no appt availability in office) / [x] Appointment(In office/virtual)/ []  [facility]/ [] Home Care/ [] Refused Recommended Disposition /[] [facility]/ []  Follow-up with PCP Additional Notes: Patient states Michael Khan has been taking OTC medications for sinus symptoms for the last 2 weeks. Michael Khan states now Michael Khan gets hot flashes and Michael Khan feels SOB. Patient speaking in full sentences, no labored breathing or wheezing. Patient states Michael Khan feels like the pollen has been really bad in his area and Michael Khan is concerned that his sinus infection turned into a chest/lung infection.  Copied from CRM [id number]. Topic: Clinical - Red Word Triage >> Dec 21, 2023  9:02 AM Michael Khan wrote: Red Word that prompted transfer to Nurse Triage: Patient had sinus infection for two weeks now and has been having breathing issues... can not catch his breath. Never had this problem before. Patient is very active and Michael Khan is getting concern. Patient also stated that Michael Khan is having hot flashes Reason for Disposition  [1] MILD difficulty breathing (e.g., minimal/no SOB at rest, SOB with walking, pulse <100) AND [2] NEW-onset or WORSE than normal  Answer Assessment - Initial Assessment Questions 1. RESPIRATORY STATUS: "Describe your breathing?" (e.g., wheezing, shortness of breath, unable to speak, severe coughing)      SOB.  2. ONSET: "When did this breathing problem begin?"      Beginning of last week.  3. PATTERN "Does the difficult breathing come and go, or has it been constant since it started?"      Comes and goes.  4. SEVERITY: "How bad is your breathing?" (e.g., mild, moderate, severe)    - MILD: No SOB at rest, mild SOB with walking, speaks normally in sentences, can lie down, no retractions,  pulse < 100.    - MODERATE: SOB at rest, SOB with minimal exertion and prefers to sit, cannot lie down flat, speaks in phrases, mild retractions, audible wheezing, pulse 100-120.    - SEVERE: Very SOB at rest, speaks in single words, struggling to breathe, sitting hunched forward, retractions, pulse > 120      Mild, Michael Khan states Michael Khan can walk from his car to a building and notices Michael Khan feels SOB. Patient speaking in full sentences.  5. RECURRENT SYMPTOM: "Have you had difficulty breathing before?" If Yes, ask: "When was the last time?" and "What happened that time?"      Denies.  6. CARDIAC HISTORY: "Do you have any history of heart disease?" (e.g., heart attack, angina, bypass surgery, angioplasty)      Denies.  7. LUNG HISTORY: "Do you have any history of lung disease?"  (e.g., pulmonary embolus, asthma, emphysema)     Denies.  8. CAUSE: "What do you think is causing the breathing problem?"      Patient thinks it could be a lung infection because Michael Khan thinks Michael Khan had a sinus infection over the last 2 weeks.  9. OTHER SYMPTOMS: "Do you have any other symptoms? (e.g., dizziness, runny nose, cough, chest pain, fever)     Michael Khan states Michael Khan gets a burning in his chest that comes and goes as well, intermittent cough.  10. O2 SATURATION MONITOR:  "Do you use an oxygen saturation monitor (pulse oximeter) at home?" If Yes, ask: "What is your reading (oxygen level) today?" "What is your usual oxygen saturation reading?" (e.g., 95%)  Denies.  11. PREGNANCY: "Is there any chance you are pregnant?" "When was your last menstrual period?"       N/A.  12. TRAVEL: "Have you traveled out of the country in the last month?" (e.g., travel history, exposures)       Denies.  Protocols used: Breathing Difficulty-A-AH

## 2023-12-21 NOTE — Assessment & Plan Note (Signed)
 BP Readings from Last 3 Encounters:  12/21/23 120/68  10/23/23 (!) 144/86  10/04/23 136/80   Stable, pt to continue medical treatment maxide 75 50 qd

## 2024-01-14 ENCOUNTER — Other Ambulatory Visit: Payer: Self-pay | Admitting: Internal Medicine

## 2024-01-15 ENCOUNTER — Other Ambulatory Visit: Payer: Self-pay

## 2024-01-17 ENCOUNTER — Other Ambulatory Visit: Payer: Self-pay | Admitting: Internal Medicine

## 2024-01-17 ENCOUNTER — Other Ambulatory Visit: Payer: Self-pay

## 2024-01-19 ENCOUNTER — Other Ambulatory Visit: Payer: Self-pay

## 2024-01-19 ENCOUNTER — Other Ambulatory Visit: Payer: Self-pay | Admitting: Internal Medicine

## 2024-01-22 ENCOUNTER — Other Ambulatory Visit: Payer: Self-pay | Admitting: Internal Medicine

## 2024-01-30 ENCOUNTER — Other Ambulatory Visit: Payer: Self-pay

## 2024-01-30 ENCOUNTER — Telehealth: Payer: Self-pay | Admitting: Internal Medicine

## 2024-01-30 ENCOUNTER — Ambulatory Visit: Payer: Self-pay | Admitting: Internal Medicine

## 2024-01-30 ENCOUNTER — Ambulatory Visit (INDEPENDENT_AMBULATORY_CARE_PROVIDER_SITE_OTHER): Admitting: Internal Medicine

## 2024-01-30 ENCOUNTER — Encounter: Payer: Self-pay | Admitting: Internal Medicine

## 2024-01-30 VITALS — BP 136/78 | HR 100 | Temp 98.1°F | Ht 68.0 in | Wt 295.0 lb

## 2024-01-30 DIAGNOSIS — E559 Vitamin D deficiency, unspecified: Secondary | ICD-10-CM | POA: Diagnosis not present

## 2024-01-30 DIAGNOSIS — E538 Deficiency of other specified B group vitamins: Secondary | ICD-10-CM | POA: Diagnosis not present

## 2024-01-30 DIAGNOSIS — I10 Essential (primary) hypertension: Secondary | ICD-10-CM

## 2024-01-30 DIAGNOSIS — R051 Acute cough: Secondary | ICD-10-CM

## 2024-01-30 DIAGNOSIS — R062 Wheezing: Secondary | ICD-10-CM

## 2024-01-30 MED ORDER — DOXYCYCLINE HYCLATE 100 MG PO TABS: 100.0000 mg | ORAL_TABLET | Freq: Two times a day (BID) | ORAL | 0 refills | Status: DC

## 2024-01-30 MED ORDER — TRIAMTERENE-HCTZ 75-50 MG PO TABS
1.0000 | ORAL_TABLET | Freq: Every day | ORAL | 0 refills | Status: DC
Start: 2024-01-30 — End: 2024-04-30

## 2024-01-30 MED ORDER — HYDROCODONE BIT-HOMATROP MBR 5-1.5 MG/5ML PO SOLN: 5.0000 mL | Freq: Four times a day (QID) | ORAL | 0 refills | Status: AC | PRN

## 2024-01-30 MED ORDER — PREDNISONE 10 MG PO TABS: ORAL_TABLET | ORAL | 0 refills | Status: DC

## 2024-01-30 MED ORDER — BUDESONIDE-FORMOTEROL FUMARATE 160-4.5 MCG/ACT IN AERO: 1.0000 | INHALATION_SPRAY | Freq: Two times a day (BID) | RESPIRATORY_TRACT | 11 refills | Status: AC

## 2024-01-30 MED ORDER — METHYLPREDNISOLONE ACETATE 80 MG/ML IJ SUSP
80.0000 mg | Freq: Once | INTRAMUSCULAR | Status: AC
  Administered 2024-01-30: 80 mg via INTRAMUSCULAR

## 2024-01-30 MED ORDER — ROSUVASTATIN CALCIUM 10 MG PO TABS: 10.0000 mg | ORAL_TABLET | Freq: Every day | ORAL | 0 refills | Status: DC

## 2024-01-30 NOTE — Telephone Encounter (Signed)
 Chief Complaint: Intermittent difficulty breathing  Symptoms: Productive cough, clear thick sputum, hot flashes, intermittent pain in chest after cough, wheezing  Disposition:  [x] Appointment (In office)  Additional Notes: Patient states he was seen in office last month for SOB when walking short distances. Pt states he was told he had an upper respiratory infection. Pt was prescribed a steroid, inhaler, and cough medication. Pt states the symptoms went away but have now come back. Patient scheduled for an appointment today in office. This RN educated pt on home  new-worsening symptoms and when to call back/seek emergent care. Pt verbalized understanding and agrees to plan.    Copied from CRM [id number]. Topic: Clinical - Red Word Triage >> Jan 30, 2024  9:25 AM Albertha Alosa wrote: Red Word that prompted transfer to Nurse Triage: Patient stated he is having difficult breathing, has periods when he is short on breath Reason for Disposition  Continuous (nonstop) coughing interferes with work, school, or sleeping  Answer Assessment - Initial Assessment Questions Chief Complaint: Intermittent difficulty breathing  Symptoms: Productive cough, clear thick sputum, hot flashes, intermittent pain in chest after cough, wheezing  Protocols used: Breathing Difficulty-A-AH

## 2024-01-30 NOTE — Assessment & Plan Note (Signed)
 Last vitamin D  Lab Results  Component Value Date   VD25OH 74.26 10/04/2023   Stable, cont oral replacement

## 2024-01-30 NOTE — Assessment & Plan Note (Signed)
 Mild to mod, for antibx course doxycycline 100 bid, cough med prn, decliens cxr,,  to f/u any worsening symptoms or concerns

## 2024-01-30 NOTE — Patient Instructions (Signed)
 You had the steroid shot today  Please take all new medication as prescribed - the antibiotic, prednisone , and Symbicort steroid inhaler  Please continue all other medications as before, including the albuterol  inhaler as needed as well  Please have the pharmacy call with any other refills you may need.  Please keep your appointments with your specialists as you may have planned

## 2024-01-30 NOTE — Assessment & Plan Note (Signed)
 Lab Results  Component Value Date   VITAMINB12 >1537 (H) 10/04/2023   Stable, cont oral replacement - b12 1000 mcg qd

## 2024-01-30 NOTE — Assessment & Plan Note (Signed)
 BP Readings from Last 3 Encounters:  01/30/24 136/78  12/21/23 120/68  10/23/23 (!) 144/86   Stable, pt to continue medical treatment maxide 75 50 qd

## 2024-01-30 NOTE — Telephone Encounter (Signed)
 Copied from CRM [id number]. Topic: Clinical - Prescription Issue >> Jan 30, 2024  9:29 AM Albertha Alosa wrote: Reason for CRM: Patient called in regarding prescription stated he has request refills but when he went to the pharmacy they was not there to be picked up  rosuvastatin  (CRESTOR ) 10 MG tablet triamterene -hydrochlorothiazide (MAXZIDE) 75-50 MG tablet.   ---  Pt has appointment today

## 2024-01-30 NOTE — Telephone Encounter (Signed)
 Prescriptions have been resent to [facility] pharmacy.

## 2024-01-30 NOTE — Assessment & Plan Note (Signed)
 Mild to mod, for depomedrol 80 mg IM, prednisone  taper, cont albuterol  hfa prn, also add symbicort 160 bid, to f/u any worsening symptoms or concerns

## 2024-01-30 NOTE — Progress Notes (Signed)
 Patient ID: Michael Khan, male   DOB: May 12, 1956, 68 y.o.   MRN: [id number]        Chief Complaint: follow up cough and wheezing, htn,low vit d and b12       HPI:  Michael Khan is a 68 y.o. male here with c/o        Wt Readings from Last 3 Encounters:  01/30/24 295 lb (133.8 kg)  12/21/23 (!) 302 lb (137 kg)  10/23/23 279 lb 9.6 oz (126.8 kg)   BP Readings from Last 3 Encounters:  01/30/24 136/78  12/21/23 120/68  10/23/23 (!) 144/86         Past Medical History:  Diagnosis Date   Guillain Barr syndrome [facility])    When he was 74, he received a flu shot caused him to develop Guillain Barre Syndrome   Hyperlipemia    Hypothyroidism 07/24/2019   Measles    Past Surgical History:  Procedure Laterality Date   ANKLE RECONSTRUCTION     CHOLECYSTECTOMY     NOSE SURGERY     WRIST RECONSTRUCTION      reports that he has never smoked. He has never used smokeless tobacco. He reports that he does not drink alcohol and does not use drugs. family history includes Healthy in his father; Liver cancer in his mother. Allergies  Allergen Reactions   Drug Class [Haemophilus Influenzae Vaccines] Other (See Comments)    Caused to not walk   Lipitor [Atorvastatin  Calcium ]     discomfort   Current Outpatient Medications on File Prior to Visit  Medication Sig Dispense Refill   albuterol  (VENTOLIN  HFA) 108 (90 Base) MCG/ACT inhaler INHALE 2 PUFFS BY MOUTH EVERY 6 HOURS AS NEEDED FOR WHEEZING FOR SHORTNESS OF BREATH 9 g 0   aspirin  81 MG EC tablet Take 1 tablet (81 mg total) by mouth daily. Swallow whole. 30 tablet 12   cetirizine  (ZYRTEC ) 10 MG tablet Take 1 tablet by mouth once daily 30 tablet 11   Cholecalciferol 50 MCG (2000 UT) CAPS Take by mouth.     furosemide  (LASIX ) 20 MG tablet Take 1 tablet (20 mg total) by mouth daily as needed. 30 tablet 11   gabapentin  (NEURONTIN ) 300 MG capsule TAKE 1 CAPSULE BY MOUTH THREE TIMES DAILY 90 capsule 3   gentamicin  cream (GARAMYCIN ) 0.1 % Apply 1  application topically 2 (two) times daily. 30 g 1   levothyroxine  (SYNTHROID ) 75 MCG tablet Take 1 tablet by mouth once daily 90 tablet 3   naproxen  (NAPROSYN ) 500 MG tablet Take 1 tablet (500 mg total) by mouth 2 (two) times daily as needed for moderate pain. 60 tablet 2   nystatin  (MYCOSTATIN ) 100000 UNIT/ML suspension Take 5 mLs (500,000 Units total) by mouth 4 (four) times daily. 60 mL 0   pantoprazole  (PROTONIX ) 40 MG tablet Take 1 tablet (40 mg total) by mouth daily. 90 tablet 3   phentermine  37.5 MG capsule Take 1 capsule by mouth in the morning 30 capsule 4   sildenafil  (VIAGRA ) 100 MG tablet Take 0.5-1 tablets (50-100 mg total) by mouth daily as needed for erectile dysfunction. 5 tablet 11   solifenacin  (VESICARE ) 5 MG tablet Take 1 tablet by mouth once daily 90 tablet 3   traMADol  (ULTRAM ) 50 MG tablet TAKE 1 TABLET BY MOUTH EVERY 6 HOURS AS NEEDED 60 tablet 2   triamcinolone  (NASACORT ) 55 MCG/ACT AERO nasal inhaler Place 2 sprays into the nose daily. 1 each 12   No current facility-administered  medications on file prior to visit.        ROS:  All others reviewed and negative.  Objective        PE:  BP 136/78 (BP Location: Left Arm, Patient Position: Sitting, Cuff Size: Normal)   Pulse 100   Temp 98.1 F (36.7 C) (Oral)   Ht 5\' 8"  (1.727 m)   Wt 295 lb (133.8 kg)   SpO2 98%   BMI 44.85 kg/m                 Constitutional: Pt appears in NAD               HENT: Head: NCAT.                Right Ear: External ear normal.                 Left Ear: External ear normal.                Eyes: . Pupils are equal, round, and reactive to light. Conjunctivae and EOM are normal               Nose: without d/c or deformity               Neck: Neck supple. Gross normal ROM               Cardiovascular: Normal rate and regular rhythm.                 Pulmonary/Chest: Effort normal and breath sounds without rales or wheezing.                Abd:  Soft, NT, ND, + BS, no organomegaly                Neurological: Pt is alert. At baseline orientation, motor grossly intact               Skin: Skin is warm. No rashes, no other new lesions, LE edema - trace bilateral               Psychiatric: Pt behavior is normal without agitation   Micro: none  Cardiac tracings I have personally interpreted today:  none  Pertinent Radiological findings (summarize): none   Lab Results  Component Value Date   WBC 8.9 10/04/2023   HGB 14.0 10/04/2023   HCT 41.9 10/04/2023   PLT 278.0 10/04/2023   GLUCOSE 131 (H) 10/04/2023   CHOL 127 10/04/2023   TRIG 132.0 10/04/2023   HDL 35.60 (L) 10/04/2023   LDLDIRECT 79.0 10/05/2021   LDLCALC 65 10/04/2023   ALT 23 10/04/2023   AST 23 10/04/2023   NA 138 10/04/2023   K 4.0 10/04/2023   CL 100 10/04/2023   CREATININE 1.30 10/04/2023   BUN 23 10/04/2023   CO2 29 10/04/2023   TSH 4.69 10/04/2023   PSA 0.36 10/04/2023   INR 1.0 10/03/2008   HGBA1C 7.0 (H) 10/04/2023   MICROALBUR 0.8 10/04/2023   Assessment/Plan:  SKIPPER DACOSTA is a 68 y.o. Black or African American [2] male with  has a past medical history of Guillain Barr syndrome (HCC), Hyperlipemia, Hypothyroidism (07/24/2019), and Measles.  Hypertension BP Readings from Last 3 Encounters:  01/30/24 136/78  12/21/23 120/68  10/23/23 (!) 144/86   Stable, pt to continue medical treatment maxide 75 50 qd   Vitamin D  deficiency Last vitamin D  Lab Results  Component Value Date   VD25OH 74.26 10/04/2023  Stable, cont oral replacement   B12 deficiency Lab Results  Component Value Date   VITAMINB12 >1537 (H) 10/04/2023   Stable, cont oral replacement - b12 1000 mcg qd   Cough Mild to mod, for antibx course doxycycline 100 bid, cough med prn, decliens cxr,,  to f/u any worsening symptoms or concerns  Wheezing Mild to mod, for depomedrol 80 mg IM, prednisone  taper, cont albuterol  hfa prn, also add symbicort 160 bid, to f/u any worsening symptoms or concerns  Followup:  Return if symptoms worsen or fail to improve.  Rosalia Colonel, MD 01/30/2024 6:49 PM [facility]

## 2024-02-08 ENCOUNTER — Other Ambulatory Visit: Payer: Self-pay | Admitting: Internal Medicine

## 2024-02-08 ENCOUNTER — Other Ambulatory Visit: Payer: Self-pay

## 2024-02-12 ENCOUNTER — Other Ambulatory Visit: Payer: Self-pay | Admitting: Internal Medicine

## 2024-02-16 DIAGNOSIS — H524 Presbyopia: Secondary | ICD-10-CM | POA: Diagnosis not present

## 2024-02-16 DIAGNOSIS — H25813 Combined forms of age-related cataract, bilateral: Secondary | ICD-10-CM | POA: Diagnosis not present

## 2024-02-23 ENCOUNTER — Other Ambulatory Visit: Payer: Self-pay

## 2024-02-23 ENCOUNTER — Telehealth: Payer: Self-pay | Admitting: Internal Medicine

## 2024-02-23 MED ORDER — FUROSEMIDE 20 MG PO TABS: 20.0000 mg | ORAL_TABLET | Freq: Every day | ORAL | 11 refills | Status: AC | PRN

## 2024-02-23 MED ORDER — PANTOPRAZOLE SODIUM 40 MG PO TBEC: 40.0000 mg | DELAYED_RELEASE_TABLET | Freq: Every day | ORAL | 3 refills | Status: AC

## 2024-02-23 NOTE — Telephone Encounter (Signed)
 Refills have been sent for the ones I seen that needed refills.

## 2024-02-23 NOTE — Telephone Encounter (Signed)
 Copied from CRM [id number]. Topic: Clinical - Prescription Issue >> Feb 23, 2024  9:54 AM Thersia BROCKS wrote: Reason for CRM: Patient called in stating he needs his 3 everyday medication refill, he did not know the names just needed them refilled

## 2024-02-28 ENCOUNTER — Telehealth: Payer: Self-pay | Admitting: Internal Medicine

## 2024-02-28 NOTE — Telephone Encounter (Unsigned)
 Copied from CRM [id number]. Topic: Clinical - Medication Refill >> Feb 28, 2024  3:28 PM Burnard DEL wrote: Medication: rosuvastatin  (CRESTOR ) 10 MG tablet  Has the patient contacted their pharmacy? Yes (Agent: If no, request that the patient contact the pharmacy for the refill. If patient does not wish to contact the pharmacy document the reason why and proceed with request.) (Agent: If yes, when and what did the pharmacy advise?)  This is the patient's preferred pharmacy:  [facility] [address] ([address]), [place] - [address] [place] ([place]) [place] [address] Phone: [phone] Fax: [phone]    Is this the correct pharmacy for this prescription? Yes If no, delete pharmacy and type the correct one.   Has the prescription been filled recently? Yes  Is the patient out of the medication? Yes  Has the patient been seen for an appointment in the last year OR does the patient have an upcoming appointment? Yes  Can we respond through MyChart? Yes  Agent: Please be advised that Rx refills may take up to 3 business days. We ask that you follow-up with your pharmacy.

## 2024-02-29 ENCOUNTER — Other Ambulatory Visit: Payer: Self-pay | Admitting: Internal Medicine

## 2024-02-29 ENCOUNTER — Other Ambulatory Visit: Payer: Self-pay

## 2024-02-29 MED ORDER — ROSUVASTATIN CALCIUM 10 MG PO TABS: 10.0000 mg | ORAL_TABLET | Freq: Every day | ORAL | 0 refills | Status: DC

## 2024-02-29 NOTE — Telephone Encounter (Signed)
 Copied from CRM [id number]. Topic: Clinical - Medication Refill >> Feb 29, 2024  1:25 PM [name] wrote: Medication:  traMADol  (ULTRAM ) 50 MG tablet    Has the patient contacted their pharmacy? Yes (Agent: If no, request that the patient contact the pharmacy for the refill. If patient does not wish to contact the pharmacy document the reason why and proceed with request.) (Agent: If yes, when and what did the pharmacy advise?)  This is the patient's preferred pharmacy:  [facility] [address] ([address]), [place] - [address] [place] ([place]) [place] [address] Phone: [phone] Fax: [phone]  Is this the correct pharmacy for this prescription? Yes If no, delete pharmacy and type the correct one.   Has the prescription been filled recently? No  Is the patient out of the medication? Yes  Has the patient been seen for an appointment in the last year OR does the patient have an upcoming appointment? Yes  Can we respond through MyChart? Yes  Agent: Please be advised that Rx refills may take up to 3 business days. We ask that you follow-up with your pharmacy.

## 2024-03-04 MED ORDER — TRAMADOL HCL 50 MG PO TABS: 50.0000 mg | ORAL_TABLET | Freq: Four times a day (QID) | ORAL | 2 refills | Status: DC | PRN

## 2024-04-17 ENCOUNTER — Other Ambulatory Visit: Payer: Self-pay | Admitting: Internal Medicine

## 2024-04-23 ENCOUNTER — Ambulatory Visit: Payer: Self-pay

## 2024-04-23 ENCOUNTER — Ambulatory Visit: Admitting: Internal Medicine

## 2024-04-23 NOTE — Telephone Encounter (Signed)
 FYI Only or Action Required?: FYI only for provider.  Patient was last seen in primary care on 01/30/2024 by Norleen Lynwood ORN, MD.  Called Nurse Triage reporting Knee Pain.  Symptoms began several weeks ago.  Interventions attempted: OTC medications: ibuprofen, Prescription medications: gabapentin , and Ice/heat application.  Symptoms are: gradually worsening.  Triage Disposition: See PCP When Office is Open (Within 3 Days)  Patient/caregiver understands and will follow disposition?: Yes      Copied from CRM #[id number]. Topic: Clinical - Red Word Triage >> Apr 23, 2024  8:44 AM Zy'onna H wrote: Red Word that prompted transfer to Nurse Triage:   Behind his Knees/Calves are causing pain and within his lower back Soreness in both but Right Knee not as bad.  Has a brace on but will not stay due to the size. Has been icing and compression socks  Swelling - began 2 weeks ago and still persistent Lower Left Knee - has small rash (red spots 'dime sized')  Warm Transferring NT Reason for Disposition  MILD or MODERATE swelling (e.g., can't move joint normally, can't do usual activities) (Exceptions: Itchy, localized swelling; swelling is chronic.)  Answer Assessment - Initial Assessment Questions 1. LOCATION: Where is the swelling located?  (e.g., left, right, both knees)     Left knee in the back and around to the front, right leg is just the back of the knee 2. ONSET: When did the swelling start? Does it come and go, or is it there all the time?     2 weeks  3. SWELLING: How bad is the swelling? Or, How large is it? (e.g., mild, moderate, severe; size of localized swelling)      Mild-mod 4. PAIN: Is there any pain? If Yes, ask: How bad is it? (Scale 0-10; or none, mild, moderate, severe)     5-6/10 5. SETTING: Has there been any recent work, exercise or other activity that involved that part of the body?      no 6. AGGRAVATING FACTORS: What makes the knee swelling  worse? (e.g., walking, climbing stairs, running)  walking    7. ASSOCIATED SYMPTOMS: Is there any pain or redness?     Pain and redness to left knee to inside 8. OTHER SYMPTOMS: Do you have any other symptoms? (e.g., calf pain, chest pain, difficulty breathing, fever)     Had a red rash but has since gone away  Protocols used: Knee Swelling-A-AH

## 2024-04-30 ENCOUNTER — Other Ambulatory Visit: Payer: Self-pay | Admitting: Internal Medicine

## 2024-05-06 ENCOUNTER — Ambulatory Visit: Payer: Self-pay | Admitting: Internal Medicine

## 2024-05-06 ENCOUNTER — Ambulatory Visit (INDEPENDENT_AMBULATORY_CARE_PROVIDER_SITE_OTHER): Admitting: Internal Medicine

## 2024-05-06 ENCOUNTER — Encounter: Payer: Self-pay | Admitting: Internal Medicine

## 2024-05-06 ENCOUNTER — Ambulatory Visit (HOSPITAL_COMMUNITY)
Admission: RE | Admit: 2024-05-06 | Discharge: 2024-05-06 | Disposition: A | Discharge status: Home / Self Care | Source: Ambulatory Visit | Attending: Internal Medicine | Admitting: Internal Medicine

## 2024-05-06 VITALS — BP 144/82 | HR 92 | Temp 98.2°F | Ht 68.0 in | Wt 296.2 lb

## 2024-05-06 DIAGNOSIS — M545 Low back pain, unspecified: Secondary | ICD-10-CM | POA: Diagnosis not present

## 2024-05-06 DIAGNOSIS — E1165 Type 2 diabetes mellitus with hyperglycemia: Secondary | ICD-10-CM

## 2024-05-06 DIAGNOSIS — E559 Vitamin D deficiency, unspecified: Secondary | ICD-10-CM

## 2024-05-06 DIAGNOSIS — N529 Male erectile dysfunction, unspecified: Secondary | ICD-10-CM | POA: Diagnosis not present

## 2024-05-06 DIAGNOSIS — M7989 Other specified soft tissue disorders: Secondary | ICD-10-CM | POA: Diagnosis not present

## 2024-05-06 DIAGNOSIS — B351 Tinea unguium: Secondary | ICD-10-CM | POA: Diagnosis not present

## 2024-05-06 DIAGNOSIS — Z7985 Long-term (current) use of injectable non-insulin antidiabetic drugs: Secondary | ICD-10-CM | POA: Diagnosis not present

## 2024-05-06 DIAGNOSIS — M79662 Pain in left lower leg: Secondary | ICD-10-CM

## 2024-05-06 DIAGNOSIS — M25562 Pain in left knee: Secondary | ICD-10-CM

## 2024-05-06 LAB — HEPATIC FUNCTION PANEL
ALT: 27 U/L (ref 0–53)
AST: 28 U/L (ref 0–37)
Albumin: 4 g/dL (ref 3.5–5.2)
Alkaline Phosphatase: 65 U/L (ref 39–117)
Bilirubin, Direct: 0.1 mg/dL (ref 0.0–0.3)
Total Bilirubin: 0.5 mg/dL (ref 0.2–1.2)
Total Protein: 8 g/dL (ref 6.0–8.3)

## 2024-05-06 LAB — LIPID PANEL
Cholesterol: 141 mg/dL (ref 0–200)
HDL: 46.4 mg/dL (ref >39.00)
LDL Cholesterol: 70 mg/dL (ref 0–99)
NonHDL: 95.01
Total CHOL/HDL Ratio: 3
Triglycerides: 127 mg/dL (ref 0.0–149.0)
VLDL: 25.4 mg/dL (ref 0.0–40.0)

## 2024-05-06 LAB — BASIC METABOLIC PANEL WITH GFR
BUN: 24 mg/dL — ABNORMAL HIGH (ref 6–23)
CO2: 29 meq/L (ref 19–32)
Calcium: 9.9 mg/dL (ref 8.4–10.5)
Chloride: 96 meq/L (ref 96–112)
Creatinine, Ser: 1.27 mg/dL (ref 0.40–1.50)
GFR: 57.87 mL/min — ABNORMAL LOW (ref >60.00)
Glucose, Bld: 156 mg/dL — ABNORMAL HIGH (ref 70–99)
Potassium: 3.5 meq/L (ref 3.5–5.1)
Sodium: 135 meq/L (ref 135–145)

## 2024-05-06 LAB — HEMOGLOBIN A1C: Hgb A1c MFr Bld: 7.1 % — ABNORMAL HIGH (ref 4.6–6.5)

## 2024-05-06 LAB — VITAMIN D 25 HYDROXY (VIT D DEFICIENCY, FRACTURES): VITD: 75.24 ng/mL (ref 30.00–100.00)

## 2024-05-06 MED ORDER — CICLOPIROX 8 % EX SOLN: Freq: Every day | CUTANEOUS | 11 refills | Status: AC

## 2024-05-06 MED ORDER — TIRZEPATIDE 2.5 MG/0.5ML ~~LOC~~ SOAJ: 2.5000 mg | SUBCUTANEOUS | 11 refills | Status: AC

## 2024-05-06 MED ORDER — TADALAFIL 20 MG PO TABS: 10.0000 mg | ORAL_TABLET | ORAL | 11 refills | Status: AC | PRN

## 2024-05-06 NOTE — Progress Notes (Unsigned)
 Patient ID: Michael Khan, male   DOB: 11/27/1955, 68 y.o.   MRN: [id number]        Chief Complaint: follow up left leg pain and swelling, lbp and left knee arthritis, onychomycosis fingernails, ED, DM       HPI:  Michael Khan is a 68 y.o. male here with c/o acute onset left leg pain and swelling but also left knee pain and swelling after mowing the yard.  Pt continues to have recurring LBP without change in severity, bowel or bladder change, fever, wt loss,  worsening LE pain/numbness/weakness, gait change or falls.  Also has onychomycosis to fingernails worsening, Has worsening ED x 6 mo.  Pt remains morbid obese but unable to lose significant wt.  CBGs has been mild increased       Wt Readings from Last 3 Encounters:  05/06/24 296 lb 4 oz (134.4 kg)  01/30/24 295 lb (133.8 kg)  12/21/23 (!) 302 lb (137 kg)   BP Readings from Last 3 Encounters:  05/06/24 (!) 144/82  01/30/24 136/78  12/21/23 120/68         Past Medical History:  Diagnosis Date   Guillain Barr syndrome [facility])    When he was 59, he received a flu shot caused him to develop Guillain Barre Syndrome   Hyperlipemia    Hypothyroidism 07/24/2019   Measles    Past Surgical History:  Procedure Laterality Date   ANKLE RECONSTRUCTION     CHOLECYSTECTOMY     NOSE SURGERY     WRIST RECONSTRUCTION      reports that he has never smoked. He has never used smokeless tobacco. He reports that he does not drink alcohol and does not use drugs. family history includes Healthy in his father; Liver cancer in his mother. Allergies  Allergen Reactions   Drug Class [Haemophilus Influenzae Vaccines] Other (See Comments)    Caused to not walk   Lipitor [Atorvastatin  Calcium ]     discomfort   Current Outpatient Medications on File Prior to Visit  Medication Sig Dispense Refill   albuterol  (VENTOLIN  HFA) 108 (90 Base) MCG/ACT inhaler INHALE 2 PUFFS BY MOUTH EVERY 6 HOURS AS NEEDED FOR WHEEZING FOR SHORTNESS OF BREATH 8 g 11    aspirin  81 MG EC tablet Take 1 tablet (81 mg total) by mouth daily. Swallow whole. 30 tablet 12   budesonide -formoterol  (SYMBICORT ) 160-4.5 MCG/ACT inhaler Inhale 1 puff into the lungs 2 (two) times daily. 1 each 11   cetirizine  (ZYRTEC ) 10 MG tablet Take 1 tablet by mouth once daily 30 tablet 11   Cholecalciferol 50 MCG (2000 UT) CAPS Take by mouth.     furosemide  (LASIX ) 20 MG tablet Take 1 tablet (20 mg total) by mouth daily as needed. 30 tablet 11   gabapentin  (NEURONTIN ) 300 MG capsule TAKE 1 CAPSULE BY MOUTH THREE TIMES DAILY 90 capsule 3   gentamicin  cream (GARAMYCIN ) 0.1 % Apply 1 application topically 2 (two) times daily. 30 g 1   levothyroxine  (SYNTHROID ) 75 MCG tablet Take 1 tablet by mouth once daily 90 tablet 3   naproxen  (NAPROSYN ) 500 MG tablet Take 1 tablet (500 mg total) by mouth 2 (two) times daily as needed for moderate pain. 60 tablet 2   nystatin  (MYCOSTATIN ) 100000 UNIT/ML suspension Take 5 mLs (500,000 Units total) by mouth 4 (four) times daily. 60 mL 0   pantoprazole  (PROTONIX ) 40 MG tablet Take 1 tablet (40 mg total) by mouth daily. 90 tablet 3   phentermine   37.5 MG capsule Take 1 capsule by mouth in the morning 90 capsule 1   predniSONE  (DELTASONE ) 10 MG tablet 3 tabs by mouth per day for 3 days,2tabs per day for 3 days,1tab per day for 3 days 18 tablet 0   rosuvastatin  (CRESTOR ) 10 MG tablet Take 1 tablet (10 mg total) by mouth daily. 90 tablet 0   sildenafil  (VIAGRA ) 100 MG tablet Take 0.5-1 tablets (50-100 mg total) by mouth daily as needed for erectile dysfunction. 5 tablet 11   solifenacin  (VESICARE ) 5 MG tablet Take 1 tablet by mouth once daily 90 tablet 3   traMADol  (ULTRAM ) 50 MG tablet Take 1 tablet (50 mg total) by mouth every 6 (six) hours as needed. 60 tablet 2   triamcinolone  (NASACORT ) 55 MCG/ACT AERO nasal inhaler Place 2 sprays into the nose daily. 1 each 12   triamterene -hydrochlorothiazide (MAXZIDE) 75-50 MG tablet Take 1 tablet by mouth once daily 90  tablet 0   doxycycline  (VIBRA -TABS) 100 MG tablet Take 1 tablet (100 mg total) by mouth 2 (two) times daily. 20 tablet 0   No current facility-administered medications on file prior to visit.        ROS:  All others reviewed and negative.  Objective        PE:  BP (!) 144/82   Pulse 92   Temp 98.2 F (36.8 C) (Temporal)   Ht 5' 8 (1.727 m)   Wt 296 lb 4 oz (134.4 kg)   SpO2 97%   BMI 45.04 kg/m                 Constitutional: Pt appears in NAD               HENT: Head: NCAT.                Right Ear: External ear normal.                 Left Ear: External ear normal.                Eyes: . Pupils are equal, round, and reactive to light. Conjunctivae and EOM are normal               Nose: without d/c or deformity               Neck: Neck supple. Gross normal ROM               Cardiovascular: Normal rate and regular rhythm.                 Pulmonary/Chest: Effort normal and breath sounds without rales or wheezing.                Abd:  Soft, NT, ND, + BS, no organomegaly               Neurological: Pt is alert. At baseline orientation, motor grossly intact               Skin: Skin is warm. No rashes, no other new lesions, LE edema - LLE with 2-3+ edema and diffuse calf tender, also left knee with mild effusion and decreased ROM               Psychiatric: Pt behavior is normal without agitation   Micro: none  Cardiac tracings I have personally interpreted today:  none  Pertinent Radiological findings (summarize): none   Lab Results  Component Value Date   WBC  8.9 10/04/2023   HGB 14.0 10/04/2023   HCT 41.9 10/04/2023   PLT 278.0 10/04/2023   GLUCOSE 156 (H) 05/06/2024   CHOL 141 05/06/2024   TRIG 127.0 05/06/2024   HDL 46.40 05/06/2024   LDLDIRECT 79.0 10/05/2021   LDLCALC 70 05/06/2024   ALT 27 05/06/2024   AST 28 05/06/2024   NA 135 05/06/2024   K 3.5 05/06/2024   CL 96 05/06/2024   CREATININE 1.27 05/06/2024   BUN 24 (H) 05/06/2024   CO2 29 05/06/2024   TSH  4.69 10/04/2023   PSA 0.36 10/04/2023   INR 1.0 10/03/2008   HGBA1C 7.1 (H) 05/06/2024   Assessment/Plan:  Michael Khan is a 68 y.o. Black or African American [2] male with  has a past medical history of Guillain Barr syndrome (HCC), Hyperlipemia, Hypothyroidism (07/24/2019), and Measles.  Erectile dysfunction Mild to mod, for cialis  20 mg prn,  to f/u any worsening symptoms or concerns  Knee pain, left C/w djd with effusion - refer sports medicine  Low back pain Chronic persistent > 6 wk and worsening -for LS spine mri  Onychomycosis Ok for penlac  asd,  to f/u any worsening symptoms or concerns   Pain and swelling of left lower leg Can't r/o dvt - for venous doppler  Type 2 diabetes mellitus with hyperglycemia, without long-term current use of insulin (HCC) Lab Results  Component Value Date   HGBA1C 7.1 (H) 05/06/2024   uncontrolled, pt to start mounjaro  2.5 mg weekly with intent to titrate   Vitamin D  deficiency Last vitamin D  Lab Results  Component Value Date   VD25OH 75.24 05/06/2024   Stable, cont oral replacement  Followup: Return in about 6 months (around 11/03/2024).  Lynwood Rush, MD 05/10/2024 8:54 PM [facility]

## 2024-05-06 NOTE — Progress Notes (Signed)
 The test results show that your current treatment is OK, as the tests are stable.  Please continue the same plan.  There is no other need for change of treatment or further evaluation based on these results, at this time.  thanks

## 2024-05-06 NOTE — Patient Instructions (Addendum)
 You will be contacted regarding the referral for: Left leg venous doppler to check for blood clot  Please take all new medication as prescribed - the Penlac  for the nail fungus  Please take all new medication as prescribed - the Mounjaro  2.5 mg weekly - and call in 1 month if you are doing ok, so that we can increase the dose  Please take all new medication as prescribed - the cialis  as needed  Please continue all other medications as before, and refills have been done if requested.  Please have the pharmacy call with any other refills you may need.  Please continue your efforts at being more active, low cholesterol diet, and weight control.  Please keep your appointments with your specialists as you may have planned  You will be contacted regarding the referral for: Sports Medicine for left knee and lower back symptoms  Please go to the LAB at the blood drawing area for the tests to be done  You will be contacted by phone if any changes need to be made immediately.  Otherwise, you will receive a letter about your results with an explanation, but please check with MyChart first.  Please make an Appointment to return in 6 months, or sooner if needed

## 2024-05-07 ENCOUNTER — Encounter: Payer: Self-pay | Admitting: Internal Medicine

## 2024-05-08 ENCOUNTER — Other Ambulatory Visit (HOSPITAL_COMMUNITY): Payer: Self-pay

## 2024-05-08 ENCOUNTER — Telehealth: Payer: Self-pay

## 2024-05-08 NOTE — Telephone Encounter (Signed)
 Pharmacy Patient Advocate Encounter   Received notification from CoverMyMeds that prior authorization for Mounjaro  2.5mg /0.43ml is required/requested.   Insurance verification completed.   The patient is insured through [facility] .   Per test claim: PA required; PA submitted to above mentioned insurance via Latent Key/confirmation #/EOC [id number] Status is pending

## 2024-05-10 ENCOUNTER — Encounter: Payer: Self-pay | Admitting: Internal Medicine

## 2024-05-10 ENCOUNTER — Other Ambulatory Visit (HOSPITAL_COMMUNITY): Payer: Self-pay

## 2024-05-10 DIAGNOSIS — M79662 Pain in left lower leg: Secondary | ICD-10-CM | POA: Insufficient documentation

## 2024-05-10 DIAGNOSIS — E1165 Type 2 diabetes mellitus with hyperglycemia: Secondary | ICD-10-CM | POA: Insufficient documentation

## 2024-05-10 DIAGNOSIS — M25562 Pain in left knee: Secondary | ICD-10-CM | POA: Insufficient documentation

## 2024-05-10 DIAGNOSIS — B351 Tinea unguium: Secondary | ICD-10-CM | POA: Insufficient documentation

## 2024-05-10 NOTE — Telephone Encounter (Signed)
 Pharmacy Patient Advocate Encounter  Received notification from [employer] that Prior Authorization for Mounjaro  2.5mg /0.54ml has been APPROVED from 05/10/24 to 08/28/24   PA #/Case ID/Reference #: [id number]

## 2024-05-10 NOTE — Telephone Encounter (Signed)
 Pharmacy Patient Advocate Encounter  Received notification from [employer] that Prior Authorization for Mounjaro  2.5mg /0.24ml has been APPROVED from 05/09/24 to 05/08/25. Ran test claim, Copay is $35. This test claim was processed through [facility]- copay amounts may vary at other pharmacies due to pharmacy/plan contracts, or as the patient moves through the different stages of their insurance plan.   PA #/Case ID/Reference #: [id number]

## 2024-05-10 NOTE — Assessment & Plan Note (Signed)
 C/w djd with effusion - refer sports medicine

## 2024-05-10 NOTE — Assessment & Plan Note (Signed)
 Lab Results  Component Value Date   HGBA1C 7.1 (H) 05/06/2024   uncontrolled, pt to start mounjaro  2.5 mg weekly with intent to titrate

## 2024-05-10 NOTE — Assessment & Plan Note (Signed)
 Last vitamin D  Lab Results  Component Value Date   VD25OH 75.24 05/06/2024   Stable, cont oral replacement

## 2024-05-10 NOTE — Assessment & Plan Note (Signed)
 Ok for penlac  asd,  to f/u any worsening symptoms or concerns

## 2024-05-10 NOTE — Telephone Encounter (Signed)
 Pharmacy Patient Advocate Encounter   Received notification from Pt Calls Messages that prior authorization for Mounjaro  2.5mg /0.20ml is required/requested.   Insurance verification completed.   The patient is insured through [employer] .   Per test claim: PA required; PA started via CoverMyMeds. KEY [id number] . Waiting for clinical questions to populate.

## 2024-05-10 NOTE — Assessment & Plan Note (Signed)
Cant r/o dvt - for venous doppler 

## 2024-05-10 NOTE — Assessment & Plan Note (Signed)
 Chronic persistent > 6 wk and worsening -for LS spine mri

## 2024-05-10 NOTE — Assessment & Plan Note (Signed)
 Mild to mod, for cialis 20 mg prn,  to f/u any worsening symptoms or concerns

## 2024-05-16 NOTE — Telephone Encounter (Signed)
 Called and spoke with patient, informing him of approval for this med. Patient expressed understanding

## 2024-05-17 ENCOUNTER — Ambulatory Visit
Admission: RE | Admit: 2024-05-17 | Discharge: 2024-05-17 | Disposition: A | Discharge status: Home / Self Care | Source: Ambulatory Visit | Attending: Internal Medicine | Admitting: Internal Medicine

## 2024-05-17 DIAGNOSIS — M545 Low back pain, unspecified: Secondary | ICD-10-CM

## 2024-05-17 DIAGNOSIS — M5126 Other intervertebral disc displacement, lumbar region: Secondary | ICD-10-CM | POA: Diagnosis not present

## 2024-05-17 DIAGNOSIS — M47816 Spondylosis without myelopathy or radiculopathy, lumbar region: Secondary | ICD-10-CM | POA: Diagnosis not present

## 2024-05-30 NOTE — Progress Notes (Unsigned)
   Michael Ileana Collet, PhD, LAT, ATC acting as a scribe for Artist Lloyd, MD.  Michael Khan is a 68 y.o. male who presents to [facility] at [facility] today for low back and L LE pain. Pt was last seen for his back pain on 10/08/21  Today, pt reports ***. Pt locates pain to ***  Radiating pain: LE numbness/tingling: LE weakness: Aggravates: Treatments tried:  Dx imaging: 05/17/24 L-spine MRI 10/05/21 R hip & L-spine XR   Pertinent review of systems: ***  Relevant historical information: ***   Exam:  There were no vitals taken for this visit. General: Well Developed, well nourished, and in no acute distress.   MSK: ***    Lab and Radiology Results No results found for this or any previous visit (from the past 72 hours). No results found.     Assessment and Plan: 68 y.o. male with ***   PDMP not reviewed this encounter. No orders of the defined types were placed in this encounter.  No orders of the defined types were placed in this encounter.    Discussed warning signs or symptoms. Please see discharge instructions. Patient expresses understanding.   ***

## 2024-05-31 ENCOUNTER — Other Ambulatory Visit: Payer: Self-pay

## 2024-05-31 ENCOUNTER — Ambulatory Visit (INDEPENDENT_AMBULATORY_CARE_PROVIDER_SITE_OTHER): Admitting: Family Medicine

## 2024-05-31 ENCOUNTER — Encounter: Payer: Self-pay | Admitting: Family Medicine

## 2024-05-31 VITALS — BP 162/78 | HR 92 | Ht 68.0 in | Wt 290.0 lb

## 2024-05-31 DIAGNOSIS — G8929 Other chronic pain: Secondary | ICD-10-CM

## 2024-05-31 DIAGNOSIS — M7122 Synovial cyst of popliteal space [Baker], left knee: Secondary | ICD-10-CM | POA: Diagnosis not present

## 2024-05-31 DIAGNOSIS — M25562 Pain in left knee: Secondary | ICD-10-CM | POA: Diagnosis not present

## 2024-05-31 NOTE — Patient Instructions (Addendum)
 Thank you for coming in today.   Call or go to the ER if you develop a large red swollen joint with extreme pain or oozing puss.    Try adding compression sleeve.   Please use Voltaren gel (Generic Diclofenac Gel) up to 4x daily for pain as needed.  This is available over-the-counter as both the name brand Voltaren gel and the generic diclofenac gel.

## 2024-06-21 ENCOUNTER — Other Ambulatory Visit: Payer: Self-pay

## 2024-06-21 ENCOUNTER — Other Ambulatory Visit: Payer: Self-pay | Admitting: Internal Medicine

## 2024-07-01 ENCOUNTER — Encounter: Payer: Self-pay | Admitting: Radiology

## 2024-07-03 ENCOUNTER — Ambulatory Visit: Admitting: Family Medicine

## 2024-07-03 ENCOUNTER — Ambulatory Visit (INDEPENDENT_AMBULATORY_CARE_PROVIDER_SITE_OTHER)

## 2024-07-03 ENCOUNTER — Other Ambulatory Visit: Payer: Self-pay

## 2024-07-03 VITALS — BP 162/78 | HR 87 | Ht 68.0 in

## 2024-07-03 DIAGNOSIS — G8929 Other chronic pain: Secondary | ICD-10-CM

## 2024-07-03 DIAGNOSIS — M25562 Pain in left knee: Secondary | ICD-10-CM

## 2024-07-03 DIAGNOSIS — M1712 Unilateral primary osteoarthritis, left knee: Secondary | ICD-10-CM | POA: Diagnosis not present

## 2024-07-03 NOTE — Patient Instructions (Signed)
 Thank you for coming in today.   You received an injection today. Seek immediate medical attention if the joint becomes red, extremely painful, or is oozing fluid.   Recommend using compression socks and a sock assist device.   See you back as needed.

## 2024-07-03 NOTE — Progress Notes (Signed)
 [name], PhD, LAT, ATC acting as a scribe for Artist Lloyd, MD.  TOBENNA NEEDS is a 68 y.o. male who presents to [facility] at [facility] today for cont'd L knee pain. Pt was last seen by Dr. Lloyd on 05/31/24 and the Baker's cyst in his L knee was aspirated and injected.  Today, pt reports L knee pain progressively worsened over the last 2-wks. Pain is locates in the along the anterior-medial aspect of the L knee and anterior lower leg. +swelling. Pain is interrupting his sleep at night  Dx testing: 05/06/24 L LE vasc US   Pertinent review of systems: No fevers or chills  Relevant historical information: Diabetes.  History left knee Baker's cyst.   Exam:  BP (!) 162/78   Pulse 87   Ht 5' 8 (1.727 m)   SpO2 99%   BMI 44.09 kg/m  General: Well Developed, well nourished, and in no acute distress.   MSK: Left knee mild effusion normal-appearing otherwise.  Tender palpation medial joint line. Minimal fullness posterior medial knee. Intact range of motion.  Some laxity with MCL stress test.    Lab and Radiology Results  Procedure: Real-time Ultrasound Guided Injection of left knee joint superior lateral patella space Device: Philips Affiniti 50G/GE Logiq Ultrasound evaluation prior to injection reveals minimal effusion.  Degeneration medial joint line and minimal Baker's cyst. Images permanently stored and available for review in PACS Verbal informed consent obtained.  Discussed risks and benefits of procedure. Warned about infection, bleeding, hyperglycemia damage to structures among others. Patient expresses understanding and agreement Time-out conducted.   Noted no overlying erythema, induration, or other signs of local infection.   Skin prepped in a sterile fashion.   Local anesthesia: Topical Ethyl chloride.   With sterile technique and under real time ultrasound guidance: 40 mg of Kenalog  and 2 mL of Marcaine  injected into knee joint. Fluid seen  entering the joint capsule.   Completed without difficulty   Pain immediately resolved suggesting accurate placement of the medication.   Advised to call if fevers/chills, erythema, induration, drainage, or persistent bleeding.   Images permanently stored and available for review in the ultrasound unit.  Impression: Technically successful ultrasound guided injection.   X-ray images left knee obtained today personally and independently interpreted. Mild to moderate medial DJD. Old osteophyte distal patellar tendon insertion on the tibial tubercle secondary to old Osgood-Schlatter's Await formal radiology review    Assessment and Plan: 68 y.o. male with left knee pain.  This occurs in the setting of a Baker's cyst.  1 month ago he had aspiration and injection of Baker's cyst.  Today he has more generalized knee pain more on the medial joint line to anterior knee.  Plan for intra-articular knee injection and x-ray and continued compression devices knee and bilateral lower extremities.  Check back if not improving.   PDMP not reviewed this encounter. Orders Placed This Encounter  Procedures   US  LIMITED JOINT SPACE STRUCTURES LOW LEFT(NO LINKED CHARGES)    Reason for Exam (SYMPTOM  OR DIAGNOSIS REQUIRED):   left knee pain    Preferred imaging location?:   [facility] Knee AP/LAT W/[place] Left    Standing Status:   Future    Expiration Date:   08/02/2024    Reason for Exam (SYMPTOM  OR DIAGNOSIS REQUIRED):   left knee pain    Preferred imaging location?:   [facility]   No orders of  the defined types were placed in this encounter.    Discussed warning signs or symptoms. Please see discharge instructions. Patient expresses understanding.   The above documentation has been reviewed and is accurate and complete Artist Lloyd, M.D.

## 2024-07-05 ENCOUNTER — Telehealth: Payer: Self-pay | Admitting: Family Medicine

## 2024-07-05 NOTE — Telephone Encounter (Signed)
 Pt called, had injection 11/5 but no relief from pain. Trying ice/heat.   Xray results not back yet, xray staff calling for expedited radiology read this morning.  Any other suggestions for pain relief?

## 2024-07-06 ENCOUNTER — Emergency Department (HOSPITAL_BASED_OUTPATIENT_CLINIC_OR_DEPARTMENT_OTHER)
Admission: EM | Admit: 2024-07-06 | Discharge: 2024-07-06 | Disposition: A | Discharge status: Home / Self Care | Attending: Emergency Medicine | Admitting: Emergency Medicine

## 2024-07-06 ENCOUNTER — Other Ambulatory Visit: Payer: Self-pay

## 2024-07-06 ENCOUNTER — Encounter (HOSPITAL_BASED_OUTPATIENT_CLINIC_OR_DEPARTMENT_OTHER): Payer: Self-pay | Admitting: Emergency Medicine

## 2024-07-06 DIAGNOSIS — Z7982 Long term (current) use of aspirin: Secondary | ICD-10-CM | POA: Insufficient documentation

## 2024-07-06 DIAGNOSIS — X58XXXA Exposure to other specified factors, initial encounter: Secondary | ICD-10-CM | POA: Diagnosis not present

## 2024-07-06 DIAGNOSIS — S8992XA Unspecified injury of left lower leg, initial encounter: Secondary | ICD-10-CM | POA: Diagnosis not present

## 2024-07-06 DIAGNOSIS — S80912A Unspecified superficial injury of left knee, initial encounter: Secondary | ICD-10-CM | POA: Diagnosis not present

## 2024-07-06 DIAGNOSIS — M25562 Pain in left knee: Secondary | ICD-10-CM | POA: Diagnosis present

## 2024-07-06 MED ORDER — ACETAMINOPHEN 500 MG PO TABS
1000.0000 mg | ORAL_TABLET | Freq: Once | ORAL | Status: AC
  Administered 2024-07-06: 1000 mg via ORAL
  Filled 2024-07-06: qty 2

## 2024-07-06 MED ORDER — KETOROLAC TROMETHAMINE 15 MG/ML IJ SOLN
15.0000 mg | Freq: Once | INTRAMUSCULAR | Status: AC
  Administered 2024-07-06: 15 mg via INTRAMUSCULAR
  Filled 2024-07-06: qty 1

## 2024-07-06 NOTE — ED Provider Notes (Signed)
 [name] EMERGENCY DEPARTMENT AT [facility] Provider Note   CSN: [id number] Arrival date & time: 07/06/24  1343     Patient presents with: Leg Pain   Michael Khan is a 68 y.o. male.   68 yo M with a chief complaints of left knee pain.  This been an ongoing issue for him.  He was seen by his family doctor and referred to sports medicine.  Has had an ultrasound as well as an x-ray.  Says it continues to hurt especially when he is up and bearing weight.  Denies trauma to the area.  Denies fevers.   Leg Pain      Prior to Admission medications   Medication Sig Start Date End Date Taking? Authorizing Provider  albuterol  (VENTOLIN  HFA) 108 (90 Base) MCG/ACT inhaler INHALE 2 PUFFS BY MOUTH EVERY 6 HOURS AS NEEDED FOR WHEEZING FOR SHORTNESS OF BREATH 02/12/24   Norleen Lynwood ORN, MD  aspirin  81 MG EC tablet Take 1 tablet (81 mg total) by mouth daily. Swallow whole. 11/08/17   Norleen Lynwood ORN, MD  budesonide -formoterol  (SYMBICORT ) 160-4.5 MCG/ACT inhaler Inhale 1 puff into the lungs 2 (two) times daily. 01/30/24   Norleen Lynwood ORN, MD  cetirizine  (ZYRTEC ) 10 MG tablet Take 1 tablet by mouth once daily 07/10/23   Norleen Lynwood ORN, MD  Cholecalciferol 50 MCG (2000 UT) CAPS Take by mouth.    [provider]  ciclopirox  (PENLAC ) 8 % solution Apply topically at bedtime. Apply over nail and surrounding skin. Apply daily over previous coat. After seven (7) days, may remove with alcohol and continue cycle. 05/06/24   Norleen Lynwood ORN, MD  furosemide  (LASIX ) 20 MG tablet Take 1 tablet (20 mg total) by mouth daily as needed. 02/23/24   Norleen Lynwood ORN, MD  gabapentin  (NEURONTIN ) 300 MG capsule TAKE 1 CAPSULE BY MOUTH THREE TIMES DAILY 06/21/24   Norleen Lynwood ORN, MD  gentamicin  cream (GARAMYCIN ) 0.1 % Apply 1 application topically 2 (two) times daily. 07/01/20   [name], DPM  levothyroxine  (SYNTHROID ) 75 MCG tablet Take 1 tablet by mouth once daily 12/13/23   Norleen Lynwood ORN, MD  naproxen  (NAPROSYN )  500 MG tablet Take 1 tablet (500 mg total) by mouth 2 (two) times daily as needed for moderate pain. 04/24/20   Norleen Lynwood ORN, MD  nystatin  (MYCOSTATIN ) 100000 UNIT/ML suspension Take 5 mLs (500,000 Units total) by mouth 4 (four) times daily. 10/23/23   Alvia Corean CROME, FNP  pantoprazole  (PROTONIX ) 40 MG tablet Take 1 tablet (40 mg total) by mouth daily. 02/23/24   Norleen Lynwood ORN, MD  phentermine  37.5 MG capsule Take 1 capsule by mouth in the morning 04/17/24   Norleen Lynwood ORN, MD  rosuvastatin  (CRESTOR ) 10 MG tablet Take 1 tablet (10 mg total) by mouth daily. 02/29/24   Norleen Lynwood ORN, MD  sildenafil  (VIAGRA ) 100 MG tablet Take 0.5-1 tablets (50-100 mg total) by mouth daily as needed for erectile dysfunction. 07/15/22   Norleen Lynwood ORN, MD  solifenacin  (VESICARE ) 5 MG tablet Take 1 tablet by mouth once daily 12/13/23   Norleen Lynwood ORN, MD  tadalafil  (CIALIS ) 20 MG tablet Take 0.5-1 tablets (10-20 mg total) by mouth every other day as needed for erectile dysfunction. 05/06/24   Norleen Lynwood ORN, MD  tirzepatide  (MOUNJARO ) 2.5 MG/0.5ML Pen Inject 2.5 mg into the skin once a week. 05/06/24   Norleen Lynwood ORN, MD  traMADol  (ULTRAM ) 50 MG tablet Take 1 tablet (50 mg total)  by mouth every 6 (six) hours as needed. 03/04/24   Norleen Lynwood ORN, MD  triamcinolone  (NASACORT ) 55 MCG/ACT AERO nasal inhaler Place 2 sprays into the nose daily. 07/15/22   Norleen Lynwood ORN, MD  triamterene -hydrochlorothiazide (MAXZIDE) 75-50 MG tablet Take 1 tablet by mouth once daily 04/30/24   Norleen Lynwood ORN, MD    Allergies: Drug class [haemophilus influenzae vaccines] and Lipitor [atorvastatin  calcium ]    Review of Systems  Updated Vital Signs BP 131/67   Pulse 96   Temp 98 F (36.7 C)   Resp 16   SpO2 97%   Physical Exam Vitals and nursing note reviewed.  Constitutional:      Appearance: He is well-developed.  HENT:     Head: Normocephalic and atraumatic.  Eyes:     Pupils: Pupils are equal, round, and reactive to light.  Neck:      Vascular: No JVD.  Cardiovascular:     Rate and Rhythm: Normal rate and regular rhythm.     Heart sounds: No murmur heard.    No friction rub. No gallop.  Pulmonary:     Effort: No respiratory distress.     Breath sounds: No wheezing.  Abdominal:     General: There is no distension.     Tenderness: There is no abdominal tenderness. There is no guarding or rebound.  Musculoskeletal:        General: Normal range of motion.     Cervical back: Normal range of motion and neck supple.     Comments: Tenderness with palpation about the medial aspect of the proximal tibia.  Some edema to the leg.  No obvious ligamentous laxity.  Pulse motor and sensation are intact distally.  Skin:    Coloration: Skin is not pale.     Findings: No rash.  Neurological:     Mental Status: He is alert and oriented to person, place, and time.  Psychiatric:        Behavior: Behavior normal.     (all labs ordered are listed, but only abnormal results are displayed) Labs Reviewed - No data to display  EKG: None  Radiology: No results found.   Procedures   Medications Ordered in the ED  ketorolac (TORADOL) 15 MG/ML injection 15 mg (15 mg Intramuscular Given 07/06/24 1617)  acetaminophen  (TYLENOL ) tablet 1,000 mg (1,000 mg Oral Given 07/06/24 1617)                                    Medical Decision Making Risk OTC drugs. Prescription drug management.   68 yo M with a chief complaint of left knee pain.  This been going on for a little while now.  He seen his family doctor and been referred to sports medicine.  Had a plain film done yesterday independently interpreted by me without fracture.  Likely arthritis by history and physical.  Will give a knee sleeve for comfort.  Trial of Tylenol  and NSAIDs.  PCP and sports medicine follow-up.  4:22 PM:  I have discussed the diagnosis/risks/treatment options with the patient and family.  Evaluation and diagnostic testing in the emergency department does not  suggest an emergent condition requiring admission or immediate intervention beyond what has been performed at this time.  They will follow up with PCP, sports med. We also discussed returning to the ED immediately if new or worsening sx occur. We discussed the sx which are most  concerning (e.g., sudden worsening pain, fever, inability to tolerate by mouth) that necessitate immediate return. Medications administered to the patient during their visit and any new prescriptions provided to the patient are listed below.  Medications given during this visit Medications  ketorolac (TORADOL) 15 MG/ML injection 15 mg (15 mg Intramuscular Given 07/06/24 1617)  acetaminophen  (TYLENOL ) tablet 1,000 mg (1,000 mg Oral Given 07/06/24 1617)     The patient appears reasonably screen and/or stabilized for discharge and I doubt any other medical condition or other [facility] requiring further screening, evaluation, or treatment in the ED at this time prior to discharge.       Final diagnoses:  Left knee injury, initial encounter    ED Discharge Orders     None          Emil Share, DO 07/06/24 1622

## 2024-07-06 NOTE — Discharge Instructions (Signed)
 Take 4 over the counter ibuprofen tablets 3 times a day or 2 over-the-counter naproxen  tablets twice a day for pain. Also take tylenol  1000mg (2 extra strength) four times a day.   Follow up with your sports medicine doc.

## 2024-07-06 NOTE — ED Triage Notes (Signed)
 Now having pain inner left knee  Xray done by sports medicine  Left leg pain Seen for bakers cyst last month Drained and improved

## 2024-07-06 NOTE — ED Notes (Signed)
 Reviewed AVS/discharge instructions with patient. Time allotted for and all questions answered. Patient is agreeable for d/c and escorted to ED exit by staff.

## 2024-07-08 ENCOUNTER — Ambulatory Visit: Payer: Self-pay | Admitting: Family Medicine

## 2024-07-08 NOTE — Telephone Encounter (Signed)
Called pt, VM full

## 2024-07-08 NOTE — Progress Notes (Signed)
Left knee x-ray shows medium arthritis.

## 2024-07-08 NOTE — Telephone Encounter (Signed)
 Forwarding to Dr. Joane to review and advise.   Per visit note 07/03/24:  Assessment and Plan: 68 y.o. male with left knee pain.  This occurs in the setting of a Baker's cyst.  1 month ago he had aspiration and injection of Baker's cyst.  Today he has more generalized knee pain more on the medial joint line to anterior knee.  Plan for intra-articular knee injection and x-ray and continued compression devices knee and bilateral lower extremities.  Check back if not improving.

## 2024-07-08 NOTE — Telephone Encounter (Signed)
 We have an appointment scheduled for the 12th.  Will look into this at that visit.

## 2024-07-09 ENCOUNTER — Telehealth: Payer: Self-pay

## 2024-07-09 NOTE — Telephone Encounter (Signed)
 Per results note:  Joane Artist RAMAN, MD to [facility] Sports Medicine Clinical (Selected Message)    07/08/24  6:51 AM Note     Left knee x-ray shows medium arthritis      DG Knee AP/LAT W/Sunrise Left   Per visit note 07/03/24:  Assessment and Plan: 68 y.o. male with left knee pain.  This occurs in the setting of a Baker's cyst.  1 month ago he had aspiration and injection of Baker's cyst.  Today he has more generalized knee pain more on the medial joint line to anterior knee.  Plan for intra-articular knee injection and x-ray and continued compression devices knee and bilateral lower extremities.  Check back if not improving.

## 2024-07-09 NOTE — Telephone Encounter (Signed)
 Pt scheduled for tomorrow, 07/10/24, will discuss at OV.

## 2024-07-09 NOTE — Telephone Encounter (Signed)
 Pt scheduled for f/u visit tomorrow. Would like to discuss need for MRI. Reports no improvement of sx with injection.  Forwarding to Dr. Joane as RICK.

## 2024-07-10 ENCOUNTER — Ambulatory Visit (INDEPENDENT_AMBULATORY_CARE_PROVIDER_SITE_OTHER): Admitting: Family Medicine

## 2024-07-10 VITALS — BP 118/60 | HR 98 | Ht 68.0 in

## 2024-07-10 DIAGNOSIS — G8929 Other chronic pain: Secondary | ICD-10-CM

## 2024-07-10 DIAGNOSIS — M25562 Pain in left knee: Secondary | ICD-10-CM | POA: Diagnosis not present

## 2024-07-10 DIAGNOSIS — M5416 Radiculopathy, lumbar region: Secondary | ICD-10-CM

## 2024-07-10 NOTE — Patient Instructions (Signed)
 Thank you for coming in today.   Please call [facility] ([facility]) at [phone] to schedule your spine injection.    Let me know if that shot doesn't work and I will order a knee MRI

## 2024-07-10 NOTE — Progress Notes (Signed)
 I, Claretha Schimke am a scribe for Dr. Artist Lloyd, MD.  Michael Khan is a 68 y.o. male who presents to [facility] at [facility] today for L leg pain. Pt was previously seen by Dr. Lloyd on 07/03/24 for his L knee and was given a steroid injection.  Today, pt reports left knee is hurting really bad. Recently went to the Emergency room because of the pain. They did not give him any medication for the pain but he did get a knee brace. Steroid injection last time didn't really seem to help. He is using voltaren gel and two other kinds of topicals for the pain. The leg itself hurts. It hurts from the knee to the ankle.   Dx testing: 07/03/24 L knee XR 05/17/24 L-spine MRI 05/06/24 L LE vasc US    Pertinent review of systems: No fevers or chills  Relevant historical information: Lumbar radiculopathy   Exam:  BP 118/60   Pulse 98   Ht 5' 8 (1.727 m)   SpO2 94%   BMI 44.09 kg/m  General: Well Developed, well nourished, and in no acute distress.   MSK: Left knee tender palpation medial joint line.  L-spine decreased lumbar motion lower extremity strength is intact.    Lab and Radiology Results   EXAM: MRI LUMBAR SPINE 05/17/2024 04:20:00 PM   TECHNIQUE: Multiplanar multisequence MRI of the lumbar spine was performed without the administration of intravenous contrast.   COMPARISON: Lumbar spine radiographs 10/05/2021.   CLINICAL HISTORY: Lumbar radiculopathy, symptoms persist with > 6 wks treatment. MRI Lumbar Spine wo; Pt states occasional lower back pain x 3 months.   FINDINGS:   BONES AND ALIGNMENT: Conventional lumbosacral anatomy is assumed with 5 non-rib-bearing, lumbar-type vertebral bodies. Normal vertebral body heights. Bone marrow signal is unremarkable.   SPINAL CORD: The conus terminates at L1.   SOFT TISSUES: Moderate fatty atrophy of the paraspinal muscles. Epidural lipomatosis throughout the mid lumbar spine.   L1-L2: Disc  bulge and right greater than left facet arthropathy contribute to mild spinal canal stenosis. No significant neural foraminal narrowing.   L2-L3: Disc bulge and right greater than left facet arthropathy contribute to mild spinal canal stenosis. No significant neural foraminal narrowing.   L3-L4: Left eccentric disc bulge and facet arthropathy result in moderate narrowing of the left lateral recess with displacement of the traversing left L4 nerve root and subarticular zone and moderate right neural foraminal narrowing.   L4-L5: Left eccentric disc bulge and facet arthropathy result in moderate narrowing of the left lateral recess with compression of the traversing left L5 nerve root in the subarticular zone. No significant neural foraminal narrowing.   L5-S1: Moderate bilateral facet arthropathy with periarticular edema. No significant disc herniation. No spinal canal stenosis or neural foraminal narrowing.   IMPRESSION: 1. Left lateral recess narrowing at L4-5 with compression of the traversing left L5 nerve root. 2. Left lateral recess narrowing at L3-4 with displacement of the traversing left L4 nerve root. 3. Moderate bilateral facet arthropathy with periarticular edema at L5-S1.   Electronically signed by: Ryan Chess MD 05/21/2024 03:42 PM EDT RP Workstation: [id number]  LILLETTE Artist Lloyd, personally (independently) visualized and performed the interpretation of the images attached in this note.     Assessment and Plan: 68 y.o. male with left leg and knee pain.  Etiology is difficult to determine.  He does have degenerative changes on left knee x-ray but has failed to improve with Baker's cyst aspiration and  injection and conventional intra-articular knee injection.  He also on MRI lumbar spine in September does have the potential for left L4 and L5 radiculopathy.  His current symptoms are pretty consistent with left L4.    Plan for trial of an epidural steroid injection.   If that does not work next up should be knee MRI.   PDMP not reviewed this encounter. 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   No orders of the defined types were placed in this encounter.    Discussed warning signs or symptoms. Please see discharge instructions. Patient expresses understanding.   The above documentation has been reviewed and is accurate and complete Artist Lloyd, M.D.

## 2024-07-19 ENCOUNTER — Other Ambulatory Visit: Payer: Self-pay | Admitting: Internal Medicine

## 2024-07-21 ENCOUNTER — Other Ambulatory Visit: Payer: Self-pay | Admitting: Internal Medicine

## 2024-07-22 ENCOUNTER — Telehealth: Payer: Self-pay

## 2024-07-22 ENCOUNTER — Other Ambulatory Visit: Payer: Self-pay

## 2024-07-22 NOTE — Telephone Encounter (Signed)
 Copied from CRM #[id number]. Topic: Clinical - Prescription Issue >> Jul 22, 2024  9:01 AM Michael Khan wrote: Reason for CRM: Patient calling to check on medications that [facility] said are pending ,he didn't have the names on him then because he was driving but will call back with them to see why they have been still pending and its been a week

## 2024-07-22 NOTE — Discharge Instructions (Signed)

## 2024-07-23 ENCOUNTER — Other Ambulatory Visit: Payer: Self-pay

## 2024-07-24 ENCOUNTER — Inpatient Hospital Stay
Admission: RE | Admit: 2024-07-24 | Discharge: 2024-07-24 | Disposition: A | Discharge status: Home / Self Care | Source: Ambulatory Visit | Attending: Family Medicine | Admitting: Family Medicine

## 2024-08-01 ENCOUNTER — Other Ambulatory Visit: Payer: Self-pay | Admitting: Internal Medicine

## 2024-08-01 NOTE — Discharge Instructions (Signed)

## 2024-08-02 ENCOUNTER — Other Ambulatory Visit: Payer: Self-pay

## 2024-08-02 ENCOUNTER — Ambulatory Visit
Admission: RE | Admit: 2024-08-02 | Discharge: 2024-08-02 | Disposition: A | Discharge status: Home / Self Care | Source: Ambulatory Visit | Attending: Family Medicine | Admitting: Family Medicine

## 2024-08-02 DIAGNOSIS — M5416 Radiculopathy, lumbar region: Secondary | ICD-10-CM

## 2024-08-02 MED ORDER — METHYLPREDNISOLONE ACETATE 40 MG/ML INJ SUSP (RADIOLOG
80.0000 mg | Freq: Once | INTRAMUSCULAR | Status: AC
  Administered 2024-08-02: 80 mg via EPIDURAL

## 2024-08-02 MED ORDER — IOPAMIDOL (ISOVUE-M 200) INJECTION 41%
1.0000 mL | Freq: Once | INTRAMUSCULAR | Status: AC
  Administered 2024-08-02: 1 mL via EPIDURAL

## 2024-08-19 ENCOUNTER — Telehealth: Payer: Self-pay | Admitting: Family Medicine

## 2024-08-19 DIAGNOSIS — R600 Localized edema: Secondary | ICD-10-CM

## 2024-08-19 NOTE — Telephone Encounter (Signed)
 Patient called and stated that he went for the imaging and epidural and is still having pain in his legs. Legs are very swollen and it feels like they are going to bust. He states he is taking his fluid pill and is going to the bathroom and he should not be retaining fluid he says. He does not know why his legs are swelling so much and when he bends his leg back it fells like it is very very tight. His left leg is tighter than the other. He would like to be seen and for someone to call him back. Please advise.

## 2024-08-19 NOTE — Telephone Encounter (Signed)
 Called pt at [phone], left VM to call the office.

## 2024-08-19 NOTE — Telephone Encounter (Signed)
 I am not confident the injection in your back is causing the leg swelling.  I do recommend contacting your primary care provider office and try to get an appointment.  If you would like we can arrange for an ultrasound to check for DVT.

## 2024-08-19 NOTE — Telephone Encounter (Signed)
 Forwarding to Dr. Joane to review and advise.   Pt has ESI 08/02/24.

## 2024-08-20 NOTE — Telephone Encounter (Signed)
 Already done in referral order. No pre cert required

## 2024-08-20 NOTE — Telephone Encounter (Signed)
 I agree, pt needs ROV

## 2024-08-20 NOTE — Telephone Encounter (Signed)
 Forwarding to PCP, Dr. Norleen, as FYI, as Dr. Joane recommends f/u with PCP.

## 2024-08-20 NOTE — Addendum Note (Signed)
 Addended by: MARDY LEOTIS RAMAN on: 08/20/2024 12:26 PM   Modules accepted: Orders

## 2024-08-20 NOTE — Telephone Encounter (Signed)
 Called and spoke with patient, advised per Dr. Joane. Pt verbalized understanding. Pt is agreeable to order for U/S to r/o DVT. He will reach out to Dr. Nicola office to schedule f/u.   Order placed.   [facility], please check insurance prior auth requirements.   Forwarding to Falecha as FYI.

## 2024-08-23 ENCOUNTER — Other Ambulatory Visit (HOSPITAL_COMMUNITY): Payer: Self-pay

## 2024-08-23 ENCOUNTER — Ambulatory Visit (HOSPITAL_COMMUNITY)
Admission: RE | Admit: 2024-08-23 | Discharge: 2024-08-23 | Disposition: A | Discharge status: Home / Self Care | Source: Ambulatory Visit | Attending: Family Medicine | Admitting: Family Medicine

## 2024-08-23 ENCOUNTER — Telehealth: Payer: Self-pay | Admitting: Pharmacy Technician

## 2024-08-23 DIAGNOSIS — R600 Localized edema: Secondary | ICD-10-CM | POA: Insufficient documentation

## 2024-08-23 NOTE — Telephone Encounter (Signed)
 Pharmacy Patient Advocate Encounter   Received notification from Onbase that prior authorization for Mounjaro  2.5MG /0.5ML auto-injectors is due for renewal.   Insurance verification completed.   The patient is insured through [employer].  Action: PA required; PA submitted to above mentioned insurance via Latent Key/confirmation #/EOC [id number] Status is pending

## 2024-08-23 NOTE — Telephone Encounter (Signed)
 Pharmacy Patient Advocate Encounter  Received notification from [employer] that Prior Authorization for Mounjaro  2.5MG /0.5ML auto-injectors has been APPROVED from 08/23/2024 to 08/28/2025.   PA #/Case ID/Reference #: [id number]

## 2024-08-28 ENCOUNTER — Other Ambulatory Visit: Payer: Self-pay | Admitting: Internal Medicine

## 2024-08-29 ENCOUNTER — Ambulatory Visit: Payer: Self-pay | Admitting: Family Medicine

## 2024-08-29 NOTE — Progress Notes (Signed)
 No blood clots are seen on vascular ultrasound.

## 2024-08-30 ENCOUNTER — Other Ambulatory Visit: Payer: Self-pay

## 2024-08-31 ENCOUNTER — Other Ambulatory Visit: Payer: Self-pay | Admitting: Internal Medicine

## 2024-09-04 NOTE — Telephone Encounter (Signed)
 Pt returned call after hours, please call.

## 2024-09-06 ENCOUNTER — Ambulatory Visit: Payer: Self-pay

## 2024-09-06 NOTE — Telephone Encounter (Signed)
 FYI Only or Action Required?: FYI only for provider: ED advised.  Patient was last seen in primary care on 05/06/2024 by Norleen Lynwood ORN, MD.  Called Nurse Triage reporting Chest Pain and Leg Swelling.  Symptoms began several days ago.  Interventions attempted: Rest, hydration, or home remedies.  Symptoms are: gradually worsening.  Triage Disposition: Go to ED Now (Notify PCP)  Patient/caregiver understands and will follow disposition?: Yes    Copied from CRM [id number]. Topic: Clinical - Red Word Triage >> Sep 06, 2024  2:43 PM Thersia BROCKS wrote: Red Word that prompted transfer to Nurse Triage: Patient has been having issues having swelling in his legs, also states he has been having chest pains on and off      Reason for Disposition  [1] Chest pain (or angina) comes and goes AND [2] is happening more often (increasing in frequency) or getting worse (increasing in severity)  (Exception: Chest pains that last only a few seconds.)    Hot flashes, recent leg swelling and heart rate changes.  Answer Assessment - Initial Assessment Questions 1. LOCATION: Where does it hurt?   Chest pain around left chest.   2. RADIATION: Does the pain go anywhere else? (e.g., into neck, jaw, arms, back)     Upward sometimes.   3. ONSET: When did the chest pain begin? (Minutes, hours or days)      7x over the last year, just starting up again this year  4. PATTERN: Does the pain come and go, or has it been constant since it started?  Does it get worse with exertion?      Intermittent leg swelling and chest pain.   5. DURATION: How long does it last (e.g., seconds, minutes, hours)     Second or so, sometimes rubs it.     6. SEVERITY: How bad is the pain?  (e.g., Scale 1-10; mild, moderate, or severe)     Sharp pain. Also having urinary frequency- further assessment deferred.   7. CARDIAC RISK FACTORS: Do you have any history of heart problems or risk factors for heart disease?  (e.g., angina, prior heart attack; diabetes, high blood pressure, high cholesterol, smoker, or strong family history of heart disease)     Denies heart problems; history of hypertension, hyperlipidemia noted  8. PULMONARY RISK FACTORS: Do you have any history of lung disease?  (e.g., blood clots in lung, asthma, emphysema, birth control pills)     Uses asthma inhaler, hasn't used in a long time.  No trouble breathing right now while applying compression socks.   9. CAUSE: What do you think is causing the chest pain?     Maybe it's indigestion, stomach upset today.   10. OTHER SYMPTOMS: Do you have any other symptoms? (e.g., dizziness, nausea, vomiting, sweating, fever, difficulty breathing, cough)       Leg swelling- none today.  Cotton mouth (dry) all the time again.   Active, with grandson.  Works at a  job.   Intentionally losing weight, taking medication (appetite reducer).  Heart rate changes yesterday, shaking in hands enhanced then, didn't last long.  Also having hot flashes/ sweats intermittent. History of diabetes noted, reports poor sleep  Protocols used: Chest Pain-A-AH

## 2024-09-09 ENCOUNTER — Telehealth: Payer: Self-pay | Admitting: Family Medicine

## 2024-09-09 ENCOUNTER — Ambulatory Visit: Payer: Self-pay

## 2024-09-09 DIAGNOSIS — G8929 Other chronic pain: Secondary | ICD-10-CM

## 2024-09-09 NOTE — Telephone Encounter (Signed)
 Forwarding to Dr. Joane to review and advise.   Per visit note 07/10/24:  Assessment and Plan: 69 y.o. male with left leg and knee pain.  Etiology is difficult to determine.  He does have degenerative changes on left knee x-ray but has failed to improve with Baker's cyst aspiration and injection and conventional intra-articular knee injection.  He also on MRI lumbar spine in September does have the potential for left L4 and L5 radiculopathy.  His current symptoms are pretty consistent with left L4.     Plan for trial of an epidural steroid injection.  If that does not work next up should be knee MRI.  Last MRI L-Spine 05/17/24  XR L Knee 07/03/24

## 2024-09-09 NOTE — Telephone Encounter (Signed)
 Patient called stating that he is still having left knee pain and would like to know what next steps would be?  Please advise.

## 2024-09-09 NOTE — Telephone Encounter (Signed)
 FYI Only or Action Required?: Action required by provider: request for appointment, clinical question for provider, and update on patient condition.  Patient was last seen in primary care on 05/06/2024 by Norleen Lynwood ORN, MD.  Called Nurse Triage reporting Chest Pain.  Symptoms began several days ago.  Interventions attempted: Rest, hydration, or home remedies.  Symptoms are: gradually worsening.  Triage Disposition: Call EMS 911 Now  Patient/caregiver understands and will follow disposition?: No, refuses disposition      Copied from CRM #[id number]. Topic: Clinical - Red Word Triage >> Sep 09, 2024  2:11 PM Kevelyn M wrote: Red Word that prompted transfer to Nurse Triage: Patient is experiencing hot flashes and chest pain. Hot flashes have intensified over the last two months. Reason for Disposition  Visible sweat on face or sweat dripping down face  Answer Assessment - Initial Assessment Questions This RN recommended pt be examined in hospital, pt refusing. Advised pt call 911 or get to hospital asap if any new or worsening symptoms. Sending message to PCP office for call back to pt with further recommendations. Alerted [facility] to ED refusal.     Symptoms: Hot flashes even at rest Lightheadedness and excessive sweating with hot flashes Chest pain - almost like gas pains a little bit stingy-er  Denies: Struggling to breathe No changes to awareness or speech Cold clammy skin, heart racing Feeling like going to pass out Chest pain longer than 5 min Nausea  Protocols used: Chest Pain-A-AH

## 2024-09-10 NOTE — Telephone Encounter (Signed)
 Lets proceed with that knee MRI

## 2024-09-10 NOTE — Addendum Note (Signed)
 Addended by: MARDY LEOTIS RAMAN on: 09/10/2024 11:29 AM   Modules accepted: Orders

## 2024-09-10 NOTE — Telephone Encounter (Signed)
 Called and spoke with pt, he is agreeable to proceeding with MRI of the left knee.   Order placed to [facility].

## 2024-09-11 NOTE — Telephone Encounter (Unsigned)
 Copied from CRM [id number]. Topic: Clinical - Medical Advice >> Sep 11, 2024 11:22 AM Burnard DEL wrote: Reason for CRM: Patient called in to office after eecieving a missed phone call. I advised patient that CMA was trying to co tact him to let him know that Dr Norleen suggest that he be evaluated in the ED as well. Patient stated that he will consider it but he do not trust [facility] ,because they are to damn slow,and if he have to die at home ,he will .He stated that they have almost let him die two times in the ED from being too slow to evaluate him. He said that the chest pains and hot flashes come and go.

## 2024-09-11 NOTE — Telephone Encounter (Signed)
 Ok this is noted, no new orders

## 2024-09-11 NOTE — Telephone Encounter (Signed)
 I agree, pt should go to ED, as I dont have other to offer without further information and exam   thanks

## 2024-09-11 NOTE — Telephone Encounter (Signed)
 Please inform the pt of the following per PCP orders... I agree, pt should go to ED, as I dont have other to offer without further information and exam   thanks

## 2024-09-13 ENCOUNTER — Other Ambulatory Visit: Payer: Self-pay

## 2024-09-13 ENCOUNTER — Other Ambulatory Visit: Payer: Self-pay | Admitting: Internal Medicine

## 2024-09-13 MED ORDER — ROSUVASTATIN CALCIUM 10 MG PO TABS: 10.0000 mg | ORAL_TABLET | Freq: Every day | ORAL | 0 refills | Status: AC

## 2024-09-13 MED ORDER — TRIAMTERENE-HCTZ 75-50 MG PO TABS: 1.0000 | ORAL_TABLET | Freq: Every day | ORAL | 0 refills | Status: AC

## 2024-09-13 NOTE — Telephone Encounter (Signed)
 Refill has been sent.

## 2024-09-13 NOTE — Telephone Encounter (Signed)
 Copied from CRM [id number]. Topic: Clinical - Medication Refill >> Sep 13, 2024  9:49 AM Treva T wrote: Medication: rosuvastatin  (CRESTOR ) 10 MG tablet  triamterene -hydrochlorothiazide (MAXZIDE) 75-50 MG tablet   Has the patient contacted their pharmacy? Yes, advised to contact office   This is the patient's preferred pharmacy:  [facility] [address] ([address] [place] [address] [place] ([place]) [place] [address] Phone: [phone] Fax: [phone]    Is this the correct pharmacy for this prescription? Yes   Has the prescription been filled recently? Yes  Is the patient out of the medication? Yes  Has the patient been seen for an appointment in the last year OR does the patient have an upcoming appointment? Yes  Can we respond through MyChart? No, prefers phone call at [phone]    Agent: Please be advised that Rx refills may take up to 3 business days. We ask that you follow-up with your pharmacy.

## 2024-10-02 ENCOUNTER — Ambulatory Visit
Admission: RE | Admit: 2024-10-02 | Discharge: 2024-10-02 | Disposition: A | Source: Ambulatory Visit | Attending: Family Medicine | Admitting: Family Medicine

## 2024-10-02 ENCOUNTER — Other Ambulatory Visit: Payer: Self-pay | Admitting: Internal Medicine

## 2024-10-02 DIAGNOSIS — G8929 Other chronic pain: Secondary | ICD-10-CM
